# Patient Record
Sex: Female | Born: 1935 | Race: White | Hispanic: No | Marital: Married | Smoking: Former smoker
Health system: Southern US, Community
[De-identification: ages and names within clinical notes are randomized; demographics above are authoritative.]

## PROBLEM LIST (undated history)

## (undated) DIAGNOSIS — K219 Gastro-esophageal reflux disease without esophagitis: Secondary | ICD-10-CM

## (undated) DIAGNOSIS — E559 Vitamin D deficiency, unspecified: Secondary | ICD-10-CM

## (undated) DIAGNOSIS — M7989 Other specified soft tissue disorders: Secondary | ICD-10-CM

## (undated) DIAGNOSIS — I10 Essential (primary) hypertension: Secondary | ICD-10-CM

## (undated) DIAGNOSIS — N83209 Unspecified ovarian cyst, unspecified side: Secondary | ICD-10-CM

## (undated) DIAGNOSIS — I82409 Acute embolism and thrombosis of unspecified deep veins of unspecified lower extremity: Secondary | ICD-10-CM

## (undated) DIAGNOSIS — K859 Acute pancreatitis without necrosis or infection, unspecified: Secondary | ICD-10-CM

## (undated) DIAGNOSIS — R195 Other fecal abnormalities: Secondary | ICD-10-CM

## (undated) DIAGNOSIS — F419 Anxiety disorder, unspecified: Secondary | ICD-10-CM

## (undated) DIAGNOSIS — E785 Hyperlipidemia, unspecified: Secondary | ICD-10-CM

## (undated) DIAGNOSIS — E119 Type 2 diabetes mellitus without complications: Secondary | ICD-10-CM

## (undated) DIAGNOSIS — E039 Hypothyroidism, unspecified: Secondary | ICD-10-CM

## (undated) DIAGNOSIS — M810 Age-related osteoporosis without current pathological fracture: Secondary | ICD-10-CM

## (undated) DIAGNOSIS — C50919 Malignant neoplasm of unspecified site of unspecified female breast: Secondary | ICD-10-CM

## (undated) DIAGNOSIS — I2510 Atherosclerotic heart disease of native coronary artery without angina pectoris: Secondary | ICD-10-CM

## (undated) DIAGNOSIS — M1990 Unspecified osteoarthritis, unspecified site: Secondary | ICD-10-CM

## (undated) DIAGNOSIS — E7800 Pure hypercholesterolemia, unspecified: Secondary | ICD-10-CM

## (undated) DIAGNOSIS — I350 Nonrheumatic aortic (valve) stenosis: Secondary | ICD-10-CM

## (undated) DIAGNOSIS — Z951 Presence of aortocoronary bypass graft: Secondary | ICD-10-CM

## (undated) DIAGNOSIS — I672 Cerebral atherosclerosis: Secondary | ICD-10-CM

## (undated) HISTORY — DX: Gastro-esophageal reflux disease without esophagitis: K21.9

## (undated) HISTORY — DX: Nonrheumatic aortic (valve) stenosis: I35.0

## (undated) HISTORY — PX: AORTIC VALVE REPLACEMENT: SHX41

## (undated) HISTORY — DX: Other specified soft tissue disorders: M79.89

## (undated) HISTORY — DX: Cerebral atherosclerosis: I67.2

## (undated) HISTORY — DX: Age-related osteoporosis without current pathological fracture: M81.0

## (undated) HISTORY — DX: Other fecal abnormalities: R19.5

## (undated) HISTORY — DX: Unspecified ovarian cyst, unspecified side: N83.209

## (undated) HISTORY — DX: Unspecified osteoarthritis, unspecified site: M19.90

## (undated) HISTORY — DX: Anxiety disorder, unspecified: F41.9

## (undated) HISTORY — DX: Atherosclerotic heart disease of native coronary artery without angina pectoris: I25.10

## (undated) HISTORY — DX: Pure hypercholesterolemia, unspecified: E78.00

## (undated) HISTORY — DX: Vitamin D deficiency, unspecified: E55.9

## (undated) HISTORY — DX: Acute embolism and thrombosis of unspecified deep veins of unspecified lower extremity: I82.409

## (undated) HISTORY — PX: LAPAROTOMY: SHX154

## (undated) HISTORY — DX: Essential (primary) hypertension: I10

## (undated) HISTORY — DX: Malignant neoplasm of unspecified site of unspecified female breast: C50.919

## (undated) HISTORY — DX: Hyperlipidemia, unspecified: E78.5

## (undated) HISTORY — DX: Type 2 diabetes mellitus without complications: E11.9

## (undated) HISTORY — PX: HIP ARTHROPLASTY: SHX981

## (undated) HISTORY — DX: Acute pancreatitis without necrosis or infection, unspecified: K85.90

## (undated) HISTORY — DX: Presence of aortocoronary bypass graft: Z95.1

## (undated) HISTORY — DX: Hypothyroidism, unspecified: E03.9

---

## 1977-05-11 HISTORY — PX: OTHER SURGICAL HISTORY: SHX169

## 1995-10-25 DIAGNOSIS — Z951 Presence of aortocoronary bypass graft: Secondary | ICD-10-CM

## 1995-10-25 HISTORY — DX: Presence of aortocoronary bypass graft: Z95.1

## 1995-10-25 HISTORY — PX: CORONARY ARTERY BYPASS GRAFT: SHX141

## 1997-08-20 ENCOUNTER — Other Ambulatory Visit: Admission: RE | Admit: 1997-08-20 | Discharge: 1997-08-20 | Payer: Self-pay | Admitting: Obstetrics and Gynecology

## 1998-09-05 ENCOUNTER — Other Ambulatory Visit: Admission: RE | Admit: 1998-09-05 | Discharge: 1998-09-05 | Payer: Self-pay | Admitting: Obstetrics and Gynecology

## 2000-08-12 ENCOUNTER — Encounter: Admission: RE | Admit: 2000-08-12 | Discharge: 2000-11-10 | Payer: Self-pay | Admitting: *Deleted

## 2000-08-18 ENCOUNTER — Encounter: Admission: RE | Admit: 2000-08-18 | Discharge: 2000-11-16 | Payer: Self-pay | Admitting: *Deleted

## 2000-11-30 ENCOUNTER — Other Ambulatory Visit: Admission: RE | Admit: 2000-11-30 | Discharge: 2000-11-30 | Payer: Self-pay | Admitting: Obstetrics and Gynecology

## 2001-03-21 ENCOUNTER — Encounter: Payer: Self-pay | Admitting: Cardiology

## 2001-03-21 ENCOUNTER — Ambulatory Visit (HOSPITAL_COMMUNITY): Admission: RE | Admit: 2001-03-21 | Discharge: 2001-03-21 | Payer: Self-pay | Admitting: Cardiology

## 2002-12-18 ENCOUNTER — Other Ambulatory Visit: Admission: RE | Admit: 2002-12-18 | Discharge: 2002-12-18 | Payer: Self-pay | Admitting: Internal Medicine

## 2004-06-13 HISTORY — PX: OTHER SURGICAL HISTORY: SHX169

## 2006-08-11 ENCOUNTER — Encounter: Admission: RE | Admit: 2006-08-11 | Discharge: 2006-08-11 | Payer: Self-pay | Admitting: Orthopedic Surgery

## 2010-01-16 ENCOUNTER — Encounter: Admission: RE | Admit: 2010-01-16 | Discharge: 2010-01-16 | Payer: Self-pay | Admitting: Cardiology

## 2012-03-24 ENCOUNTER — Other Ambulatory Visit (HOSPITAL_COMMUNITY): Payer: Self-pay | Admitting: *Deleted

## 2012-03-24 DIAGNOSIS — I359 Nonrheumatic aortic valve disorder, unspecified: Secondary | ICD-10-CM

## 2012-04-21 ENCOUNTER — Ambulatory Visit (HOSPITAL_COMMUNITY): Payer: Self-pay

## 2012-04-22 ENCOUNTER — Ambulatory Visit (HOSPITAL_COMMUNITY)
Admission: RE | Admit: 2012-04-22 | Discharge: 2012-04-22 | Disposition: A | Discharge status: Home / Self Care | Payer: Medicare Other | Source: Ambulatory Visit | Attending: Cardiovascular Disease | Admitting: Cardiovascular Disease

## 2012-04-22 DIAGNOSIS — I059 Rheumatic mitral valve disease, unspecified: Secondary | ICD-10-CM | POA: Insufficient documentation

## 2012-04-22 DIAGNOSIS — I2510 Atherosclerotic heart disease of native coronary artery without angina pectoris: Secondary | ICD-10-CM | POA: Insufficient documentation

## 2012-04-22 DIAGNOSIS — E785 Hyperlipidemia, unspecified: Secondary | ICD-10-CM | POA: Insufficient documentation

## 2012-04-22 DIAGNOSIS — I872 Venous insufficiency (chronic) (peripheral): Secondary | ICD-10-CM | POA: Insufficient documentation

## 2012-04-22 DIAGNOSIS — I079 Rheumatic tricuspid valve disease, unspecified: Secondary | ICD-10-CM | POA: Insufficient documentation

## 2012-04-22 DIAGNOSIS — I359 Nonrheumatic aortic valve disorder, unspecified: Secondary | ICD-10-CM | POA: Insufficient documentation

## 2012-04-22 DIAGNOSIS — I517 Cardiomegaly: Secondary | ICD-10-CM | POA: Insufficient documentation

## 2012-04-22 NOTE — Progress Notes (Signed)
[facility]   2D echo completed 04/22/2012.   Cindy Nayab Aten, RDCS   

## 2012-05-16 ENCOUNTER — Encounter (HOSPITAL_COMMUNITY): Payer: Self-pay | Admitting: Pharmacy Technician

## 2012-05-23 ENCOUNTER — Encounter (HOSPITAL_COMMUNITY)
Admission: RE | Disposition: A | Discharge status: Home / Self Care | Payer: Self-pay | Source: Ambulatory Visit | Attending: Cardiovascular Disease

## 2012-05-23 ENCOUNTER — Ambulatory Visit (HOSPITAL_COMMUNITY)
Admission: RE | Admit: 2012-05-23 | Discharge: 2012-05-23 | Disposition: A | Discharge status: Home / Self Care | Payer: Medicare Other | Source: Ambulatory Visit | Attending: Cardiovascular Disease | Admitting: Cardiovascular Disease

## 2012-05-23 DIAGNOSIS — I2789 Other specified pulmonary heart diseases: Secondary | ICD-10-CM | POA: Insufficient documentation

## 2012-05-23 DIAGNOSIS — I2510 Atherosclerotic heart disease of native coronary artery without angina pectoris: Secondary | ICD-10-CM | POA: Insufficient documentation

## 2012-05-23 DIAGNOSIS — I359 Nonrheumatic aortic valve disorder, unspecified: Secondary | ICD-10-CM | POA: Insufficient documentation

## 2012-05-23 DIAGNOSIS — I25810 Atherosclerosis of coronary artery bypass graft(s) without angina pectoris: Secondary | ICD-10-CM | POA: Insufficient documentation

## 2012-05-23 HISTORY — PX: LEFT AND RIGHT HEART CATHETERIZATION WITH CORONARY/GRAFT ANGIOGRAM: SHX5448

## 2012-05-23 LAB — POCT I-STAT 3, ART BLOOD GAS (G3+)
Acid-Base Excess: 1 mmol/L (ref 0.0–2.0)
O2 Saturation: 90 %
pO2, Arterial: 58 mmHg — ABNORMAL LOW (ref 80.0–100.0)

## 2012-05-23 LAB — POCT I-STAT 3, VENOUS BLOOD GAS (G3P V)
Acid-Base Excess: 1 mmol/L (ref 0.0–2.0)
Acid-base deficit: 1 mmol/L (ref 0.0–2.0)
O2 Saturation: 62 %
O2 Saturation: 73 %
pO2, Ven: 39 mmHg (ref 30.0–45.0)

## 2012-05-23 LAB — GLUCOSE, CAPILLARY
Glucose-Capillary: 94 mg/dL (ref 70–99)
Glucose-Capillary: 82 mg/dL (ref 70–99)

## 2012-05-23 LAB — PROTIME-INR
INR: 0.98 (ref 0.00–1.49)
Prothrombin Time: 12.9 seconds (ref 11.6–15.2)

## 2012-05-23 SURGERY — LEFT AND RIGHT HEART CATHETERIZATION WITH CORONARY/GRAFT ANGIOGRAM
Anesthesia: LOCAL

## 2012-05-23 MED ORDER — FENTANYL CITRATE 0.05 MG/ML IJ SOLN
INTRAMUSCULAR | Status: AC
  Filled 2012-05-23: qty 2

## 2012-05-23 MED ORDER — DIAZEPAM 5 MG PO TABS
5.0000 mg | ORAL_TABLET | Freq: Once | ORAL | Status: AC
  Administered 2012-05-23: 5 mg via ORAL

## 2012-05-23 MED ORDER — SODIUM CHLORIDE 0.9 % IJ SOLN: 3.0000 mL | INTRAMUSCULAR | Status: DC | PRN

## 2012-05-23 MED ORDER — HEPARIN (PORCINE) IN NACL 2-0.9 UNIT/ML-% IJ SOLN
INTRAMUSCULAR | Status: AC
  Filled 2012-05-23: qty 1000

## 2012-05-23 MED ORDER — ASPIRIN 81 MG PO CHEW
324.0000 mg | CHEWABLE_TABLET | ORAL | Status: AC
  Administered 2012-05-23: 324 mg via ORAL
  Filled 2012-05-23: qty 4

## 2012-05-23 MED ORDER — NITROGLYCERIN 0.2 MG/ML ON CALL CATH LAB
INTRAVENOUS | Status: AC
  Filled 2012-05-23: qty 1

## 2012-05-23 MED ORDER — DIAZEPAM 5 MG PO TABS
ORAL_TABLET | ORAL | Status: AC
  Filled 2012-05-23: qty 1

## 2012-05-23 MED ORDER — LIDOCAINE HCL (PF) 1 % IJ SOLN
INTRAMUSCULAR | Status: AC
  Filled 2012-05-23: qty 30

## 2012-05-23 MED ORDER — MIDAZOLAM HCL 2 MG/2ML IJ SOLN
INTRAMUSCULAR | Status: AC
  Filled 2012-05-23: qty 2

## 2012-05-23 MED ORDER — SODIUM CHLORIDE 0.9 % IV SOLN
INTRAVENOUS | Status: DC
  Administered 2012-05-23: 75 mL/h via INTRAVENOUS

## 2012-05-23 NOTE — CV Procedure (Signed)
Becky Mcmahon, [place] Female, 79 y.o., 1931/05/02  Location: [facility]  Bed: NONE  MRN: [id number]  CSN: [id number]  Admit Dt: 05/23/12  CARDIAC CATHETERIZATION REPORT   Procedures performed:  1. Left heart catheterization  2. Selective coronary angiography  3. Left ventriculography   Reason for procedure:  Severe aortic stenosis Coronary artery disease status post previous coronary bypass surgery  Procedure performed by: Thurmon Fair, MD, [facility]  Complications: none   Estimated blood loss: less than 5 mL   History:  The patient is a 79 year old woman who is now roughly 16 years status post multivessel coronary bypass surgery and presents with new onset dyspnea on exertion. She is found to have severe aortic stenosis by echocardiography.  Consent: The risks, benefits, and details of the procedure were explained to the patient. Risks including death, MI, stroke, bleeding, limb ischemia, renal failure and allergy were described and accepted by the patient. Informed written consent was obtained prior to proceeding.  Technique: The patient was brought to the cardiac catheterization laboratory in the fasting state. She was prepped and draped in the usual sterile fashion. Local anesthesia with 1% lidocaine was administered to the right groin area. Using the modified Seldinger technique a 5 French right common femoral artery sheath was introduced without difficulty. In a similar fashion a 7 [place] right common femoral vein sheath was also introduced. A 7 [place] balloontipped Swan-Ganz catheter was advanced under fluoroscopic guidance to the pulmonary artery and used to record pressures in the wedge position, main pulmonary artery, right ventricle and right atrium, respectively as well as to obtain blood samples for oxygen saturation from the pulmonary artery and right atrium. A synchronous sample of blood for oxygen saturation was obtained from the right femoral sheath Under fluoroscopic  guidance, using 5 [place] JL4 and JR  selective cannulation of the left coronary artery, right coronary artery, saphenous vein graft bypass to the right coronary artery, saphenous vein graft bypass to the oblique marginal artery , left internal mammary artery bypass to the LAD artery were respectively performed. Several coronary angiograms in a variety of projections were recorded. The aortic valve was crossed with the JR catheter and a straight tipped wire and a hand injection of the left ventricle is performed. Left ventricular pressure and a pull back to the aorta were recorded. No immediate complications occurred. At the end of the procedure, all catheters were removed. After the procedure, hemostasis will be achieved with manual pressure.  Contrast used: 85 mL Omnipaque  Hemodynamic findings:  Aortic pressure 140/67 (mean 97) mm Hg Left ventricle 194/13 with end-diastolic pressure of 20 mm Hg Pulmonary artery wedge position A wave 18, V wave 19, mean 15 mm Hg Pulmonary artery 41/17 (mean 27) mm Hg Right ventricle 39/6 with an end-diastolic pressure of 11 mm Hg Right atrium A-wave 13 V-wave 13 mean 11 mm Hg  Cardiac output is 4.42 L per minute (cardiac index 2.44 L per minute per meter sq) by the Fick equation. Cardiac output is 5.36 L per minute (cardiac index 2.96 L per minute per meter sq) by thermodilution  Aortic saturation 90%, PA saturation 62%.  By the Gorlin equation , the calculated aortic valve area 0.86 cm by the Fick equation, 0.71 cm square using thermodilution. Index for body surface area distress lates into 8 aortic valve area of 0.39-0.47 cm square per meter square body surface area. The peak to peak transvalvular gradient is 51 mm Hg, the mean transvalvular gradient is 35 mm Hg but these  values are probably underestimated due to the pullback technique.  The pulmonary vascular resistance is 179 DSC (324 DSCI).  Angiographic Findings:  1. The left main coronary artery is  heavily calcified and moderately diffusely diseased and bifurcates in the usual fashion into the left anterior descending artery and left circumflex coronary artery.  2. The left anterior descending artery is totally occluded following a very small first septal artery. There is evidence of extensive luminal irregularities and heavy calcification. The distal LAD artery fills via a left internal mammary artery bypass to provide both antegrade and retrograde flow only one major diagonal artery is seen filling in a retrograde fashion from the proximal LAD. Although the LAD artery is diffusely diseased, no discrete hemodynamically significant stenoses are seen. 3. The left circumflex coronary artery is a large-size vessel non- dominant vessel that generates three major oblique marginal arteries. There is evidence of extensive luminal irregularities and heavy calcification. A lengthy 70% proximal stenosis is seen. The first oblique marginal arteries by far the largest and receives most of its flow via a saphenous vein graft bypass. There is a 50% stenosis in the first oblique marginal artery but there is excellent retrograde flow into the remainder of the left circumflex system during injection of the vein graft. There is an approximately 50% stenosis in the AV groove vessel between the first and second oblique marginal arteries 4. The right coronary artery is a large-size dominant vessel that generates that is severely diffusely diseased and is totally occluded in its midportion. There is evidence of extensive luminal irregularities and heavy calcification. The posterior descending artery and posterolateral ventricular artery branches of the distal RCA filled via the saphenous vein graft bypass. Although they exhibit diffuse atherosclerosis that widely patent and of excellent flow. There is also retrograde flow to the mid distal native right coronary artery which fills a large right ventricular marginal branch  5.  the saphenous vein graft bypass to the right coronary artery shows excellent flow. It has a proximal 70% irregular stenosis but the remainder of the graft shows only mild luminal irregularities. 6. the saphenous vein graft bypass to the oblique marginal artery is widely patent shows excellent flow and it has mild to moderate luminal irregularities but no significant stenoses. 7. the left internal mammary artery bypass to the LAD artery is a excellent healthy-appearing conduit with brisk flow. It is relatively tortuous and approaches the left border of the sternum in its top third but does not appear to cross behind the sternum. 8. The left ventricle is normal in size. The left ventricle systolic function is normal with an estimated ejection fraction of 65-70 %. Regional wall motion abnormalities are not seen. No left ventricular thrombus is seen. There is no mitral insufficiency seen during the hand injection left ventriculogram. The ascending aorta appears normal. There is severe aortic valve stenosis by pullback. The aortic and left ventricular waveform showed a delayed peak    IMPRESSIONS:  Severe aortic stenosis. Severe coronary disease of the native coronary arteries with total occlusion of the LAD and right coronary arteries and high-grade stenosis of the proximal left circumflex coronary artery. Severe stenosis of the proximal saphenous vein graft to the right coronary artery but no significant stenoses in the other 2 bypass grafts. Mild pulmonary arterial hypertension.  RECOMMENDATION:  Mrs. Becky Mcmahon has symptomatic severe aortic stenosis with preserved left extrasystolic function and should undergo replacement of her aortic valve with a biological prosthesis.  She seems to be an appropriate  candidate for either surgical aortic valve replacement or a transaortic valvular replacement.  The latter approach is appealing in view of her age and the fact that it would provide optimal preservation of  her bypasses. The stenosis in the proximal saphenous vein graft to right coronary artery could then be treated with percutaneous revascularization and stenting.  On the other hand her functional status is good and the LIMA bypass seems to be relatively far from the midsternal line so that she may also be considered for surgical treatment . We'll refer Ms. Minturn to the valve clinic to discuss these options with Dr. Calton Dach and Dr. Tressie Stalker.     Thurmon Fair, MD, [facility] [phone] office [phone] pager 05/23/2012 12:36 PM

## 2012-05-23 NOTE — H&P (Addendum)
Date of Initial H&P: 05/16/2012  History reviewed, patient examined, no change in status, stable for surgery. Severe aortic stenosis, here for right and left heart catheterization with coronary angio and bypass graft angiography. This procedure has been fully reviewed with the patient and written informed consent has been obtained. Thurmon Fair, MD, [facility] [phone] office [phone] pager 05/23/2012 10:56 AM

## 2012-05-24 ENCOUNTER — Institutional Professional Consult (permissible substitution) (INDEPENDENT_AMBULATORY_CARE_PROVIDER_SITE_OTHER): Payer: Medicare Other | Admitting: Cardiothoracic Surgery

## 2012-05-24 ENCOUNTER — Other Ambulatory Visit: Payer: Self-pay

## 2012-05-24 ENCOUNTER — Ambulatory Visit: Payer: Medicare Other

## 2012-05-24 ENCOUNTER — Other Ambulatory Visit: Payer: Self-pay | Admitting: *Deleted

## 2012-05-24 VITALS — BP 113/65 | HR 77 | Resp 16 | Ht 65.0 in | Wt 163.0 lb

## 2012-05-24 DIAGNOSIS — Z951 Presence of aortocoronary bypass graft: Secondary | ICD-10-CM

## 2012-05-24 DIAGNOSIS — I359 Nonrheumatic aortic valve disorder, unspecified: Secondary | ICD-10-CM

## 2012-05-24 DIAGNOSIS — I2510 Atherosclerotic heart disease of native coronary artery without angina pectoris: Secondary | ICD-10-CM

## 2012-05-24 DIAGNOSIS — I350 Nonrheumatic aortic (valve) stenosis: Secondary | ICD-10-CM | POA: Insufficient documentation

## 2012-05-24 NOTE — Progress Notes (Signed)
PCP is Hoyle Sauer, MD Referring Provider is Croitoru, Rachelle Hora, MD  Chief Complaint  Patient presents with  . Aortic Stenosis    Eval for AVR, Cardiac Cath 05/23/12, Echo 04/22/12   . Coronary Artery Disease    HPI: 79-1/77[age]year-old Caucasian female with class III symptoms of CHF-shortness of breath following a large meal and walking in the cold weather cause symptoms- and recent diagnosis of severe aortic stenosis with a peak gradient by echo of 80 mm mercury and a calculated valve area of 0.5. Normal LV systolic function and no significant mitral valve disease. Maintaining normal sinus rhythm. The patient had multivessel bypass grafting in 1997 and has no symptoms recurrent angina. Cardiac catheterization completed for evaluation of her aortic stenosis demonstrated patent mammary and vein grafts with a proximal 70% narrowing of the vein graft to the occluded right coronary artery.  The patient's cardiologist wished that the patient  be evaluated for aortic valve replacement either with a conventional open sternotomy versus transarterial valve replacement-T. a VR. The patient denies any resting symptoms orthopnea PND or edema.  Since the patient's CABG in 1997 she has had DVT in both lower extremities and is on permanent Coumadin without evidence of disease. There is been no history of pulmonary emboli. She's had a broken hip-left hip. She has had no thoracic trauma. She has no history of significant peripheral vascular disease. She has had no significant problems with her chronic Coumadin therapy Septra 1 significant episode of epistaxis which required an emergency room visit and an evaluation by ENT which was nonrevealing. The patient lives a fairly active lifestyle with her husband. She has a remote history of breast cancer status post right mastectomy. Spirometry performed an office today indicates FEV1 of 1.6 and FVC of 3.3 both 90 percent predicted.  The patient has full dental plates, she  is edentulous, she denies any oral symptoms.    Past Medical History  Diagnosis Date  . Severe aortic stenosis 05/24/2012  . CAD (coronary artery disease) 05/24/2012  . Hyperlipidemia   . Osteoporosis   . Vitamin D deficiency   . Hypothyroidism   . GERD (gastroesophageal reflux disease)   . Hypertension   . Anxiety   . DVT (deep venous thrombosis)   . Pancreatitis   . Ovarian cyst   . Breast cancer     Rt mastectomy  . Guaiac positive stools     Past Surgical History  Procedure Date  . Coronary artery bypass graft 10/25/1995    x5 , Dr Donata Clay  . Right mastectomy 1979  . Laparotomy     Family History  Problem Relation Age of Onset  . Pancreatitis Mother   . Heart disease      family HX    Social History History  Substance Use Topics  . Smoking status: Former Smoker -- 0.5 packs/day for 19 years    Quit date: 05/24/1969  . Smokeless tobacco: Never Used  . Alcohol Use: No    Current Outpatient Prescriptions  Medication Sig Dispense Refill  . alendronate (FOSAMAX) 70 MG tablet Take 70 mg by mouth every 7 (seven) days. On Mondays; Take with a full glass of water on an empty stomach.      . Ascorbic Acid (VITAMIN C) 1000 MG tablet Take 1,000 mg by mouth daily.      Marland Kitchen aspirin EC 81 MG tablet Take 81 mg by mouth daily.      Marland Kitchen atorvastatin (LIPITOR) 20 MG tablet Take 20 mg  by mouth at bedtime.      . Calcium Carbonate-Vitamin D (CALCIUM 600 + D PO) Take 1 tablet by mouth daily.      . Cholecalciferol (VITAMIN D3) 2000 UNITS TABS Take 1 tablet by mouth daily.      Marland Kitchen enoxaparin (LOVENOX) 80 MG/0.8ML injection       . esomeprazole (NEXIUM) 40 MG capsule Take 40 mg by mouth daily before breakfast.      . levothyroxine (SYNTHROID, LEVOTHROID) 50 MCG tablet Take 50 mcg by mouth daily.      Marland Kitchen lisinopril (PRINIVIL,ZESTRIL) 10 MG tablet Take 10 mg by mouth daily.      Marland Kitchen LORazepam (ATIVAN) 0.5 MG tablet Take 0.25-0.5 mg by mouth 2 (two) times daily. For anxiety      .  metroNIDAZOLE (METROCREAM) 0.75 % cream Apply 1 application topically daily.      . Multiple Vitamin (MULTIVITAMIN WITH MINERALS) TABS Take 1 tablet by mouth daily.      Marland Kitchen warfarin (COUMADIN) 5 MG tablet Take 5 mg by mouth daily. Or as directed        Allergies  Allergen Reactions  . Other     Band Aids    Review of Systems Constitutional stable weight no fever no recent respiratory infection HEENT edentulous, no swallowing difficulty Thoracic no history of pulmonary nodule or abnormal chest x-ray Cardiac loud cardiac murmur class III symptoms of aortic stenosis-of CHF GI no history of GI bleeding jaundice hepatitis Endocrine negative for diabetes positive for elevated triglycerides Vascular no history of claudication positive history for bilateral leg DVT Neurologic no history stroke, TIA, syncope  BP 113/65  Pulse 77  Resp 16  Ht 5\' 5"  (1.651 m)  Wt 163 lb (73.936 kg)  BMI 27.12 kg/m2  SpO2 97% Physical Exam Gen. elderly but fairly bright alert and engaged HEENT normocephalic pupils equal edentulous Neck 1-2+ carotid pulses no JVD no bruit no adenopathy Thorax right mastectomy scar otherwise no deformity well-healed sternotomy breath sounds clear Cardiac grade 4/6 systolic ejection murmur regular rhythm no diastolic murmur Abdomen soft obese nontender without pulsatile mass Extremities no clubbing cyanosis or edema Vascular superficial venous changes, multiple vein harvest scars both legs minimal pedal edema 1+ pedal 2+ radial pulses Neurologic stage gait no focal motor deficit  Diagnostic Tests: Cardiac catheterization reviewed Transthoracic 2-D echocardiogram reviewed  Patent vein grafts left IMA to LAD, sequential saphenous vein graft to ramus intermediate and obtuse marginal, sequential vein graft to distal RCA and posterior descending Heavily calcified restrictive aortic valve, no clear heavy calcification of the ascending aorta  Impression:Plan The patient would  benefit from aortic valve replacement with probable combined-relation of the vein graft to the RCA. Even though the patient's overall functional level appears to be quite good for her age, redo sternotomy with combined aVR CABG in an 79 year old is an extremely high-risk procedure and by STS data exceeds 20%. I recommended patient undergo evaluation for T. aVR and she is also very interested as is her family. She will be referred to the T. aVR clinic and return here as needed

## 2012-05-26 ENCOUNTER — Ambulatory Visit (HOSPITAL_COMMUNITY)
Admission: RE | Admit: 2012-05-26 | Discharge: 2012-05-26 | Disposition: A | Discharge status: Home / Self Care | Payer: Medicare Other | Source: Ambulatory Visit | Attending: Thoracic Surgery (Cardiothoracic Vascular Surgery) | Admitting: Thoracic Surgery (Cardiothoracic Vascular Surgery)

## 2012-05-26 ENCOUNTER — Inpatient Hospital Stay (HOSPITAL_COMMUNITY)
Admission: RE | Admit: 2012-05-26 | Discharge: 2012-05-26 | Disposition: A | Discharge status: Home / Self Care | Payer: Medicare Other | Source: Ambulatory Visit

## 2012-05-26 DIAGNOSIS — I359 Nonrheumatic aortic valve disorder, unspecified: Secondary | ICD-10-CM | POA: Insufficient documentation

## 2012-05-26 DIAGNOSIS — Z01811 Encounter for preprocedural respiratory examination: Secondary | ICD-10-CM | POA: Insufficient documentation

## 2012-05-26 DIAGNOSIS — Z01818 Encounter for other preprocedural examination: Secondary | ICD-10-CM | POA: Insufficient documentation

## 2012-05-26 LAB — PULMONARY FUNCTION TEST

## 2012-05-26 MED ORDER — ALBUTEROL SULFATE (5 MG/ML) 0.5% IN NEBU
2.5000 mg | INHALATION_SOLUTION | Freq: Once | RESPIRATORY_TRACT | Status: AC
  Administered 2012-05-26: 2.5 mg via RESPIRATORY_TRACT

## 2012-05-27 ENCOUNTER — Encounter: Payer: Self-pay | Admitting: Thoracic Surgery (Cardiothoracic Vascular Surgery)

## 2012-05-27 ENCOUNTER — Institutional Professional Consult (permissible substitution) (INDEPENDENT_AMBULATORY_CARE_PROVIDER_SITE_OTHER): Payer: Medicare Other | Admitting: Thoracic Surgery (Cardiothoracic Vascular Surgery)

## 2012-05-27 VITALS — BP 128/73 | HR 90 | Resp 18 | Ht 65.0 in | Wt 163.0 lb

## 2012-05-27 DIAGNOSIS — E785 Hyperlipidemia, unspecified: Secondary | ICD-10-CM | POA: Insufficient documentation

## 2012-05-27 DIAGNOSIS — M1990 Unspecified osteoarthritis, unspecified site: Secondary | ICD-10-CM | POA: Insufficient documentation

## 2012-05-27 DIAGNOSIS — Z951 Presence of aortocoronary bypass graft: Secondary | ICD-10-CM

## 2012-05-27 DIAGNOSIS — I350 Nonrheumatic aortic (valve) stenosis: Secondary | ICD-10-CM

## 2012-05-27 DIAGNOSIS — I82409 Acute embolism and thrombosis of unspecified deep veins of unspecified lower extremity: Secondary | ICD-10-CM | POA: Insufficient documentation

## 2012-05-27 DIAGNOSIS — I359 Nonrheumatic aortic valve disorder, unspecified: Secondary | ICD-10-CM | POA: Insufficient documentation

## 2012-05-27 DIAGNOSIS — E11622 Type 2 diabetes mellitus with other skin ulcer: Secondary | ICD-10-CM | POA: Insufficient documentation

## 2012-05-27 DIAGNOSIS — I2510 Atherosclerotic heart disease of native coronary artery without angina pectoris: Secondary | ICD-10-CM

## 2012-05-27 DIAGNOSIS — I10 Essential (primary) hypertension: Secondary | ICD-10-CM | POA: Insufficient documentation

## 2012-05-27 NOTE — Progress Notes (Signed)
[facility]  MULTIDISCIPLINARY HEART VALVE CLINIC    CARDIOTHORACIC SURGERY CONSULTATION REPORT  Referring Provider is Thurmon Fair, MD PCP is Hoyle Sauer, MD  Chief Complaint  Patient presents with  . Aortic Stenosis    EVAL FOR TAVR, SEVERE AS    HPI:  Patient is a 79 year old married white female from [place] with long-standing history of aortic stenosis, coronary artery disease, hypertension, diet controlled type 2 diabetes mellitus, and arthritis. She underwent coronary artery bypass grafting x5 by Dr. Donata Clay in 1997 for severe 3-vessel CAD.  Postoperatively she did well from a cardiovascular standpoint although she did develop lower extremity deep venous thrombosis and pancreatitis during the first several months following her surgery. She later developed a second deep venous thrombosis in 2001 following hip surgery, and since then she has been treated with Coumadin ever since. From a cardiovascular standpoint the patient has done well. She has developed aortic stenosis which has been followed carefully over several years with repeat echocardiograms. Over the past year the patient has developed progressive symptoms of exertional shortness of breath and fatigue without chest pain. Followup echocardiogram demonstrates severe calcific aortic stenosis with peak velocity across the aortic valve measured close to 5 m/sec and peak and mean transvalvular gradients estimated 98 and 68 mmHg respectively. Left ventricular function is fairly well preserved with ejection fraction estimated 60 to 65% by echocardiogram.  Cardiac catheterization was performed 05/23/2012 and notable for the presence of severe three-vessel coronary artery disease but all previous bypass grafts remained patent. There is 70% proximal stenosis of the vein graft to right coronary system, but all other grafts are widely patent and free of significant disease.  Peak to peak and mean pullback gradients  across the aortic valve were measured 51 mmHg and 35 mmHg, respectively.  Pulmonary artery pressures measured 41/17 and calculated aortic valve area was estimated between 0.71 and 0.86 cm.  The patient was referred to consider treatment options and has been seen in consultation earlier this week by Dr. Donata Clay who feels that she would be a relatively poor candidate for conventional aortic valve replacement with or without redo coronary artery bypass grafting. The patient has been referred to consider transcatheter aortic valve replacement as an alternative.  The patient describes chronic symptoms of exertional shortness of breath that has gradually progressed over the past couple of years. She states that she now get short of breath with moderate physical activity, probably functional class II to class III.  The patient denies resting shortness of breath, PND, orthopnea, or lower extremity edema. She has never had any chest pain or chest tightness. She recently has had a few dizzy spells without syncope.  She also suffers from chronic arthritis, but she states that her physical activity is primarily limited by shortness of breath.  Past Medical History  Diagnosis Date  . Severe aortic stenosis   . CAD (coronary artery disease)   . Hyperlipidemia   . Osteoporosis   . Vitamin D deficiency   . Hypothyroidism   . GERD (gastroesophageal reflux disease)   . Hypertension   . Anxiety   . Pancreatitis   . Ovarian cyst   . Breast cancer     Rt mastectomy  . Guaiac positive stools   . S/P CABG x 5 10/25/1995    LIMA to LAD, sequential SVG to RI and OM, sequential SVG to RCA and PDA, open vein harvest from both thighs and legs - Dr Donata Clay  .  Type II diabetes mellitus     Diet controlled  . Arthritis   . Hypercholesteremia   . DVT (deep venous thrombosis)     Recurrent, 1st episode after CABG 1997, 2nd episode after hip surgery 2001, placed on life-long coumadin ever since    Past Surgical  History  Procedure Date  . Coronary artery bypass graft 10/25/1995    x5 , Dr Donata Clay  . Right mastectomy 1979  . Laparotomy   . Hip arthroplasty     right    Family History  Problem Relation Age of Onset  . Pancreatitis Mother   . Heart disease Father     family HX    History   Social History  . Marital Status: Married    Spouse Name: N/A    Number of Children: N/A  . Years of Education: N/A   Occupational History  . retired grade school teacher [employer]   Social History Main Topics  . Smoking status: Former Smoker -- 0.5 packs/day for 19 years    Quit date: 05/24/1969  . Smokeless tobacco: Never Used  . Alcohol Use: No  . Drug Use: Not on file  . Sexually Active: Not on file   Other Topics Concern  . Not on file   Social History Narrative   Lives with husband and 1 daughter, 2nd daughter [place] in [place] physically active and functionally independentActive in church    Current Outpatient Prescriptions  Medication Sig Dispense Refill  . alendronate (FOSAMAX) 70 MG tablet Take 70 mg by mouth every 7 (seven) days. On Mondays; Take with a full glass of water on an empty stomach.      . Ascorbic Acid (VITAMIN C) 1000 MG tablet Take 1,000 mg by mouth daily.      Marland Kitchen aspirin EC 81 MG tablet Take 81 mg by mouth daily.      Marland Kitchen atorvastatin (LIPITOR) 20 MG tablet Take 20 mg by mouth at bedtime.      . Calcium Carbonate-Vitamin D (CALCIUM 600 + D PO) Take 1 tablet by mouth daily.      . Cholecalciferol (VITAMIN D3) 2000 UNITS TABS Take 1 tablet by mouth daily.      Marland Kitchen enoxaparin (LOVENOX) 80 MG/0.8ML injection       . esomeprazole (NEXIUM) 40 MG capsule Take 40 mg by mouth daily before breakfast.      . levothyroxine (SYNTHROID, LEVOTHROID) 50 MCG tablet Take 50 mcg by mouth daily.      Marland Kitchen lisinopril (PRINIVIL,ZESTRIL) 10 MG tablet Take 10 mg by mouth daily.      Marland Kitchen LORazepam (ATIVAN) 0.5 MG tablet Take 0.25-0.5 mg by mouth 2 (two) times daily. For  anxiety      . metroNIDAZOLE (METROCREAM) 0.75 % cream Apply 1 application topically daily.      . Multiple Vitamin (MULTIVITAMIN WITH MINERALS) TABS Take 1 tablet by mouth daily.      Marland Kitchen warfarin (COUMADIN) 5 MG tablet Take 5 mg by mouth daily. Or as directed        Allergies  Allergen Reactions  . Other     Band Aids    Review of Systems:   General:  normal appetite, decreased energy, no weight gain/loss  HEENT:  no blurry vision, no recent vision changes, no epistaxis, no hearing loss, no loose teeth or painful teeth, full set dentures  Respiratory:  + exertional shortness of breath, no cough, no wheezing, no hemoptysis, no pain with inspiration or  cough  Cardiac:  no chest pain with/without exertion, SOB with moderate exertion, no resting SOB, no PND, no orthopnea, occasional LE edema, no palpitations, no arrhythmia, + dizzy spells, no syncope  Vascular:  no pain suggestive of claudication  GI:   no difficulty swallowing, + heartburn/reflux, no hematochezia, no hematemesis, no melena, + constipation, no diarrhea   GU:   no dysuria, no urgency, no frequency  Musculoskeletal: + arthritis, no myalgias, mobility mildly limited, minimal difficulty walking  Neuro:   no symptoms suggestive of TIA's, no seizures, no headaches, no peripheral neuropathy, no instability of gait, no memory/cognitive dysfunction  Endocrine:  + diet-controlled diabetes, + thyroid dysfunction  Hematologic:  + easy bruising, no anemia, no abnormal bleeding  Psych:   + anxiety, no depression     Physical Exam:   BP 128/73  Pulse 90  Resp 18  Ht 5\' 5"  (1.651 m)  Wt 163 lb (73.936 kg)  BMI 27.12 kg/m2  SpO2 97%  General:  Elderly but o/w well-appearing  HEENT:  Unremarkable   Neck:   no JVD, no bruits, no adenopathy   Chest:   clear to auscultation, symmetrical breath sounds, no wheezes, no rhonchi   CV:   RRR, grade III/VI crescendo/decrescendo murmur heard best at RUSB,  no diastolic  murmur  Abdomen:  soft, non-tender, no masses   Extremities:  warm, well-perfused, pulses palpable in groin but not at ankle, scars both thighs and lower legs from SVG harvest, + varicose veins, no LE edema  Rectal/GU  Deferred  Neuro:   Grossly non-focal and symmetrical throughout  Skin:   Clean and dry, no rashes, no breakdown   Diagnostic Tests:  Transthoracic Echocardiography  Patient:    Zahrah, Sutherlin MR #:       [id number] Study Date: 04/22/2012 Gender:     F Age:        82 Height:     165.1cm Weight:     75.8kg BSA:        1.3m^2 Pt. Status: Room:    SONOGRAPHER  Rigg, Aram Beecham  ATTENDING    Croitoru, Mihai  ORDERING     Croitoru, Mihai  REFERRING    Croitoru, Mihai  PERFORMING   [facility] cc:  ------------------------------------------------------------ LV EF: 60% -   65%  ------------------------------------------------------------ Indications:      Aortic stenosis 424.1.  ------------------------------------------------------------ History:   PMH:   Coronary artery disease.  Aortic valve disease.  Risk factors:  Peripheral venous insufficiency. Dyslipidemia.  ------------------------------------------------------------ Study Conclusions  - Left ventricle: The cavity size was normal. There was   moderate concentric hypertrophy. Systolic function was   normal. The estimated ejection fraction was in the range   of 60% to 65%. Wall motion was normal; there were no   regional wall motion abnormalities. Doppler parameters are   consistent with abnormal left ventricular relaxation   (grade 1 diastolic dysfunction). The E/e'' ratio is >10,   suggesting elevated LV filling pressure. - Aortic valve: Heavily calcified with restricted leaflet   motion. Severe aortic stenosis. Peak and mean gradients of   98 mmHg and 68 mmHG, respectively. Based on an LVOT   diameter of 2.0 cm, the calculated AVA is ~0.5 cm2. There   is no significant AI. Valve area:  0.58cm^2(VTI). Valve   area: 0.54cm^2 (Vmax). - Mitral valve: Calcified annulus. Mild regurgitation. Valve   area by pressure half-time: 2.37cm^2. Valve area by   continuity equation (using LVOT flow): 1.94cm^2. - Left atrium: Severely  dilated. LA Volume/BSA 40.4 ml/m2. - Tricuspid valve: Mild regurgitation. - Pulmonary arteries: PA peak pressure: 29mm Hg (S). - Inferior vena cava: The vessel was normal in size; the   respirophasic diameter changes were in the normal range (=   50%); findings are consistent with normal central venous   pressure.  ------------------------------------------------------------ Labs, prior tests, procedures, and surgery: Coronary artery bypass grafting.  Transthoracic echocardiography.  M-mode, complete 2D, spectral Doppler, and color Doppler.  Height:  Height: 165.1cm. Height: 65in.  Weight:  Weight: 75.8kg. Weight: 166.7lb.  Body mass index:  BMI: 27.8kg/m^2.  Body surface area:    BSA: 1.72m^2.  Blood pressure:     136/78.  Patient status:  Outpatient.  Location:  Echo laboratory.  ------------------------------------------------------------  ------------------------------------------------------------ Left ventricle:  The cavity size was normal. There was moderate concentric hypertrophy. Systolic function was normal. The estimated ejection fraction was in the range of 60% to 65%. Wall motion was normal; there were no regional wall motion abnormalities. Doppler parameters are consistent with abnormal left ventricular relaxation (grade 1 diastolic dysfunction). The E/e'' ratio is >10, suggesting elevated LV filling pressure.  ------------------------------------------------------------ Aortic valve:  Heavily calcified with restricted leaflet motion. Severe aortic stenosis. Peak and mean gradients of 98 mmHg and 68 mmHG, respectively. Based on an LVOT diameter of 2.0 cm, the calculated AVA is ~0.5 cm2. There is no significant AI.  Doppler:      Valve area: 0.58cm^2(VTI). Indexed valve area: 0.32cm^2/m^2 (VTI). Valve area: 0.54cm^2 (Vmax). Indexed valve area: 0.3cm^2/m^2 (Vmax).    Mean gradient: 68mm Hg (S). Peak gradient: 98mm Hg (S).  ------------------------------------------------------------ Aorta:  Aortic root: The aortic root was normal in size. Ascending aorta: The ascending aorta was normal in size.  ------------------------------------------------------------ Mitral valve:   Calcified annulus.  Doppler:   Mild regurgitation.    Valve area by pressure half-time: 2.37cm^2. Indexed valve area by pressure half-time: 1.3cm^2/m^2. Valve area by continuity equation (using LVOT flow): 1.94cm^2. Indexed valve area by continuity equation (using LVOT flow): 1.06cm^2/m^2.    Mean gradient: 3mm Hg (D). Peak gradient: 6mm Hg (D).  ------------------------------------------------------------ Left atrium:  Severely dilated. LA Volume/BSA 40.4 ml/m2.   ------------------------------------------------------------ Atrial septum:  No defect or patent foramen ovale was identified.  ------------------------------------------------------------ Right ventricle:  The cavity size was normal. Wall thickness was normal. The moderator band was prominent. Systolic function was normal.  ------------------------------------------------------------ Pulmonic valve:    The valve appears to be grossly normal.  Doppler:   No significant regurgitation.  ------------------------------------------------------------ Tricuspid valve:   Doppler:   Mild regurgitation.  ------------------------------------------------------------ Pulmonary artery:   Poorly visualized.  ------------------------------------------------------------ Right atrium:  The atrium was normal in size.  ------------------------------------------------------------ Pericardium:  There was no pericardial  effusion.  ------------------------------------------------------------ Systemic veins: Inferior vena cava: The vessel was normal in size; the respirophasic diameter changes were in the normal range (= 50%); findings are consistent with normal central venous pressure.  ------------------------------------------------------------  2D measurements        Normal  Doppler measurements   Normal Left ventricle                 Main pulmonary LVID ED,   43.3 mm     43-52   artery chord,                         Pressure,    29 mm Hg  =30 PLAX  S LVID ES,   28.9 mm     23-38   Left ventricle chord,                         Ea, lat     8.3 cm/s   ------ PLAX                           ann, tiss FS, chord,   33 %      >29     DP PLAX                           E/Ea, lat  11.3        ------ LVPW, ED   13.3 mm     ------  ann, tiss IVS/LVPW   0.92        <1.3    DP ratio, ED                      Ea, med       8 cm/s   ------ Ventricular septum             ann, tiss IVS, ED    12.2 mm     ------  DP LVOT                           E/Ea, med  11.7        ------ Diam, S      20 mm     ------  ann, tiss     3 Area       3.14 cm^2   ------  DP Aorta                          Aortic valve Root diam,   32 mm     ------  Peak vel,   496 cm/s   ------ ED                             S Left atrium                    Mean vel,   397 cm/s   ------ AP dim       38 mm     ------  S AP dim     2.08 cm/m^2 <2.2    VTI, S      137 cm     ------ index                          Mean         68 mm Hg  ------                                gradient,                                S                                Peak  98 mm Hg  ------                                gradient,                                S                                Area, VTI  0.58 cm^2   ------                                Area index 0.32 cm^2/m ------                                (VTI)           ^2                                 Area, Vmax 0.54 cm^2   ------                                Area index  0.3 cm^2/m ------                                (Vmax)          ^2                                Mitral valve                                Peak E vel 93.8 cm/s   ------                                Peak A vel  120 cm/s   ------                                Mean vel,  75.7 cm/s   ------                                D                                Decelerati  271 ms     150-23                                on time                0  Pressure     93 ms     ------                                half-time                                Mean          3 mm Hg  ------                                gradient,                                D                                Peak          6 mm Hg  ------                                gradient,                                D                                Peak E/A    0.8        ------                                ratio                                Area (PHT) 2.37 cm^2   ------                                Area index  1.3 cm^2/m ------                                (PHT)           ^2                                Area       1.94 cm^2   ------                                (LVOT)                                continuity                                Area index 1.06  cm^2/m ------                                (LVOT           ^2                                cont)                                Annulus    41.2 cm     ------                                VTI                                Tricuspid valve                                Regurg      246 cm/s   ------                                peak vel                                Peak RV-RA   24 mm Hg  ------                                gradient,                                S                                Max regurg  246 cm/s    ------                                vel                                Systemic veins                                Estimated     5 mm Hg  ------                                CVP                                Right ventricle  Pressure,    29 mm Hg  <30                                S                                Sa vel,    10.8 cm/s   ------                                lat ann,                                tiss DP   ------------------------------------------------------------ Prepared and Electronically Authenticated by  Rennis Golden, [place] 2013-12-15T10:18:28.197  CARDIAC CATHETERIZATION REPORT    Procedures performed:   1. Left heart catheterization   2. Selective coronary angiography   3. Left ventriculography   Reason for procedure:   Severe aortic stenosis Coronary artery disease status post previous coronary bypass surgery  Procedure performed by: Thurmon Fair, MD, [facility]  Complications: none   Estimated blood loss: less than 5 mL   History:  The patient is a 79 year old woman who is now roughly 16 years status post multivessel coronary bypass surgery and presents with new onset dyspnea on exertion. She is found to have severe aortic stenosis by echocardiography.  Consent: The risks, benefits, and details of the procedure were explained to the patient. Risks including death, MI, stroke, bleeding, limb ischemia, renal failure and allergy were described and accepted by the patient. Informed written consent was obtained prior to proceeding.  Technique: The patient was brought to the cardiac catheterization laboratory in the fasting state. She was prepped and draped in the usual sterile fashion. Local anesthesia with 1% lidocaine was administered to the right groin area. Using the modified Seldinger technique a 5 French right common femoral artery sheath was introduced without difficulty. In a similar fashion a 7 [place] right common femoral  vein sheath was also introduced. A 7 [place] balloontipped Swan-Ganz catheter was advanced under fluoroscopic guidance to the pulmonary artery and used to record pressures in the wedge position, main pulmonary artery, right ventricle and right atrium, respectively as well as to obtain blood samples for oxygen saturation from the pulmonary artery and right atrium. A synchronous sample of blood for oxygen saturation was obtained from the right femoral sheath Under fluoroscopic guidance, using 5 [place] JL4 and JR selective cannulation of the left coronary artery, right coronary artery, saphenous vein graft bypass to the right coronary artery, saphenous vein graft bypass to the oblique marginal artery , left internal mammary artery bypass to the LAD artery were respectively performed. Several coronary angiograms in a variety of projections were recorded. The aortic valve was crossed with the JR catheter and a straight tipped wire and a hand injection of the left ventricle is performed. Left ventricular pressure and a pull back to the aorta were recorded. No immediate complications occurred. At the end of the procedure, all catheters were removed. After the procedure, hemostasis will be achieved with manual pressure.  Contrast used: 85 mL Omnipaque  Hemodynamic findings:  Aortic pressure 140/67 (mean 97) mm Hg Left ventricle 194/13 with end-diastolic pressure of 20 mm Hg Pulmonary artery wedge position A wave 18, V wave 19, mean  15 mm Hg Pulmonary artery 41/17 (mean 27) mm Hg Right ventricle 39/6 with an end-diastolic pressure of 11 mm Hg Right atrium A-wave 13 V-wave 13 mean 11 mm Hg  Cardiac output is 4.42 L per minute (cardiac index 2.44 L per minute per meter sq) by the Fick equation. Cardiac output is 5.36 L per minute (cardiac index 2.96 L per minute per meter sq) by thermodilution  Aortic saturation 90%, PA saturation 62%.  By the Gorlin equation , the calculated aortic valve area 0.86 cm by the  Fick equation, 0.71 cm square using thermodilution. Index for body surface area distress lates into 8 aortic valve area of 0.39-0.47 cm square per meter square body surface area. The peak to peak transvalvular gradient is 51 mm Hg, the mean transvalvular gradient is 35 mm Hg but these values are probably underestimated due to the pullback technique.  The pulmonary vascular resistance is 179 DSC (324 DSCI).  Angiographic Findings:   1. The left main coronary artery is heavily calcified and moderately diffusely diseased and bifurcates in the usual fashion into the left anterior descending artery and left circumflex coronary artery.   2. The left anterior descending artery is totally occluded following a very small first septal artery. There is evidence of extensive luminal irregularities and heavy calcification. The distal LAD artery fills via a left internal mammary artery bypass to provide both antegrade and retrograde flow only one major diagonal artery is seen filling in a retrograde fashion from the proximal LAD. Although the LAD artery is diffusely diseased, no discrete hemodynamically significant stenoses are seen. 3. The left circumflex coronary artery is a large-size vessel non- dominant vessel that generates three major oblique marginal arteries. There is evidence of extensive luminal irregularities and heavy calcification. A lengthy 70% proximal stenosis is seen. The first oblique marginal arteries by far the largest and receives most of its flow via a saphenous vein graft bypass. There is a 50% stenosis in the first oblique marginal artery but there is excellent retrograde flow into the remainder of the left circumflex system during injection of the vein graft. There is an approximately 50% stenosis in the AV groove vessel between the first and second oblique marginal arteries 4. The right coronary artery is a large-size dominant vessel that generates that is severely diffusely diseased and is  totally occluded in its midportion. There is evidence of extensive luminal irregularities and heavy calcification. The posterior descending artery and posterolateral ventricular artery branches of the distal RCA filled via the saphenous vein graft bypass. Although they exhibit diffuse atherosclerosis that widely patent and of excellent flow. There is also retrograde flow to the mid distal native right coronary artery which fills a large right ventricular marginal branch   5. the saphenous vein graft bypass to the right coronary artery shows excellent flow. It has a proximal 70% irregular stenosis but the remainder of the graft shows only mild luminal irregularities. 6. the saphenous vein graft bypass to the oblique marginal artery is widely patent shows excellent flow and it has mild to moderate luminal irregularities but no significant stenoses. 7. the left internal mammary artery bypass to the LAD artery is a excellent healthy-appearing conduit with brisk flow. It is relatively tortuous and approaches the left border of the sternum in its top third but does not appear to cross behind the sternum. 8. The left ventricle is normal in size. The left ventricle systolic function is normal with an estimated ejection fraction of 65-70 %. Regional  wall motion abnormalities are not seen. No left ventricular thrombus is seen. There is no mitral insufficiency seen during the hand injection left ventriculogram. The ascending aorta appears normal. There is severe aortic valve stenosis by pullback. The aortic and left ventricular waveform showed a delayed peak     IMPRESSIONS:   Severe aortic stenosis. Severe coronary disease of the native coronary arteries with total occlusion of the LAD and right coronary arteries and high-grade stenosis of the proximal left circumflex coronary artery. Severe stenosis of the proximal saphenous vein graft to the right coronary artery but no significant stenoses in the other 2 bypass  grafts. Mild pulmonary arterial hypertension.   RECOMMENDATION:   Mrs. Becky Mcmahon has symptomatic severe aortic stenosis with preserved left extrasystolic function and should undergo replacement of her aortic valve with a biological prosthesis.   She seems to be an appropriate candidate for either surgical aortic valve replacement or a transaortic valvular replacement.   The latter approach is appealing in view of her age and the fact that it would provide optimal preservation of her bypasses. The stenosis in the proximal saphenous vein graft to right coronary artery could then be treated with percutaneous revascularization and stenting.   On the other hand her functional status is good and the LIMA bypass seems to be relatively far from the midsternal line so that she may also be considered for surgical treatment . We'll refer Ms. Ring to the valve clinic to discuss these options with Dr. Calton Dach and Dr. Tressie Stalker.         Pulmonary Function Tests  Baseline                                                                      Post-bronchodilator  FVC                 2.24 L  (89% predicted)          FVC                 2.26 L  (90% predicted) FEV1               1.57 L  (84% predicted)          FEV1               1.64 L  (88% predicted) FEF25-75        0.92 L  (67% predicted)          FEF25-75        1.13 L  (82% predicted)  RV                   2.01 L  (86% predicted)  6 Minute Walk Test Results  Patient:            ANNAROSE OUELLET Date:               05/27/2012   Supplemental O2 during test?            NO  Baseline                                 End  Time                            1330                                        1336 Heartrate                     72                                            100 Dyspnea                      MILD                                        MODERATE Fatigue                        MILD                                         MODERATE O2 sat                         99%                                         98% Blood pressure           113/72                                     122/64   Patient ambulated at a SLOW pace for a total distance of 400 feet with 1 stop.  Ambulation was limited primarily due to shortness of breath and decreased saturations.  Overall the test was tolerated fairly.   STS Risk Calculator  Procedure                                         AVREPL+REDOCABG  Risk of Mortality                                8.348% Morbidity or Mortality                       31.983% Prolonged LOS  18.559% Short LOS                                           13.441% Permanent Stroke                            3.382% Prolonged Vent Support                      28.925% DSW Infection                                     0.782% Renal Failure                                       8.834% Reoperation                                        10.534%   Impression:  The patient has severe symptomatic aortic stenosis with underlying history of coronary artery disease, status post coronary artery bypass grafting in 1997. All previous bypass grafts are patent and functional with only moderate stenosis in the proximal segment of one vein graft which does not seem to be symptomatic at this time. Left ventricular function remains fairly well preserved. The patient does suffer from arthritis but overall remains fairly active physically with her primary limitation at this time remaining exertional shortness of breath. Risks associated with conventional aortic valve replacement with or without redo coronary artery bypass grafting would be somewhat elevated because of the patient's advanced age and mild frailty. I agree that transcatheter aortic valve replacement might be an reasonable alternative to conventional surgery under the circumstances.   Plan:  The  patient was counseled at length regarding treatment alternatives for management of severe symptomatic aortic stenosis. The high-risk nature of surgical intervention has been discussed in detail. Long-term prognosis with medical therapy was discussed. Alternative approaches such as conventional aortic valve replacement, transcatheter aortic valve replacement, and palliative medical therapy were compared and contrasted at length. This discussion was placed in the context of the patient's own specific clinical presentation and past medical history.  The patient would need cardiac gated CT angiogram of the heart as well as CT angiogram of the abdomen and pelvis to further assess the feasibility of transcatheter aortic valve replacement and examine access options. The patient and her family want to discuss matters further before proceeding any further.  We will plan to see her back in the multidisciplinary valve clinic in 2 weeks if desired.     Salvatore Decent. Cornelius Moras, MD 05/27/2012 12:45 PM

## 2012-05-27 NOTE — Progress Notes (Deleted)
Pulmonary Function Tests  Baseline      Post-bronchodilator  FVC  2.24 L  (89% predicted) FVC  2.26 L  (90% predicted) FEV1  1.57 L  (84% predicted) FEV1  1.64 L  (88% predicted) FEF25-75 0.92 L  (67% predicted) FEF25-75 1.13 L  (82% predicted)  RV  2.01 L  (86% predicted)  6 Minute Walk Test Results  Patient: Becky Mcmahon Date:  05/27/2012   Supplemental O2 during test? NO      Baseline   End  Time   1330    1336 Heartrate  72    100 Dyspnea  MILD    MODERATE Fatigue  MILD    MODERATE O2 sat   99%    98% Blood pressure 113/72    122/64   Patient ambulated at a SLOW pace for a total distance of 400 feet with 1 stop.  Ambulation was limited primarily due to shortness of breath and decreased saturations.  Overall the test was tolerated fairly.  STS Risk Calculator  Procedure    AVREPL+REDOCABG  Risk of Mortality   8.348% Morbidity or Mortality  31.983% Prolonged LOS   18.559% Short LOS    13.441% Permanent Stroke   3.382% Prolonged Vent Support  28.925% DSW Infection    0.782% Renal Failure    8.834% Reoperation    10.534%    Review of Systems:   General:  NO loss of appetite, YES decreased energy, NO weight gain, NO weight loss, NO fever  Cardiac:  NO chest pain with exertion, NO chest pain at rest, YES SOB with  exertion, NO resting SOB, NO PND, NO orthopnea, NO palpitations, NO arrhythmia, NO atrial fibrillation, YES LE edema, NO dizzy spells, NO syncope  Respiratory:  YES shortness of breath, NO home oxygen, NO productive cough, NO dry cough, NO bronchitis, NO wheezing, NO hemoptysis, NO asthma, NO pain with inspiration or cough, NO sleep apnea, NO CPAP at night  GI:   NO difficulty swallowing, YES reflux, YES frequent heartburn, YES hiatal hernia, NO abdominal pain, NO constipation, NO diarrhea, NO hematochezia, NO hematemesis, NO melena  GU:   NO dysuria,  NO frequency, NO urinary tract infection, YES hematuria, NO enlarged prostate, NO kidney  stones, NO kidney disease  Vascular:  NO pain suggestive of claudication, NO pain in feet, YES leg cramps, YES varicose veins, NO DVT, NO non-healing foot ulcer  Neuro:   NO stroke, NO TIA's, NO seizures, NO headaches, NOtemporary blindness one eye,  NO slurred speech, NO peripheral neuropathy, NO chronic pain, NO instability of gait, NO memory/cognitive dysfunction  Musculoskeletal: YES arthritis, NO joint swelling, YES myalgias, YES difficulty walking   Skin:   YES rash, YES itching, NO skin infections, NO pressure sores or ulcerations  Psych:   YES anxiety, NO depression, NO nervousness, NO unusual recent stress  Eyes:   NO blurry vision, NO floaters, NO recent vision changes, NO wears glasses or contacts  ENT:   YES hearing loss, NO loose or painful teeth, YES dentures, last saw dentist 2013  Hematologic:  YES easy bruising, NO abnormal bleeding, NOclotting disorder, NO frequent epistaxis  Endocrine:  YES diabetes, does check CBG's at home YES

## 2012-06-25 ENCOUNTER — Other Ambulatory Visit: Payer: Self-pay

## 2012-08-08 DIAGNOSIS — K219 Gastro-esophageal reflux disease without esophagitis: Secondary | ICD-10-CM | POA: Insufficient documentation

## 2012-08-08 DIAGNOSIS — C50919 Malignant neoplasm of unspecified site of unspecified female breast: Secondary | ICD-10-CM | POA: Insufficient documentation

## 2012-08-08 DIAGNOSIS — E039 Hypothyroidism, unspecified: Secondary | ICD-10-CM | POA: Insufficient documentation

## 2012-08-08 DIAGNOSIS — M810 Age-related osteoporosis without current pathological fracture: Secondary | ICD-10-CM | POA: Insufficient documentation

## 2012-10-13 ENCOUNTER — Ambulatory Visit (INDEPENDENT_AMBULATORY_CARE_PROVIDER_SITE_OTHER): Payer: Medicare Other | Admitting: Cardiovascular Disease

## 2012-10-13 ENCOUNTER — Encounter: Payer: Self-pay | Admitting: Cardiovascular Disease

## 2012-10-13 VITALS — BP 140/70 | HR 57 | Resp 16 | Ht 66.0 in | Wt 169.1 lb

## 2012-10-13 DIAGNOSIS — I359 Nonrheumatic aortic valve disorder, unspecified: Secondary | ICD-10-CM

## 2012-10-13 DIAGNOSIS — I10 Essential (primary) hypertension: Secondary | ICD-10-CM

## 2012-10-13 DIAGNOSIS — Z952 Presence of prosthetic heart valve: Secondary | ICD-10-CM

## 2012-10-13 DIAGNOSIS — Z954 Presence of other heart-valve replacement: Secondary | ICD-10-CM

## 2012-10-13 DIAGNOSIS — I2510 Atherosclerotic heart disease of native coronary artery without angina pectoris: Secondary | ICD-10-CM

## 2012-10-13 DIAGNOSIS — I82409 Acute embolism and thrombosis of unspecified deep veins of unspecified lower extremity: Secondary | ICD-10-CM

## 2012-10-13 DIAGNOSIS — Z951 Presence of aortocoronary bypass graft: Secondary | ICD-10-CM

## 2012-10-13 MED ORDER — METOPROLOL TARTRATE 25 MG PO TABS: 12.5000 mg | ORAL_TABLET | Freq: Two times a day (BID) | ORAL | Status: DC

## 2012-10-13 NOTE — Assessment & Plan Note (Addendum)
Recurrent, 1st episode after CABG 1997, 2nd episode after hip surgery 2001, placed on life-long coumadin ever since. Her pubic INR today. Followed in our Coumadin clinic.

## 2012-10-13 NOTE — Assessment & Plan Note (Addendum)
She has had remarkable symptomatic improvement following implantation of the prostatic valve. There have been no complications. Suspect that her symptoms are related to beta blocker side effect rather than any type of valve malfunction.

## 2012-10-13 NOTE — Assessment & Plan Note (Addendum)
S/P CABG: LIMA to LAD, sequential SVG to RI and OM, sequential SVG to RCA and PDA, open vein harvest from both thighs and legs - Dr Donata Clay 1997 Cardiac catheterization generates 2014 : Severe coronary disease of the native coronary arteries with total occlusion of the LAD and right coronary arteries and high-grade stenosis of the proximal left circumflex coronary artery.  Severe stenosis of the proximal saphenous vein graft to the right coronary artery but no significant stenoses in the other 2 bypass grafts.

## 2012-10-13 NOTE — Assessment & Plan Note (Addendum)
Although beta blocker therapy would be desirable secondary to her multiple cardiac problems she appears to be poorly tolerant of this. I've asked her to decrease the metoprolol tartrate to 12.5 mg twice daily. If she is still symptomatic we might consider switching to metoprolol succinate 12.5 mg once daily, but she may not be able to tolerate beta blockers at all.

## 2012-10-13 NOTE — Progress Notes (Signed)
Patient ID: Becky Mcmahon, female   DOB: 08-Apr-1931, 79 y.o.   MRN: [id number]      Reason for office visit Followup for aortic stenosis status post TAVR  Becky Mcmahon is an 79 year old woman returning in followup roughly 2 months status post transarterial aortic valve replacement performed at [facility] in April 8. She has a rhythm back to [facility] for one-month followup on May 7 and has had an echocardiogram performed which shows excellent valve function . "(Aortic: TRIVIAL AR OTHER PROSTHETIC AoV 1.7 m/s peak vel 12 mmHg peak grad 6 mmHg mean grad 1.7 cm2 by DOPPLER, Resting LVOT Vel: 1.0 m/s, Dimensionless Index: 0.59)" by echo.  She felt immediately better after the procedure and had an uncomplicated course. The very next day she was walking around and had much more energy and less dizziness and shortness of breath.  However she was started on beta blocker therapy and has now developed recurrent episodes of extreme fatigue unusual sensation in her head and dizziness. She has checked her blood pressure during one of these events and her home monitor shows a blood pressure in the 105/50 range and a heart rate in the 50s. She has not had full-blown syncope and denies any focal neurological deficits. She has not had any bleeding problems. She is now taking warfarin again and her INR today was 2.5. She is also on clopidogrel.  Allergies  Allergen Reactions  . Contrast Media  (Iodinated Diagnostic Agents) Itching  . Other     Band Aids  . Latex Rash    Current Outpatient Prescriptions  Medication Sig Dispense Refill  . alendronate (FOSAMAX) 70 MG tablet Take 70 mg by mouth every 7 (seven) days. On Mondays; Take with a full glass of water on an empty stomach.      . Ascorbic Acid (VITAMIN C) 1000 MG tablet Take 1,000 mg by mouth daily.      Marland Kitchen aspirin EC 81 MG tablet Take 81 mg by mouth daily.      Marland Kitchen atorvastatin (LIPITOR) 20 MG tablet Take 20 mg by mouth at  bedtime.      . Calcium Carbonate-Vitamin D (CALCIUM 600 + D PO) Take 1 tablet by mouth daily.      . cetirizine (ZYRTEC) 10 MG tablet Take 10 mg by mouth daily.      . Cholecalciferol (VITAMIN D3) 2000 UNITS TABS Take 1 tablet by mouth daily.      Marland Kitchen enoxaparin (LOVENOX) 80 MG/0.8ML injection       . esomeprazole (NEXIUM) 40 MG capsule Take 40 mg by mouth daily before breakfast.      . levothyroxine (SYNTHROID, LEVOTHROID) 50 MCG tablet Take 50 mcg by mouth daily.      Marland Kitchen lisinopril (PRINIVIL,ZESTRIL) 10 MG tablet Take 10 mg by mouth daily.      Marland Kitchen LORazepam (ATIVAN) 0.5 MG tablet Take 0.25-0.5 mg by mouth 2 (two) times daily. For anxiety      . metoprolol tartrate (LOPRESSOR) 25 MG tablet Take 0.5 tablets (12.5 mg total) by mouth 2 (two) times daily.      . Multiple Vitamin (MULTIVITAMIN WITH MINERALS) TABS Take 1 tablet by mouth daily.      Marland Kitchen warfarin (COUMADIN) 5 MG tablet Take 5 mg by mouth daily. Or as directed      . clopidogrel (PLAVIX) 75 MG tablet       . metroNIDAZOLE (METROCREAM) 0.75 % cream Apply 1 application topically daily.      Marland Kitchen  Nitrofurantoin Monohyd Macro (MACROBID PO) Take by mouth 2 (two) times daily.       No current facility-administered medications for this visit.    Past Medical History  Diagnosis Date  . Severe aortic stenosis     2D ECHO, 04/22/2012 - EF 60-65%, aortic valve-heavily calcified with restricted leaflet motion, left atrium severly dilated  . CAD (coronary artery disease)   . Hyperlipidemia   . Osteoporosis   . Vitamin D deficiency   . Hypothyroidism   . GERD (gastroesophageal reflux disease)   . Hypertension   . Anxiety   . Pancreatitis   . Ovarian cyst   . Breast cancer     Rt mastectomy  . Guaiac positive stools   . S/P CABG x 5 10/25/1995    LIMA to LAD, sequential SVG to RI and OM, sequential SVG to RCA and PDA, open vein harvest from both thighs and legs - Dr Donata Clay  . Type II diabetes mellitus     Diet controlled  . Arthritis   .  Hypercholesteremia   . DVT (deep venous thrombosis)     Recurrent, 1st episode after CABG 1997, 2nd episode after hip surgery 2001, placed on life-long coumadin ever since; LEXISCAN, 03/13/2008 - normal, no ECG changes, EKG negative for ischemia  . Swelling of right lower extremity     LEA VENOUS DUPLEX SCAN, 08/18/2011 - no evidence of acute thrombus or thrombophlebitis  . Cerebral atherosclerosis     CAROTID DOPPLER, 06/18/2009 - RIGHT/LEFT BULB AND PROXIMAL ICAs-0-49% diameter reduction    Past Surgical History  Procedure Laterality Date  . Coronary artery bypass graft  10/25/1995    x5 , Dr Donata Clay  . Right mastectomy  1979  . Laparotomy    . Hip arthroplasty      right  . Cardiolite myocardial perfusion study  06/13/2004    Normal static and dynamic myocardial perfusion images with EF of 73%    Family History  Problem Relation Age of Onset  . Pancreatitis Mother   . Heart disease Father     family HX  . Heart disease Sister   . Heart disease Brother   . Heart disease Brother 65  . Heart disease Sister 60    History   Social History  . Marital Status: Married    Spouse Name: N/A    Number of Children: N/A  . Years of Education: N/A   Occupational History  . retired grade school teacher [employer]   Social History Main Topics  . Smoking status: Former Smoker -- 0.50 packs/day for 19 years    Quit date: 05/24/1969  . Smokeless tobacco: Never Used  . Alcohol Use: No  . Drug Use: Not on file  . Sexually Active: Not on file   Other Topics Concern  . Not on file   Social History Narrative   Lives with husband and 1 daughter, 2nd daughter lives in [place]   Remains physically active and functionally independent   Active in church    Review of systems: The patient specifically denies any chest pain at rest or with exertion, dyspnea at rest or with exertion, orthopnea, paroxysmal nocturnal dyspnea, syncope, palpitations, focal neurological deficits,  intermittent claudication, lower extremity edema, unexplained weight gain, cough, hemoptysis or wheezing.  The patient also denies abdominal pain, nausea, vomiting, dysphagia, diarrhea, constipation, polyuria, polydipsia, dysuria, hematuria, frequency, urgency, abnormal bleeding or bruising, fever, chills, unexpected weight changes, mood swings, change in skin or hair  texture, change in voice quality, auditory or visual problems, allergic reactions or rashes, new musculoskeletal complaints other than usual "aches and pains".   PHYSICAL EXAM BP 140/70  Pulse 57  Resp 16  Ht 5\' 6"  (1.676 m)  Wt 169 lb 1.6 oz (76.703 kg)  BMI 27.31 kg/m2  General: Alert, oriented x3, no distress Head: no evidence of trauma, PERRL, EOMI, no exophtalmos or lid lag, no myxedema, no xanthelasma; normal ears, nose and oropharynx Neck: normal jugular venous pulsations and no hepatojugular reflux; brisk carotid pulses without delay and soft carotid bruits left more than right Chest: clear to auscultation, no signs of consolidation by percussion or palpation, normal fremitus, symmetrical and full respiratory excursions; no healed sternotomy scar Cardiovascular: normal position and quality of the apical impulse, regular rhythm, normal first and second heart sounds, unusual 1-2/6 "fluttery" aortic ejection murmur that is early peaking, no diastolic murmurs, rubs or gallops Abdomen: no tenderness or distention, no masses by palpation, no abnormal pulsatility or arterial bruits, normal bowel sounds, no hepatosplenomegaly Extremities: no clubbing, cyanosis or edema; 2+ radial, ulnar and brachial pulses bilaterally; 2+ right femoral, posterior tibial and dorsalis pedis pulses; 2+ left femoral, posterior tibial and dorsalis pedis pulses; no subclavian or femoral bruits; -appearing femoral access site Neurological: grossly nonfocal   EKG: Sinus bradycardia,   Tall R waves in leads V1 and V2 suggests old posterior infarction;   T-wave flattening in leads V3 V4 aVF and III  ASSESSMENT AND PLAN Aortic valve disorders She has had remarkable symptomatic improvement following implantation of the prostatic valve. There have been no complications. Suspect that her symptoms are related to beta blocker side effect rather than any type of valve malfunction.   CAD (coronary artery disease) S/P CABG: LIMA to LAD, sequential SVG to RI and OM, sequential SVG to RCA and PDA, open vein harvest from both thighs and legs - Dr Donata Clay 1997 Cardiac catheterization generates 2014 : Severe coronary disease of the native coronary arteries with total occlusion of the LAD and right coronary arteries and high-grade stenosis of the proximal left circumflex coronary artery.  Severe stenosis of the proximal saphenous vein graft to the right coronary artery but no significant stenoses in the other 2 bypass grafts.      DVT (deep venous thrombosis) Recurrent, 1st episode after CABG 1997, 2nd episode after hip surgery 2001, placed on life-long coumadin ever since.   Hypertension Although beta blocker therapy would be desirable secondary to her multiple cardiac problems she appears to be poorly tolerant of this. I've asked her to decrease the metoprolol tartrate to 12.5 mg twice daily. If she is still symptomatic we might consider switching to metoprolol succinate 12.5 mg once daily, but she may not be able to tolerate beta blockers at all.   Orders Placed This Encounter  Procedures  . EKG 12-Lead   Meds ordered this encounter  Medications  . DISCONTD: metoprolol tartrate (LOPRESSOR) 25 MG tablet    Sig: Take 25 mg by mouth 2 (two) times daily.  . metoprolol tartrate (LOPRESSOR) 25 MG tablet    Sig: Take 0.5 tablets (12.5 mg total) by mouth 2 (two) times daily.  Junious Silk, MD, [facility] [phone] office [phone] pager      [facility]  [place], [place] [id number] CARDIAC DIAGNOSTIC UNIT Date: 08/19/2012 10:30:00 Adult Female Age: 60 ECHO-DOPPLER REPORT Inpatient 3300 ---------------------------------------------------- MD1: Romeo Apple,  Nona Dell STUDY: Chest Wall TAPE: 0000:00:0:00:00 BP: 111/58 ECHO: Yes DOPPLER: Yes FILE: [id number]: HR: 75 COLOR: Yes CONTRAST: No MACHINE: [id number] #[id number] Height: 64 in RV BIOPSY: No 3D: No SOUND QLTY: Moderate Weight: 173 lbs MEDIUM: None BSA: 1.88, BMI: 29.70 ------------------------------------------------------------------------------ HISTORY: Special protocol and AVR REASON: Assess LV function Assess Aortic Regurgitation INDICATION: V70.7 - Examination of participant in clinical trial.  ECHOCARDIOGRAPHIC MEASUREMENTS ----------------------------------------------- 2D DIMENSIONS AORTA Values Normal Range MAIN PA Values Normal Range Annulus: nm* cm [2.1 - 2.5] PA Main: nm* cm [1.5 - 2.1] Aorta Sin: nm* cm [2.7 - 3.3] RIGHT VENTRICLE ST Junction: nm* cm [2.3 - 2.9] RV Base: nm* cm [<= 4.2] Asc.Aorta: nm* cm [2.3 - 3.1] RV Mid: nm* cm [<= 3.5] LEFT VENTRICLE RV Length: nm* cm [<= 8.6] LVIDd: 3.8 cm [3.9 - 5.3] INFERIOR VENA CAVA LVIDs: 2.7 cm Max.IVC: nm* cm [<= 2.5] FS: 29% [> 25%] Min.IVC: nm* cm [<= 1.7] SWT: 1.3 cm [0.6 - 1.0] __________________ PWT: 1.2 cm [0.6 - 1.0] nm* - not measured LEFT ATRIUM LA Diam: 2.6 cm [2.7 - 3.8] LA Area: 21 cm2 [< 20] LA Volume: nm* mL [22 - 52]  ECHOCARDIOGRAPHIC DESCRIPTIONS ----------------------------------------------- AORTIC ROOT Size: Normal Dissection: INDETERM FOR DISSECTION  AORTIC VALVE Leaflets: OTHER PROSTHETIC Morphology: Normal Mobility: Fully Mobile AOV Note: CORE VALVE  LEFT VENTRICLE Anterior: Normal Size: SMALL Lateral: Normal Contraction: Normal Septal: Normal Closest EF: >55% (Estimated) Apical: Normal LV masses: No Masses Inferior: Normal LVH: MILD LVH CONCENTRIC Posterior: Normal Dias.FxClass: RELAXATION  ABNORMALITY (GRADE 1) CORRESPONDS TO E/A REVERSAL  MITRAL VALVE Leaflets: ABNORMAL Mobility: Fully mobile Morphology: ANNULAR CALC  LEFT ATRIUM Size: MILDLY ENLARGED LA masses: No masses Normal IAS  MAIN PA Size: Normal  PULMONIC VALVE Morphology: Normal Mobility: Fully Mobile  RIGHT VENTRICLE Size: Normal Free wall: Normal Contraction: Normal RV masses: No Masses TAPSE: 1.3 cm, Normal Range [>= 1.6 cm] RV Note: RV SYSTOLIC ANNULAR VELOCITY = 10cm/sec  TRICUSPID VALVE Leaflets: Normal Mobility: Fully mobile Morphology: Normal  RIGHT ATRIUM Size: Normal RA Other: None RA masses: No masses  PERICARDIUM Fluid: No effusion  INFERIOR VENACAVA Size: Normal Normal respiratory collapse  DOPPLER ECHO and OTHER SPECIAL PROCEDURES ------------------------------------ Aortic: TRIVIAL AR OTHER PROSTHETIC AoV 1.7 m/s peak vel 12 mmHg peak grad 6 mmHg mean grad 1.7 cm2 by DOPPLER Resting LVOT Vel: 1.0 m/s Dimensionless Index: 0.59  Mitral: TRIVIAL MR No MS MV Inflow E Vel.= nm* cm/s MV Annulus E'Vel.= nm* cm/s E/E'Ratio= nm*  Tricuspid: TRIVIAL TR No TS 1.9 m/s peak TR vel 18 mmHg peak RV pressure  Pulmonary: TRIVIAL PR No PS  Other:  INTERPRETATION --------------------------------------------------------------- NORMAL LEFT VENTRICULAR SYSTOLIC FUNCTION WITH MILD LVH NORMAL RIGHT VENTRICULAR SYSTOLIC FUNCTION VALVULAR REGURGITATION: TRIVIAL AR, TRIVIAL MR, TRIVIAL PR, TRIVIAL TR Compared with prior Echo study on 08/17/2012: NO CHANGES   (Report version 3.0) Interpreted and Electronically signed Perform. by: Serita Grammes, RCS by: Pandora Leiter, M Resp.Person: Serita Grammes, RCS On: 08/19/2012 15:03:03

## 2012-10-13 NOTE — Patient Instructions (Signed)
Your physician has recommended you make the following change in your medication: reduce metoprolol to 12.5 mg twice a day (1/2 tablet twice a day). Your physician recommends that you schedule a follow-up appointment in: 2 months

## 2012-10-18 ENCOUNTER — Telehealth: Payer: Self-pay | Admitting: Cardiovascular Disease

## 2012-10-18 NOTE — Telephone Encounter (Signed)
Returned call.  Pt stated she is on lopressor and saw Dr. Salena Saner last week.  Stated he reduced it to a 1/2 pill in AM and 1/2 pill in PM b/c she was having dizziness.  Stated she has been doing that and she still has it, but not as bad.  Stated she thinks he says he would reduce it more.  Pt stated she called to tell him it's better, but she is still having those spells.  Pt advised to continue current dose and informed Dr. Salena Saner will be notified for further instructions.  Pt verbalized understanding and agreed w/ plan.

## 2012-10-18 NOTE — Telephone Encounter (Signed)
Pt states that she had had heart surgery and was placed on Lopressor.  She always has a lot of side effects from this medication.   She saw Dr. Salena Saner last week and he cut her dosage in half and told her to call this week with an update.  Patient states that she is still having side effects.

## 2012-10-19 MED ORDER — METOPROLOL SUCCINATE ER 25 MG PO TB24: 12.5000 mg | ORAL_TABLET | Freq: Every day | ORAL | Status: DC

## 2012-10-19 NOTE — Telephone Encounter (Signed)
Returned call and informed pt per instructions by MD/PA.  Also informed Rx already sent to pharmacy.  Pt verbalized understanding and agreed w/ plan.  Pt repeated instructions.

## 2012-10-19 NOTE — Telephone Encounter (Signed)
Stop metoprolol tartrate. Start metoprolol succinate 12.5 mg daily at bedtime. We will try this for a month. If she can't tolerate it, will stop after a month

## 2012-11-07 ENCOUNTER — Telehealth: Payer: Self-pay | Admitting: Cardiovascular Disease

## 2012-11-07 NOTE — Telephone Encounter (Signed)
Returned call.  Pt stated she has been taking the metoprolol succ 1/2 tab daily for 20 days and is still having some dizziness.  Pt stated it just doesn't agree with her. Pt informed per last telephone note that Dr. Royann Shivers said he would stop metoprolol after a month if she still couldn't tolerate it.  Pt stated she read that you can't stop it abruptly and she didn't want to.  Pt informed Dr. Royann Shivers is out of the office x 2 weeks and another provider will be notified to review and advise.  Pt verbalized understanding and agreed w/ plan.  Message forwarded to Dr. Rennis Golden to review in Dr. Erin Hearing absence.

## 2012-11-07 NOTE — Telephone Encounter (Signed)
Please call-her Metoprolol is not agreeing with her! Makes her dizzy!

## 2012-11-09 NOTE — Telephone Encounter (Signed)
Ok to stop metoprolol if it bothers her. It is a low dose and withdrawal is unlikely.  Dr. Rennis Golden

## 2012-11-09 NOTE — Telephone Encounter (Signed)
Returned call and informed pt per instructions by MD/PA.  Pt verbalized understanding and agreed w/ plan.  

## 2012-12-14 ENCOUNTER — Other Ambulatory Visit: Payer: Self-pay

## 2012-12-15 ENCOUNTER — Encounter (HOSPITAL_COMMUNITY)
Admission: RE | Admit: 2012-12-15 | Discharge: 2012-12-15 | Disposition: A | Discharge status: Home / Self Care | Payer: Medicare Other | Source: Ambulatory Visit | Attending: Cardiovascular Disease | Admitting: Cardiovascular Disease

## 2012-12-15 DIAGNOSIS — Z5189 Encounter for other specified aftercare: Secondary | ICD-10-CM | POA: Insufficient documentation

## 2012-12-15 DIAGNOSIS — I25810 Atherosclerosis of coronary artery bypass graft(s) without angina pectoris: Secondary | ICD-10-CM | POA: Insufficient documentation

## 2012-12-15 DIAGNOSIS — I2510 Atherosclerotic heart disease of native coronary artery without angina pectoris: Secondary | ICD-10-CM | POA: Insufficient documentation

## 2012-12-15 DIAGNOSIS — I059 Rheumatic mitral valve disease, unspecified: Secondary | ICD-10-CM | POA: Insufficient documentation

## 2012-12-15 DIAGNOSIS — I2789 Other specified pulmonary heart diseases: Secondary | ICD-10-CM | POA: Insufficient documentation

## 2012-12-15 DIAGNOSIS — I079 Rheumatic tricuspid valve disease, unspecified: Secondary | ICD-10-CM | POA: Insufficient documentation

## 2012-12-15 DIAGNOSIS — I517 Cardiomegaly: Secondary | ICD-10-CM | POA: Insufficient documentation

## 2012-12-15 NOTE — Progress Notes (Signed)
Cardiac Rehab Medication Review by a Pharmacist  Does the patient  feel that his/her medications are working for him/her?  yes  Has the patient been experiencing any side effects to the medications prescribed?  yes  Does the patient measure his/her own blood pressure or blood glucose at home?  yes   Does the patient have any problems obtaining medications due to transportation or finances?   no  Understanding of regimen: fair Understanding of indications: good Potential of compliance: good    Pharmacist comments: Becky Mcmahon is a pleasant 79 yo female in for cardiac rehab today.  About a month ago, she discontinued her metoprolol (consulting with her cardiologist) due to feelings of gettting faint and blurred vision. She has an appointment with that office next week.  She asked why she was on it, and I explained how it worked.  She currently takes warfarin at 5mg  daily and is going in for an INR check later this week.   Shelba Flake Achilles Dunk, PharmD Clinical Pharmacist - Resident Pager: [phone] Pharmacy: [phone] 12/15/2012 8:29 AM

## 2012-12-19 ENCOUNTER — Encounter (HOSPITAL_COMMUNITY)
Admission: RE | Admit: 2012-12-19 | Discharge: 2012-12-19 | Disposition: A | Discharge status: Home / Self Care | Payer: Medicare Other | Source: Ambulatory Visit | Attending: Cardiovascular Disease | Admitting: Cardiovascular Disease

## 2012-12-19 LAB — GLUCOSE, CAPILLARY: Glucose-Capillary: 78 mg/dL (ref 70–99)

## 2012-12-19 NOTE — Progress Notes (Addendum)
Pt started cardiac rehab today.  Pt tolerated light exercise without difficulty. Telemetry rhythm Sinus. Vital signs stable. Will continue to monitor the patient throughout  the program. Becky Mcmahon is diabetic but does not take any medication for diabetes at this time.  Post exercise CBG 78. Pre exercise CBG 122.  Becky Mcmahon said she ate breakfast at 0730.  Patient given lemonade and graham crackers.

## 2012-12-21 ENCOUNTER — Encounter: Payer: Self-pay | Admitting: Cardiovascular Disease

## 2012-12-21 ENCOUNTER — Encounter (HOSPITAL_COMMUNITY): Payer: Medicare Other

## 2012-12-21 ENCOUNTER — Ambulatory Visit (INDEPENDENT_AMBULATORY_CARE_PROVIDER_SITE_OTHER): Payer: Medicare Other | Admitting: Cardiovascular Disease

## 2012-12-21 VITALS — BP 148/62 | HR 80 | Ht 64.0 in | Wt 168.8 lb

## 2012-12-21 DIAGNOSIS — I2510 Atherosclerotic heart disease of native coronary artery without angina pectoris: Secondary | ICD-10-CM

## 2012-12-21 DIAGNOSIS — I359 Nonrheumatic aortic valve disorder, unspecified: Secondary | ICD-10-CM

## 2012-12-21 DIAGNOSIS — E7800 Pure hypercholesterolemia, unspecified: Secondary | ICD-10-CM

## 2012-12-21 DIAGNOSIS — I10 Essential (primary) hypertension: Secondary | ICD-10-CM

## 2012-12-21 DIAGNOSIS — I82409 Acute embolism and thrombosis of unspecified deep veins of unspecified lower extremity: Secondary | ICD-10-CM

## 2012-12-21 NOTE — Patient Instructions (Addendum)
Your physician recommends that you schedule a follow-up appointment in: 6 MONTHS 

## 2012-12-23 ENCOUNTER — Encounter (HOSPITAL_COMMUNITY)
Admission: RE | Admit: 2012-12-23 | Discharge: 2012-12-23 | Disposition: A | Discharge status: Home / Self Care | Payer: Medicare Other | Source: Ambulatory Visit | Attending: Cardiovascular Disease | Admitting: Cardiovascular Disease

## 2012-12-23 LAB — GLUCOSE, CAPILLARY: Glucose-Capillary: 171 mg/dL — ABNORMAL HIGH (ref 70–99)

## 2012-12-26 ENCOUNTER — Encounter (HOSPITAL_COMMUNITY): Payer: Medicare Other

## 2012-12-28 ENCOUNTER — Encounter (HOSPITAL_COMMUNITY): Payer: Medicare Other

## 2012-12-30 ENCOUNTER — Encounter (HOSPITAL_COMMUNITY)
Admission: RE | Admit: 2012-12-30 | Discharge: 2012-12-30 | Disposition: A | Discharge status: Home / Self Care | Payer: Medicare Other | Source: Ambulatory Visit | Attending: Cardiovascular Disease | Admitting: Cardiovascular Disease

## 2013-01-01 NOTE — Assessment & Plan Note (Signed)
She has done remarkably well following her percutaneous aortic valve replacement (29 mm core valve) performed in April. She is completely asymptomatic at this time. She has a followup echo scheduled at [facility] on a regular basis.

## 2013-01-01 NOTE — Assessment & Plan Note (Signed)
Severe coronary disease of the native coronary arteries with total occlusion of the LAD and right coronary arteries and high-grade stenosis of the proximal left circumflex coronary artery. Severe stenosis of the proximal saphenous vein graft to the right coronary artery but no significant stenoses in the other 2 bypass grafts (LIMA to LAD and SVG to OM). She is asymptomatic further revascularization procedures did not appear to be necessary at this time.

## 2013-01-01 NOTE — Assessment & Plan Note (Signed)
Physical exam and lipid levels to be checked by Dr. Felipa Eth next month.

## 2013-01-01 NOTE — Assessment & Plan Note (Signed)
Blood pressure is high today but she has had numerous recent blood pressure checks that have shown systolic blood pressure 130 beats per minute. No changes were made to her medications today

## 2013-01-01 NOTE — Progress Notes (Signed)
Patient ID: Becky Mcmahon, female   DOB: 12/21/1930, 79 y.o.   MRN: [id number]     Reason for office visit) Followup aortic valve disease, coronary artery disease  (status post CABG 1997, status post TAVR April 2014  [place] is doing remarkably well. She has had increase in energy and greatly improved functional status after her TAVR. Procedure, now over 4 months ago. She is due to have a physical exam and lab tests with dr. Felipa Eth in a couple of weeks.  Had developed headaches but these resolved when she discontinued Lopressor. She has no complaints.    Allergies  Allergen Reactions  . Contrast Media  [Iodinated Diagnostic Agents] Itching  . Other     Band Aids  . Latex Rash    Current Outpatient Prescriptions  Medication Sig Dispense Refill  . alendronate (FOSAMAX) 70 MG tablet Take 70 mg by mouth every 7 (seven) days. Take with a full glass of water on an empty stomach.      Marland Kitchen ascorbic acid (VITAMIN C) 1000 MG tablet Take 1,000 mg by mouth every morning.      Marland Kitchen aspirin EC 81 MG tablet Take 81 mg by mouth every morning.      Marland Kitchen atorvastatin (LIPITOR) 20 MG tablet Take 20 mg by mouth at bedtime.      . cholecalciferol (VITAMIN D) 1000 UNITS tablet Take 2,000 Units by mouth every morning.      Marland Kitchen esomeprazole (NEXIUM) 40 MG capsule Take 40 mg by mouth daily before breakfast.      . levothyroxine (SYNTHROID, LEVOTHROID) 50 MCG tablet Take 50 mcg by mouth daily before breakfast.      . lisinopril (PRINIVIL,ZESTRIL) 10 MG tablet Take 10 mg by mouth every morning.      . loratadine (CLARITIN) 10 MG tablet Take 10 mg by mouth every morning.      Marland Kitchen LORazepam (ATIVAN) 0.5 MG tablet Take 0.25-0.5 mg by mouth 2 (two) times daily. For anxiety      . Multiple Vitamin (MULTIVITAMIN WITH MINERALS) TABS tablet Take 1 tablet by mouth every morning.      . warfarin (COUMADIN) 5 MG tablet Take 5 mg by mouth daily. Or as directed       No current facility-administered medications for this visit.      Past Medical History  Diagnosis Date  . Severe aortic stenosis     2D ECHO, 04/22/2012 - EF 60-65%, aortic valve-heavily calcified with restricted leaflet motion, left atrium severly dilated  . CAD (coronary artery disease)   . Hyperlipidemia   . Osteoporosis   . Vitamin D deficiency   . Hypothyroidism   . GERD (gastroesophageal reflux disease)   . Hypertension   . Anxiety   . Pancreatitis   . Ovarian cyst   . Breast cancer     Rt mastectomy  . Guaiac positive stools   . S/P CABG x 5 10/25/1995    LIMA to LAD, sequential SVG to RI and OM, sequential SVG to RCA and PDA, open vein harvest from both thighs and legs - Dr Donata Clay  . Type II diabetes mellitus     Diet controlled  . Arthritis   . Hypercholesteremia   . DVT (deep venous thrombosis)     Recurrent, 1st episode after CABG 1997, 2nd episode after hip surgery 2001, placed on life-long coumadin ever since; LEXISCAN, 03/13/2008 - normal, no ECG changes, EKG negative for ischemia  . Swelling of right lower extremity  LEA VENOUS DUPLEX SCAN, 08/18/2011 - no evidence of acute thrombus or thrombophlebitis  . Cerebral atherosclerosis     CAROTID DOPPLER, 06/18/2009 - RIGHT/LEFT BULB AND PROXIMAL ICAs-0-49% diameter reduction    Past Surgical History  Procedure Laterality Date  . Coronary artery bypass graft  10/25/1995    x5 , Dr Donata Clay  . Right mastectomy  1979  . Laparotomy    . Hip arthroplasty      right  . Cardiolite myocardial perfusion study  06/13/2004    Normal static and dynamic myocardial perfusion images with EF of 73%    Family History  Problem Relation Age of Onset  . Pancreatitis Mother   . Heart disease Father     family HX  . Heart disease Sister   . Heart disease Brother   . Heart disease Brother 76  . Heart disease Sister 80    History   Social History  . Marital Status: Married    Spouse Name: N/A    Number of Children: N/A  . Years of Education: N/A   Occupational History  .  retired grade school teacher [employer]   Social History Main Topics  . Smoking status: Former Smoker -- 0.50 packs/day for 19 years    Quit date: 05/24/1969  . Smokeless tobacco: Never Used  . Alcohol Use: No  . Drug Use: Not on file  . Sexual Activity: Not on file   Other Topics Concern  . Not on file   Social History Narrative   Lives with husband and 1 daughter, 2nd daughter lives in [place]   Remains physically active and functionally independent   Active in church    Review of systems: The patient specifically denies any chest pain at rest or with exertion, dyspnea at rest or with exertion, orthopnea, paroxysmal nocturnal dyspnea, syncope, palpitations, focal neurological deficits, intermittent claudication, lower extremity edema, unexplained weight gain, cough, hemoptysis or wheezing.  The patient also denies abdominal pain, nausea, vomiting, dysphagia, diarrhea, constipation, polyuria, polydipsia, dysuria, hematuria, frequency, urgency, abnormal bleeding or bruising, fever, chills, unexpected weight changes, mood swings, change in skin or hair texture, change in voice quality, auditory or visual problems, allergic reactions or rashes, new musculoskeletal complaints other than usual "aches and pains".   PHYSICAL EXAM BP 148/62  Pulse 80  Ht 5\' 4"  (1.626 m)  Wt 168 lb 12.8 oz (76.567 kg)  BMI 28.96 kg/m2  General: Alert, oriented x3, no distress Head: no evidence of trauma, PERRL, EOMI, no exophtalmos or lid lag, no myxedema, no xanthelasma; normal ears, nose and oropharynx Neck: normal jugular venous pulsations and no hepatojugular reflux; brisk carotid pulses without delay and no carotid bruits Chest: clear to auscultation, no signs of consolidation by percussion or palpation, normal fremitus, symmetrical and full respiratory excursions Cardiovascular: normal position and quality of the apical impulse, regular rhythm, normal first and second heart sounds, no  murmurs, rubs or gallops.  Abdomen: no tenderness or distention, no masses by palpation, no abnormal pulsatility or arterial bruits, normal bowel sounds, no hepatosplenomegaly Extremities: no clubbing, cyanosis or edema; 2+ radial, ulnar and brachial pulses bilaterally; 2+ right femoral, posterior tibial and dorsalis pedis pulses; 2+ left femoral, posterior tibial and dorsalis pedis pulses; no subclavian or femoral bruits Neurological: grossly nonfocal   EKG: Sinus rhythm, incomplete right bundle branch block  Lipid Panel  September 2013 total cholesterol 177, Triglycerides 370, HDL 43, LDL 60, hemoglobin A1c 6.1%  BMET January 2014 creatinine 0.98, normal electrolytes,  hemoglobin 11.8   ASSESSMENT AND PLAN Aortic valve disorders She has done remarkably well following her percutaneous aortic valve replacement (29 mm core valve) performed in April. She is completely asymptomatic at this time. She has a followup echo scheduled at [facility] on a regular basis.  CAD (coronary artery disease) Severe coronary disease of the native coronary arteries with total occlusion of the LAD and right coronary arteries and high-grade stenosis of the proximal left circumflex coronary artery. Severe stenosis of the proximal saphenous vein graft to the right coronary artery but no significant stenoses in the other 2 bypass grafts (LIMA to LAD and SVG to OM). She is asymptomatic further revascularization procedures did not appear to be necessary at this time.    Hypertension Blood pressure is high today but she has had numerous recent blood pressure checks that have shown systolic blood pressure 130 beats per minute. No changes were made to her medications today  Hypercholesteremia Physical exam and lipid levels to be checked by Dr. Felipa Eth next month.  DVT (deep venous thrombosis) She has had 2 episodes of DVT both of them occurring after surgical procedures (bypass 1997 and hip surgery 2001) she is on warfarin  with appropriate anticoagulation levels.   No orders of the defined types were placed in this encounter.   No orders of the defined types were placed in this encounter.    Junious Silk, MD, [facility] [phone] office [phone] pager

## 2013-01-01 NOTE — Assessment & Plan Note (Signed)
She has had 2 episodes of DVT both of them occurring after surgical procedures (bypass 1997 and hip surgery 2001) she is on warfarin with appropriate anticoagulation levels.

## 2013-01-02 ENCOUNTER — Encounter (HOSPITAL_COMMUNITY): Admission: RE | Admit: 2013-01-02 | Payer: Medicare Other | Source: Ambulatory Visit

## 2013-01-02 ENCOUNTER — Telehealth (HOSPITAL_COMMUNITY): Payer: Self-pay | Admitting: *Deleted

## 2013-01-04 ENCOUNTER — Encounter (HOSPITAL_COMMUNITY): Payer: Medicare Other

## 2013-01-06 ENCOUNTER — Encounter (HOSPITAL_COMMUNITY): Payer: Medicare Other

## 2013-01-09 ENCOUNTER — Encounter (HOSPITAL_COMMUNITY): Payer: Medicare Other

## 2013-01-11 ENCOUNTER — Encounter (HOSPITAL_COMMUNITY): Payer: Medicare Other

## 2013-01-13 ENCOUNTER — Encounter (HOSPITAL_COMMUNITY): Payer: Medicare Other

## 2013-01-16 ENCOUNTER — Encounter (HOSPITAL_COMMUNITY): Payer: Medicare Other

## 2013-01-18 ENCOUNTER — Encounter (HOSPITAL_COMMUNITY): Payer: Medicare Other

## 2013-01-20 ENCOUNTER — Encounter (HOSPITAL_COMMUNITY): Payer: Medicare Other

## 2013-01-23 ENCOUNTER — Encounter (HOSPITAL_COMMUNITY): Payer: Medicare Other

## 2013-01-25 ENCOUNTER — Encounter (HOSPITAL_COMMUNITY): Payer: Medicare Other

## 2013-01-27 ENCOUNTER — Encounter (HOSPITAL_COMMUNITY): Payer: Medicare Other

## 2013-01-30 ENCOUNTER — Encounter (HOSPITAL_COMMUNITY): Payer: Medicare Other

## 2013-02-01 ENCOUNTER — Encounter (HOSPITAL_COMMUNITY): Payer: Medicare Other

## 2013-02-03 ENCOUNTER — Encounter (HOSPITAL_COMMUNITY): Payer: Medicare Other

## 2013-02-06 ENCOUNTER — Encounter (HOSPITAL_COMMUNITY): Payer: Medicare Other

## 2013-02-08 ENCOUNTER — Encounter (HOSPITAL_COMMUNITY): Payer: Medicare Other

## 2013-02-10 ENCOUNTER — Encounter (HOSPITAL_COMMUNITY): Payer: Medicare Other

## 2013-02-13 ENCOUNTER — Encounter (HOSPITAL_COMMUNITY): Payer: Medicare Other

## 2013-02-15 ENCOUNTER — Encounter (HOSPITAL_COMMUNITY): Payer: Medicare Other

## 2013-02-17 ENCOUNTER — Encounter (HOSPITAL_COMMUNITY): Payer: Medicare Other

## 2013-02-17 DIAGNOSIS — Z7189 Other specified counseling: Secondary | ICD-10-CM | POA: Insufficient documentation

## 2013-02-20 ENCOUNTER — Encounter (HOSPITAL_COMMUNITY): Payer: Medicare Other

## 2013-02-22 ENCOUNTER — Encounter (HOSPITAL_COMMUNITY): Payer: Medicare Other

## 2013-02-24 ENCOUNTER — Encounter (HOSPITAL_COMMUNITY): Payer: Medicare Other

## 2013-02-27 ENCOUNTER — Encounter (HOSPITAL_COMMUNITY): Payer: Medicare Other

## 2013-03-01 ENCOUNTER — Encounter (HOSPITAL_COMMUNITY): Payer: Medicare Other

## 2013-03-03 ENCOUNTER — Encounter (HOSPITAL_COMMUNITY): Payer: Medicare Other

## 2013-03-06 ENCOUNTER — Encounter (HOSPITAL_COMMUNITY): Payer: Medicare Other

## 2013-03-08 ENCOUNTER — Encounter (HOSPITAL_COMMUNITY): Payer: Medicare Other

## 2013-03-10 ENCOUNTER — Encounter (HOSPITAL_COMMUNITY): Payer: Medicare Other

## 2013-03-13 ENCOUNTER — Encounter (HOSPITAL_COMMUNITY): Payer: Medicare Other

## 2013-03-15 ENCOUNTER — Encounter (HOSPITAL_COMMUNITY): Payer: Medicare Other

## 2013-03-16 ENCOUNTER — Other Ambulatory Visit: Payer: Self-pay

## 2013-03-17 ENCOUNTER — Encounter (HOSPITAL_COMMUNITY): Payer: Medicare Other

## 2013-03-20 ENCOUNTER — Encounter (HOSPITAL_COMMUNITY): Payer: Medicare Other

## 2013-03-22 ENCOUNTER — Encounter (HOSPITAL_COMMUNITY): Payer: Medicare Other

## 2013-03-24 ENCOUNTER — Encounter (HOSPITAL_COMMUNITY): Payer: Medicare Other

## 2013-04-10 ENCOUNTER — Other Ambulatory Visit: Payer: Self-pay | Admitting: Cardiovascular Disease

## 2013-04-10 NOTE — Telephone Encounter (Signed)
Rx was sent to pharmacy electronically. 

## 2013-06-20 ENCOUNTER — Ambulatory Visit: Payer: Medicare Other | Admitting: Cardiovascular Disease

## 2013-06-22 ENCOUNTER — Encounter: Payer: Self-pay | Admitting: Cardiovascular Disease

## 2013-06-22 ENCOUNTER — Ambulatory Visit (INDEPENDENT_AMBULATORY_CARE_PROVIDER_SITE_OTHER): Payer: Medicare Other | Admitting: Cardiovascular Disease

## 2013-06-22 VITALS — BP 130/62 | HR 85 | Resp 20 | Ht 64.0 in | Wt 175.6 lb

## 2013-06-22 DIAGNOSIS — I350 Nonrheumatic aortic (valve) stenosis: Secondary | ICD-10-CM

## 2013-06-22 DIAGNOSIS — E7800 Pure hypercholesterolemia, unspecified: Secondary | ICD-10-CM

## 2013-06-22 DIAGNOSIS — I2510 Atherosclerotic heart disease of native coronary artery without angina pectoris: Secondary | ICD-10-CM

## 2013-06-22 DIAGNOSIS — I359 Nonrheumatic aortic valve disorder, unspecified: Secondary | ICD-10-CM

## 2013-06-22 NOTE — Patient Instructions (Signed)
Your physician recommends that you schedule a follow-up appointment in: ONE YEAR with Dr.Croitoru  

## 2013-06-22 NOTE — Progress Notes (Signed)
Patient ID: Becky Mcmahon, female   DOB: June 08, 1930, 80 y.o.   MRN: [id number]     Reason for office visit AS s/p TAVR CAD s/p CABG Hx of recurrent DVT  Becky Mcmahon is doing well from a cardiovascular standpoint. Roughly one year after her TAVR (29 mm CoreValve, Dr. Ysidro Evert, [facility]) she is essentially asymptomatic. She denies exertional dyspnea or angina. She has minimal if any ankle swelling. She has not experienced palpitations or syncope. Arthritis symptoms limiting her more than anything else. She had a full physical and lab test performed by Dr. Dagmar Hait and the results were satisfactory according to the patient.  18 years have passed since she underwent bypass surgery. She is treated hypercholesterolemia and hypertension. On chronic warfarin anticoagulation secondary to recurrent DVT no bleeding complications and no new thrombotic events.    Allergies  Allergen Reactions  . Contrast Media  [Iodinated Diagnostic Agents] Itching  . Other     Band Aids  . Latex Rash    Current Outpatient Prescriptions  Medication Sig Dispense Refill  . alendronate (FOSAMAX) 70 MG tablet Take 70 mg by mouth every 7 (seven) days. Take with a full glass of water on an empty stomach.      Marland Kitchen ascorbic acid (VITAMIN C) 1000 MG tablet Take 1,000 mg by mouth every morning.      Marland Kitchen aspirin EC 81 MG tablet Take 81 mg by mouth every morning.      Marland Kitchen atorvastatin (LIPITOR) 20 MG tablet TAKE 1 TABLET DAILY AS DIRECTED.  90 tablet  2  . cholecalciferol (VITAMIN D) 1000 UNITS tablet Take 2,000 Units by mouth every morning.      Marland Kitchen esomeprazole (NEXIUM) 40 MG capsule Take 40 mg by mouth daily before breakfast.      . levothyroxine (SYNTHROID, LEVOTHROID) 50 MCG tablet Take 50 mcg by mouth daily before breakfast.      . lisinopril (PRINIVIL,ZESTRIL) 10 MG tablet Take 10 mg by mouth every morning.      . loratadine (CLARITIN) 10 MG tablet Take 10 mg by mouth daily as needed.       Marland Kitchen LORazepam (ATIVAN) 0.5 MG tablet  Take 0.25-0.5 mg by mouth 2 (two) times daily. For anxiety      . Multiple Vitamin (MULTIVITAMIN WITH MINERALS) TABS tablet Take 1 tablet by mouth every morning.      . warfarin (COUMADIN) 5 MG tablet Take 5 mg by mouth daily. Or as directed       No current facility-administered medications for this visit.    Past Medical History  Diagnosis Date  . Severe aortic stenosis     2D ECHO, 04/22/2012 - EF 60-65%, aortic valve-heavily calcified with restricted leaflet motion, left atrium severly dilated  . CAD (coronary artery disease)   . Hyperlipidemia   . Osteoporosis   . Vitamin D deficiency   . Hypothyroidism   . GERD (gastroesophageal reflux disease)   . Hypertension   . Anxiety   . Pancreatitis   . Ovarian cyst   . Breast cancer     Rt mastectomy  . Guaiac positive stools   . S/P CABG x 5 10/25/1995    LIMA to LAD, sequential SVG to RI and OM, sequential SVG to RCA and PDA, open vein harvest from both thighs and legs - Dr Prescott Gum  . Type II diabetes mellitus     Diet controlled  . Arthritis   . Hypercholesteremia   . DVT (deep venous thrombosis)  Recurrent, 1st episode after CABG 1997, 2nd episode after hip surgery 2001, placed on life-long coumadin ever since; LEXISCAN, 03/13/2008 - normal, no ECG changes, EKG negative for ischemia  . Swelling of right lower extremity     LEA VENOUS DUPLEX SCAN, 08/18/2011 - no evidence of acute thrombus or thrombophlebitis  . Cerebral atherosclerosis     CAROTID DOPPLER, 06/18/2009 - RIGHT/LEFT BULB AND PROXIMAL ICAs-0-49% diameter reduction    Past Surgical History  Procedure Laterality Date  . Coronary artery bypass graft  10/25/1995    x5 , Dr Prescott Gum  . Right mastectomy  1979  . Laparotomy    . Hip arthroplasty      right  . Cardiolite myocardial perfusion study  06/13/2004    Normal static and dynamic myocardial perfusion images with EF of 73%    Family History  Problem Relation Age of Onset  . Pancreatitis Mother   . Heart  disease Father     family HX  . Heart disease Sister   . Heart disease Brother   . Heart disease Brother 45  . Heart disease Sister 23    History   Social History  . Marital Status: Married    Spouse Name: N/A    Number of Children: N/A  . Years of Education: N/A   Occupational History  . 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 Smoking status: Former Smoker -- 0.50 packs/day for 19 years    Quit date: 05/24/1969  . Smokeless tobacco: Never Used  . Alcohol Use: No  . Drug Use: Not on file  . Sexual Activity: Not on file   Other Topics Concern  . Not on file   Social History Narrative   Lives with husband and 1 daughter, 2nd daughter lives in [place] physically active and functionally independent   Active in church    Review of systems: The patient specifically denies any chest pain at rest or with exertion, dyspnea at rest or with exertion, orthopnea, paroxysmal nocturnal dyspnea, syncope, palpitations, focal neurological deficits, intermittent claudication, lower extremity edema, unexplained weight gain, cough, hemoptysis or wheezing.  The patient also denies abdominal pain, nausea, vomiting, dysphagia, diarrhea, constipation, polyuria, polydipsia, dysuria, hematuria, frequency, urgency, abnormal bleeding or bruising, fever, chills, unexpected weight changes, mood swings, change in skin or hair texture, change in voice quality, auditory or visual problems, allergic reactions or rashes, new musculoskeletal complaints other than usual "aches and pains".   PHYSICAL EXAM BP 130/62  Pulse 85  Resp 20  Ht 5\' 4"  (1.626 m)  Wt 79.652 kg (175 lb 9.6 oz)  BMI 30.13 kg/m2  General: Alert, oriented x3, no distress Head: no evidence of trauma, PERRL, EOMI, no exophtalmos or lid lag, no myxedema, no xanthelasma; normal ears, nose and oropharynx Neck: normal jugular venous pulsations and no hepatojugular reflux; brisk carotid  pulses without delay and no carotid bruits Chest: clear to auscultation, no signs of consolidation by percussion or palpation, normal fremitus, symmetrical and full respiratory excursions Cardiovascular: normal position and quality of the apical impulse, regular rhythm, normal first and second heart sounds, no rubs or gallops, to 2/6 systolic ejection murmur, early peaking in the aortic focus Abdomen: no tenderness or distention, no masses by palpation, no abnormal pulsatility or arterial bruits, normal bowel sounds, no hepatosplenomegaly Extremities: no clubbing, cyanosis or edema; 2+ radial, ulnar and brachial pulses bilaterally; 2+ right femoral, posterior tibial and dorsalis pedis pulses; 2+  left femoral, posterior tibial and dorsalis pedis pulses; no subclavian or femoral bruits Neurological: grossly nonfocal   EKG: Sinus rhythm, minor IVCD, early RS transition in lead V1 and V2, nonspecific ST changes, unchanged  Echo at [facility] showed mild left ventricle hypertrophy and mild diastolic dysfunction normal LVEF, greater than 55% And no aortic insufficiency, Aortic valve peak gradient 12 mm Hg, mean gradient 6 mm Hg, effective orifice area 1.8 cm square, DOI 0.62   ASSESSMENT AND PLAN  From a cardiovascular point Mrs. Port is doing well. She has no symptoms of active coronary insufficiency or valvular problems. She is due another echocardiogram for followup of her prosthetic aortic valve in April at the [facility] [place] get the results of her latest laboratory tests from Dr. Danna Hefty office. She'll followup on a yearly basis, sooner if she develops symptoms.  Orders Placed This Encounter  Procedures  . EKG 12-Lead   No orders of the defined types were placed in this encounter.    Holli Humbles, MD, [place] [phone] office [phone] pager

## 2013-06-26 LAB — PROTIME-INR

## 2013-07-03 ENCOUNTER — Encounter: Payer: Self-pay | Admitting: Cardiovascular Disease

## 2013-08-26 ENCOUNTER — Emergency Department (HOSPITAL_COMMUNITY)
Admission: EM | Admit: 2013-08-26 | Discharge: 2013-08-26 | Disposition: A | Discharge status: Home / Self Care | Payer: Medicare Other | Attending: Emergency Medicine | Admitting: Emergency Medicine

## 2013-08-26 ENCOUNTER — Encounter (HOSPITAL_COMMUNITY): Payer: Self-pay | Admitting: Emergency Medicine

## 2013-08-26 ENCOUNTER — Emergency Department (HOSPITAL_COMMUNITY): Payer: Medicare Other

## 2013-08-26 DIAGNOSIS — Z91040 Latex allergy status: Secondary | ICD-10-CM | POA: Insufficient documentation

## 2013-08-26 DIAGNOSIS — M129 Arthropathy, unspecified: Secondary | ICD-10-CM | POA: Insufficient documentation

## 2013-08-26 DIAGNOSIS — S20212A Contusion of left front wall of thorax, initial encounter: Secondary | ICD-10-CM

## 2013-08-26 DIAGNOSIS — M810 Age-related osteoporosis without current pathological fracture: Secondary | ICD-10-CM | POA: Insufficient documentation

## 2013-08-26 DIAGNOSIS — Y9389 Activity, other specified: Secondary | ICD-10-CM | POA: Insufficient documentation

## 2013-08-26 DIAGNOSIS — Z87891 Personal history of nicotine dependence: Secondary | ICD-10-CM | POA: Insufficient documentation

## 2013-08-26 DIAGNOSIS — E559 Vitamin D deficiency, unspecified: Secondary | ICD-10-CM | POA: Insufficient documentation

## 2013-08-26 DIAGNOSIS — Z79899 Other long term (current) drug therapy: Secondary | ICD-10-CM | POA: Insufficient documentation

## 2013-08-26 DIAGNOSIS — X500XXA Overexertion from strenuous movement or load, initial encounter: Secondary | ICD-10-CM | POA: Insufficient documentation

## 2013-08-26 DIAGNOSIS — K219 Gastro-esophageal reflux disease without esophagitis: Secondary | ICD-10-CM | POA: Insufficient documentation

## 2013-08-26 DIAGNOSIS — Z86718 Personal history of other venous thrombosis and embolism: Secondary | ICD-10-CM | POA: Insufficient documentation

## 2013-08-26 DIAGNOSIS — Z951 Presence of aortocoronary bypass graft: Secondary | ICD-10-CM | POA: Insufficient documentation

## 2013-08-26 DIAGNOSIS — E785 Hyperlipidemia, unspecified: Secondary | ICD-10-CM | POA: Insufficient documentation

## 2013-08-26 DIAGNOSIS — Y9210 Unspecified residential institution as the place of occurrence of the external cause: Secondary | ICD-10-CM | POA: Insufficient documentation

## 2013-08-26 DIAGNOSIS — I2510 Atherosclerotic heart disease of native coronary artery without angina pectoris: Secondary | ICD-10-CM | POA: Insufficient documentation

## 2013-08-26 DIAGNOSIS — Z9010 Acquired absence of unspecified breast and nipple: Secondary | ICD-10-CM | POA: Insufficient documentation

## 2013-08-26 DIAGNOSIS — F411 Generalized anxiety disorder: Secondary | ICD-10-CM | POA: Insufficient documentation

## 2013-08-26 DIAGNOSIS — Z7901 Long term (current) use of anticoagulants: Secondary | ICD-10-CM | POA: Insufficient documentation

## 2013-08-26 DIAGNOSIS — S20219A Contusion of unspecified front wall of thorax, initial encounter: Secondary | ICD-10-CM | POA: Insufficient documentation

## 2013-08-26 DIAGNOSIS — E039 Hypothyroidism, unspecified: Secondary | ICD-10-CM | POA: Insufficient documentation

## 2013-08-26 DIAGNOSIS — Z853 Personal history of malignant neoplasm of breast: Secondary | ICD-10-CM | POA: Insufficient documentation

## 2013-08-26 DIAGNOSIS — E7800 Pure hypercholesterolemia, unspecified: Secondary | ICD-10-CM | POA: Insufficient documentation

## 2013-08-26 DIAGNOSIS — Z8742 Personal history of other diseases of the female genital tract: Secondary | ICD-10-CM | POA: Insufficient documentation

## 2013-08-26 DIAGNOSIS — Z7982 Long term (current) use of aspirin: Secondary | ICD-10-CM | POA: Insufficient documentation

## 2013-08-26 DIAGNOSIS — R0789 Other chest pain: Secondary | ICD-10-CM

## 2013-08-26 LAB — CBC
HEMATOCRIT: 35.8 % — AB (ref 36.0–46.0)
HEMOGLOBIN: 11.5 g/dL — AB (ref 12.0–15.0)
MCH: 24.6 pg — AB (ref 26.0–34.0)
MCHC: 32.1 g/dL (ref 30.0–36.0)
MCV: 76.5 fL — AB (ref 78.0–100.0)
Platelets: 221 10*3/uL (ref 150–400)
RBC: 4.68 MIL/uL (ref 3.87–5.11)
RDW: 15.3 % (ref 11.5–15.5)
WBC: 7.4 10*3/uL (ref 4.0–10.5)

## 2013-08-26 LAB — I-STAT TROPONIN, ED: Troponin i, poc: 0 ng/mL (ref 0.00–0.08)

## 2013-08-26 LAB — BASIC METABOLIC PANEL
BUN: 24 mg/dL — AB (ref 6–23)
CALCIUM: 9.3 mg/dL (ref 8.4–10.5)
CO2: 26 mEq/L (ref 19–32)
CREATININE: 1.12 mg/dL — AB (ref 0.50–1.10)
Chloride: 102 mEq/L (ref 96–112)
GFR, EST AFRICAN AMERICAN: 52 mL/min — AB (ref >90)
GFR, EST NON AFRICAN AMERICAN: 45 mL/min — AB (ref >90)
GLUCOSE: 119 mg/dL — AB (ref 70–99)
POTASSIUM: 4.3 meq/L (ref 3.7–5.3)
Sodium: 139 mEq/L (ref 137–147)

## 2013-08-26 LAB — PROTIME-INR
INR: 2.31 — ABNORMAL HIGH (ref 0.00–1.49)
PROTHROMBIN TIME: 24.6 s — AB (ref 11.6–15.2)

## 2013-08-26 NOTE — Discharge Instructions (Signed)
Would recommend taking tramadol as needed for the rib contusion pain. We would want you to return for development of fevers worse trouble breathing or worse pain. Also can followup with your regular doctor in the next few days as needed. It may take a long time for the rib to heal.

## 2013-08-26 NOTE — ED Notes (Signed)
Pt c/o L rib pain, under L breast x 2 days, soreness.

## 2013-08-26 NOTE — ED Notes (Signed)
Pt. Denied of CHEST PAIN but  claimed of more rib pain when bending over as she reached something on the floor two days ago.

## 2013-08-26 NOTE — ED Provider Notes (Signed)
CSN: [id number]     Arrival date & time 08/26/13  1938 History   First MD Initiated Contact with Patient 08/26/13 2010     Chief Complaint  Patient presents with  . Chest Pain     (Consider location/radiation/quality/duration/timing/severity/associated sxs/prior Treatment) Patient is a 80 y.o. female presenting with chest pain. The history is provided by the patient.  Chest Pain Associated symptoms: no abdominal pain, no back pain, no fever, no headache and no shortness of breath    patient with 2 day history of left anterior chest pain. Tender to the patient along the rib margin. Patient was bending over when she got sudden pain in that area as per remained sore since then. It is worse with trying to sit up or cough. States the pain at its worse is 8/10. Nonradiating. Not associated with shortness of breath nausea or vomiting.  Past Medical History  Diagnosis Date  . Severe aortic stenosis     2D ECHO, 04/22/2012 - EF 60-65%, aortic valve-heavily calcified with restricted leaflet motion, left atrium severly dilated  . CAD (coronary artery disease)   . Hyperlipidemia   . Osteoporosis   . Vitamin D deficiency   . Hypothyroidism   . GERD (gastroesophageal reflux disease)   . Hypertension   . Anxiety   . Pancreatitis   . Ovarian cyst   . Breast cancer     Rt mastectomy  . Guaiac positive stools   . S/P CABG x 5 10/25/1995    LIMA to LAD, sequential SVG to RI and OM, sequential SVG to RCA and PDA, open vein harvest from both thighs and legs - Dr Prescott Gum  . Type II diabetes mellitus     Diet controlled  . Arthritis   . Hypercholesteremia   . DVT (deep venous thrombosis)     Recurrent, 1st episode after CABG 1997, 2nd episode after hip surgery 2001, placed on life-long coumadin ever since; LEXISCAN, 03/13/2008 - normal, no ECG changes, EKG negative for ischemia  . Swelling of right lower extremity     LEA VENOUS DUPLEX SCAN, 08/18/2011 - no evidence of acute thrombus or  thrombophlebitis  . Cerebral atherosclerosis     CAROTID DOPPLER, 06/18/2009 - RIGHT/LEFT BULB AND PROXIMAL ICAs-0-49% diameter reduction   Past Surgical History  Procedure Laterality Date  . Coronary artery bypass graft  10/25/1995    x5 , Dr Prescott Gum  . Right mastectomy  1979  . Laparotomy    . Hip arthroplasty      right  . Cardiolite myocardial perfusion study  06/13/2004    Normal static and dynamic myocardial perfusion images with EF of 73%  . Aortic valve replacement     Family History  Problem Relation Age of Onset  . Pancreatitis Mother   . Heart disease Father     family HX  . Heart disease Sister   . Heart disease Brother   . Heart disease Brother 37  . Heart disease Sister 46   History  Substance Use Topics  . Smoking status: Former Smoker -- 0.50 packs/day for 19 years    Quit date: 05/24/1969  . Smokeless tobacco: Never Used  . Alcohol Use: No   OB History   Grav Para Term Preterm Abortions TAB SAB Ect Mult Living                 Review of Systems  Constitutional: Negative for fever.  HENT: Negative for congestion.   Respiratory: Negative for shortness  of breath.   Cardiovascular: Positive for chest pain.  Gastrointestinal: Negative for abdominal pain.  Genitourinary: Negative for dysuria.  Musculoskeletal: Negative for back pain.  Skin: Negative for rash.  Neurological: Negative for headaches.  Hematological: Bruises/bleeds easily.  Psychiatric/Behavioral: Negative for confusion.      Allergies  Contrast media ; Other; and Latex  Home Medications   Prior to Admission medications   Medication Sig Start Date End Date Taking? Authorizing Provider  alendronate (FOSAMAX) 70 MG tablet Take 70 mg by mouth every 7 (seven) days. Take with a full glass of water on an empty stomach. Takes on Saturdays   Yes Historical Provider, MD  ascorbic acid (VITAMIN C) 1000 MG tablet Take 1,000 mg by mouth every morning.   Yes Historical Provider, MD  aspirin EC 81  MG tablet Take 81 mg by mouth every morning.   Yes Historical Provider, MD  atorvastatin (LIPITOR) 20 MG tablet TAKE 1 TABLET DAILY AS DIRECTED. 04/10/13  Yes Mihai Croitoru, MD  cholecalciferol (VITAMIN D) 1000 UNITS tablet Take 2,000 Units by mouth every morning.   Yes Historical Provider, MD  esomeprazole (NEXIUM) 40 MG capsule Take 40 mg by mouth daily before breakfast.   Yes Historical Provider, MD  levothyroxine (SYNTHROID, LEVOTHROID) 50 MCG tablet Take 50 mcg by mouth daily before breakfast.   Yes Historical Provider, MD  lisinopril (PRINIVIL,ZESTRIL) 10 MG tablet Take 10 mg by mouth every morning.   Yes Historical Provider, MD  loratadine (CLARITIN) 10 MG tablet Take 10 mg by mouth daily as needed (seasonal allergies).    Yes Historical Provider, MD  LORazepam (ATIVAN) 0.5 MG tablet Take 0.25-0.5 mg by mouth 2 (two) times daily. For anxiety   Yes Historical Provider, MD  Multiple Vitamin (MULTIVITAMIN WITH MINERALS) TABS tablet Take 1 tablet by mouth every morning.   Yes Historical Provider, MD  warfarin (COUMADIN) 5 MG tablet Take 5 mg by mouth daily. Or as directed   Yes Historical Provider, MD   BP 163/69  Pulse 82  Temp(Src) 97.9 F (36.6 C) (Oral)  Resp 20  Ht 5\' 4"  (1.626 m)  Wt 173 lb (78.472 kg)  BMI 29.68 kg/m2  SpO2 98% Physical Exam  Nursing note and vitals reviewed. Constitutional: She is oriented to person, place, and time. She appears well-developed and well-nourished. No distress.  HENT:  Head: Normocephalic and atraumatic.  Eyes: Conjunctivae and EOM are normal. Pupils are equal, round, and reactive to light.  Neck: Normal range of motion.  Cardiovascular: Normal rate, regular rhythm and normal heart sounds.   Pulmonary/Chest: Effort normal and breath sounds normal. No respiratory distress. She exhibits tenderness.  Abdominal: Soft. Bowel sounds are normal. There is no tenderness.  Musculoskeletal: Normal range of motion. She exhibits no edema.  Neurological:  She is alert and oriented to person, place, and time. No cranial nerve deficit. She exhibits normal muscle tone. Coordination normal.  Skin: Skin is warm.    ED Course  Procedures (including critical care time) Labs Review Labs Reviewed  CBC - Abnormal; Notable for the following:    Hemoglobin 11.5 (*)    HCT 35.8 (*)    MCV 76.5 (*)    MCH 24.6 (*)    All other components within normal limits  BASIC METABOLIC PANEL - Abnormal; Notable for the following:    Glucose, Bld 119 (*)    BUN 24 (*)    Creatinine, Ser 1.12 (*)    GFR calc non Af Amer 45 (*)  GFR calc Af Amer 52 (*)    All other components within normal limits  PROTIME-INR - Abnormal; Notable for the following:    Prothrombin Time 24.6 (*)    INR 2.31 (*)    All other components within normal limits  I-STAT TROPOININ, ED   Results for orders placed during the hospital encounter of 08/26/13  CBC      Result Value Ref Range   WBC 7.4  4.0 - 10.5 K/uL   RBC 4.68  3.87 - 5.11 MIL/uL   Hemoglobin 11.5 (*) 12.0 - 15.0 g/dL   HCT 35.8 (*) 36.0 - 46.0 %   MCV 76.5 (*) 78.0 - 100.0 fL   MCH 24.6 (*) 26.0 - 34.0 pg   MCHC 32.1  30.0 - 36.0 g/dL   RDW 15.3  11.5 - 15.5 %   Platelets 221  150 - 400 K/uL  BASIC METABOLIC PANEL      Result Value Ref Range   Sodium 139  137 - 147 mEq/L   Potassium 4.3  3.7 - 5.3 mEq/L   Chloride 102  96 - 112 mEq/L   CO2 26  19 - 32 mEq/L   Glucose, Bld 119 (*) 70 - 99 mg/dL   BUN 24 (*) 6 - 23 mg/dL   Creatinine, Ser 1.12 (*) 0.50 - 1.10 mg/dL   Calcium 9.3  8.4 - 10.5 mg/dL   GFR calc non Af Amer 45 (*) >90 mL/min   GFR calc Af Amer 52 (*) >90 mL/min  PROTIME-INR      Result Value Ref Range   Prothrombin Time 24.6 (*) 11.6 - 15.2 seconds   INR 2.31 (*) 0.00 - 1.49  I-STAT TROPOININ, ED      Result Value Ref Range   Troponin i, poc 0.00  0.00 - 0.08 ng/mL   Comment 3              Imaging Review Dg Ribs Unilateral W/chest Left  08/26/2013   CLINICAL DATA:  Left chest and rib  pain.  EXAM: LEFT RIBS AND CHEST - 3+ VIEW  COMPARISON:  01/16/2010  FINDINGS: No fracture or other bone lesions are seen involving the ribs. There is no evidence of pneumothorax or pleural effusion. Both lungs are clear. Heart size and mediastinal contours are within normal limits. Prior CABG again noted. Endoluminal stent graft noted in the aortic root. Surgical clips seen in the right axilla.  IMPRESSION: No acute findings.  No active cardiopulmonary disease.   Electronically Signed   By: Earle Gell M.D.   On: 08/26/2013 20:54     EKG Interpretation   Date/Time:  Saturday August 26 2013 20:02:55 EDT Ventricular Rate:  82 PR Interval:  165 QRS Duration: 95 QT Interval:  384 QTC Calculation: 448 R Axis:   45 Text Interpretation:  Sinus rhythm Consider RVH or posterior infarct No  significant change since last tracing Confirmed by Win Guajardo  MD, Keidy Thurgood  [phone]) on 08/26/2013 8:12:12 PM      MDM   Final diagnoses:  Chest wall pain  Contusion of rib on left side    Patient with complaint of left-sided rib margin pain for 2 days. Patient was bending over 2 days ago when get discomfort in that area. Remain sore worse with coughing or sudden movements. Also harder to sit up. Patient is on Coumadin for a history of blood clots in the past also may be on Coumadin because she has a sheath in one of her  heart valves that was done at [facility]. Workup here negative patient does have tender to palpation over the left anterior rib margin. X-rays of the chest were negative for pneumothorax pulmonary edema or pneumonia. In addition it did not show any rib injury but ribs in that area predominantly cartilage. Patient has tramadol at home. Patient's labs without significant abnormalities. EKG without any acute changes troponin was negative no evidence of acute cardiac event. Patient's INR is 2.3.    Mervin Kung, MD 08/26/13 2220

## 2014-01-29 ENCOUNTER — Other Ambulatory Visit: Payer: Self-pay | Admitting: Cardiovascular Disease

## 2014-01-29 NOTE — Telephone Encounter (Signed)
Rx was sent to pharmacy electronically. 

## 2014-04-19 ENCOUNTER — Encounter (HOSPITAL_COMMUNITY): Payer: Self-pay | Admitting: Cardiovascular Disease

## 2014-07-10 ENCOUNTER — Other Ambulatory Visit: Payer: Self-pay | Admitting: Cardiovascular Disease

## 2014-07-10 NOTE — Telephone Encounter (Signed)
Rx refill sent to patient pharmacy   

## 2014-07-23 ENCOUNTER — Ambulatory Visit
Admission: RE | Admit: 2014-07-23 | Discharge: 2014-07-23 | Disposition: A | Discharge status: Home / Self Care | Payer: Medicare Other | Source: Ambulatory Visit | Attending: Internal Medicine | Admitting: Internal Medicine

## 2014-07-23 ENCOUNTER — Other Ambulatory Visit: Payer: Self-pay | Admitting: Internal Medicine

## 2014-07-23 ENCOUNTER — Inpatient Hospital Stay
Admission: RE | Admit: 2014-07-23 | Discharge: 2014-07-23 | Disposition: A | Discharge status: Home / Self Care | Payer: Self-pay | Source: Ambulatory Visit | Attending: Internal Medicine | Admitting: Internal Medicine

## 2014-07-23 DIAGNOSIS — IMO0002 Reserved for concepts with insufficient information to code with codable children: Secondary | ICD-10-CM

## 2014-07-23 DIAGNOSIS — R52 Pain, unspecified: Secondary | ICD-10-CM

## 2014-07-24 ENCOUNTER — Other Ambulatory Visit: Payer: Self-pay

## 2014-08-11 ENCOUNTER — Other Ambulatory Visit: Payer: Self-pay | Admitting: Cardiovascular Disease

## 2014-08-13 NOTE — Telephone Encounter (Signed)
Rx(s) sent to pharmacy electronically.  

## 2015-07-30 DIAGNOSIS — Z952 Presence of prosthetic heart valve: Secondary | ICD-10-CM | POA: Insufficient documentation

## 2016-03-25 ENCOUNTER — Encounter (HOSPITAL_BASED_OUTPATIENT_CLINIC_OR_DEPARTMENT_OTHER): Payer: Medicare Other | Attending: Internal Medicine

## 2016-03-25 DIAGNOSIS — K219 Gastro-esophageal reflux disease without esophagitis: Secondary | ICD-10-CM | POA: Insufficient documentation

## 2016-03-25 DIAGNOSIS — E782 Mixed hyperlipidemia: Secondary | ICD-10-CM | POA: Diagnosis not present

## 2016-03-25 DIAGNOSIS — I83023 Varicose veins of left lower extremity with ulcer of ankle: Secondary | ICD-10-CM | POA: Diagnosis not present

## 2016-03-25 DIAGNOSIS — I890 Lymphedema, not elsewhere classified: Secondary | ICD-10-CM | POA: Diagnosis not present

## 2016-03-25 DIAGNOSIS — E039 Hypothyroidism, unspecified: Secondary | ICD-10-CM | POA: Insufficient documentation

## 2016-03-25 DIAGNOSIS — Z951 Presence of aortocoronary bypass graft: Secondary | ICD-10-CM | POA: Diagnosis not present

## 2016-03-25 DIAGNOSIS — Z952 Presence of prosthetic heart valve: Secondary | ICD-10-CM | POA: Diagnosis not present

## 2016-03-25 DIAGNOSIS — Z7901 Long term (current) use of anticoagulants: Secondary | ICD-10-CM | POA: Insufficient documentation

## 2016-03-25 DIAGNOSIS — Z87891 Personal history of nicotine dependence: Secondary | ICD-10-CM | POA: Insufficient documentation

## 2016-03-25 DIAGNOSIS — I2510 Atherosclerotic heart disease of native coronary artery without angina pectoris: Secondary | ICD-10-CM | POA: Insufficient documentation

## 2016-03-25 DIAGNOSIS — Z9011 Acquired absence of right breast and nipple: Secondary | ICD-10-CM | POA: Insufficient documentation

## 2016-03-25 DIAGNOSIS — L97322 Non-pressure chronic ulcer of left ankle with fat layer exposed: Secondary | ICD-10-CM | POA: Insufficient documentation

## 2016-03-25 DIAGNOSIS — Z79899 Other long term (current) drug therapy: Secondary | ICD-10-CM | POA: Diagnosis not present

## 2016-03-25 DIAGNOSIS — Z7982 Long term (current) use of aspirin: Secondary | ICD-10-CM | POA: Insufficient documentation

## 2016-03-25 DIAGNOSIS — I10 Essential (primary) hypertension: Secondary | ICD-10-CM | POA: Diagnosis not present

## 2016-03-25 DIAGNOSIS — Z86718 Personal history of other venous thrombosis and embolism: Secondary | ICD-10-CM | POA: Insufficient documentation

## 2016-03-25 DIAGNOSIS — Z853 Personal history of malignant neoplasm of breast: Secondary | ICD-10-CM | POA: Insufficient documentation

## 2016-03-25 DIAGNOSIS — M810 Age-related osteoporosis without current pathological fracture: Secondary | ICD-10-CM | POA: Insufficient documentation

## 2016-03-27 ENCOUNTER — Ambulatory Visit (HOSPITAL_COMMUNITY)
Admission: RE | Admit: 2016-03-27 | Discharge: 2016-03-27 | Disposition: A | Discharge status: Home / Self Care | Payer: Medicare Other | Source: Ambulatory Visit | Attending: Vascular Surgery | Admitting: Vascular Surgery

## 2016-03-27 ENCOUNTER — Other Ambulatory Visit: Payer: Self-pay | Admitting: Surgery

## 2016-03-27 DIAGNOSIS — R600 Localized edema: Secondary | ICD-10-CM

## 2016-03-27 DIAGNOSIS — I868 Varicose veins of other specified sites: Secondary | ICD-10-CM | POA: Insufficient documentation

## 2016-03-27 DIAGNOSIS — X58XXXA Exposure to other specified factors, initial encounter: Secondary | ICD-10-CM | POA: Diagnosis not present

## 2016-03-27 DIAGNOSIS — S91002A Unspecified open wound, left ankle, initial encounter: Secondary | ICD-10-CM | POA: Diagnosis not present

## 2016-03-30 ENCOUNTER — Encounter (HOSPITAL_BASED_OUTPATIENT_CLINIC_OR_DEPARTMENT_OTHER): Payer: Medicare Other

## 2016-03-31 ENCOUNTER — Ambulatory Visit (HOSPITAL_COMMUNITY)
Admission: RE | Admit: 2016-03-31 | Discharge: 2016-03-31 | Disposition: A | Discharge status: Home / Self Care | Payer: Medicare Other | Source: Ambulatory Visit | Attending: Vascular Surgery | Admitting: Vascular Surgery

## 2016-03-31 ENCOUNTER — Other Ambulatory Visit: Payer: Self-pay | Admitting: Surgery

## 2016-03-31 DIAGNOSIS — L97329 Non-pressure chronic ulcer of left ankle with unspecified severity: Secondary | ICD-10-CM | POA: Diagnosis not present

## 2016-04-01 DIAGNOSIS — L97322 Non-pressure chronic ulcer of left ankle with fat layer exposed: Secondary | ICD-10-CM | POA: Diagnosis not present

## 2016-04-07 ENCOUNTER — Encounter (HOSPITAL_COMMUNITY): Payer: Self-pay | Admitting: Emergency Medicine

## 2016-04-07 ENCOUNTER — Emergency Department (HOSPITAL_COMMUNITY): Payer: Medicare Other

## 2016-04-07 ENCOUNTER — Emergency Department (HOSPITAL_COMMUNITY)
Admission: EM | Admit: 2016-04-07 | Discharge: 2016-04-08 | Disposition: A | Discharge status: Home / Self Care | Payer: Medicare Other | Attending: Emergency Medicine | Admitting: Emergency Medicine

## 2016-04-07 DIAGNOSIS — S52602A Unspecified fracture of lower end of left ulna, initial encounter for closed fracture: Secondary | ICD-10-CM | POA: Insufficient documentation

## 2016-04-07 DIAGNOSIS — Z7982 Long term (current) use of aspirin: Secondary | ICD-10-CM | POA: Diagnosis not present

## 2016-04-07 DIAGNOSIS — E039 Hypothyroidism, unspecified: Secondary | ICD-10-CM | POA: Diagnosis not present

## 2016-04-07 DIAGNOSIS — S2241XA Multiple fractures of ribs, right side, initial encounter for closed fracture: Secondary | ICD-10-CM | POA: Insufficient documentation

## 2016-04-07 DIAGNOSIS — I2510 Atherosclerotic heart disease of native coronary artery without angina pectoris: Secondary | ICD-10-CM | POA: Diagnosis not present

## 2016-04-07 DIAGNOSIS — Z955 Presence of coronary angioplasty implant and graft: Secondary | ICD-10-CM | POA: Insufficient documentation

## 2016-04-07 DIAGNOSIS — I10 Essential (primary) hypertension: Secondary | ICD-10-CM | POA: Insufficient documentation

## 2016-04-07 DIAGNOSIS — Y999 Unspecified external cause status: Secondary | ICD-10-CM | POA: Diagnosis not present

## 2016-04-07 DIAGNOSIS — Z91040 Latex allergy status: Secondary | ICD-10-CM | POA: Insufficient documentation

## 2016-04-07 DIAGNOSIS — Z853 Personal history of malignant neoplasm of breast: Secondary | ICD-10-CM | POA: Diagnosis not present

## 2016-04-07 DIAGNOSIS — Z79899 Other long term (current) drug therapy: Secondary | ICD-10-CM | POA: Insufficient documentation

## 2016-04-07 DIAGNOSIS — L03116 Cellulitis of left lower limb: Secondary | ICD-10-CM | POA: Diagnosis not present

## 2016-04-07 DIAGNOSIS — W1830XA Fall on same level, unspecified, initial encounter: Secondary | ICD-10-CM | POA: Insufficient documentation

## 2016-04-07 DIAGNOSIS — S5292XA Unspecified fracture of left forearm, initial encounter for closed fracture: Secondary | ICD-10-CM

## 2016-04-07 DIAGNOSIS — Y92009 Unspecified place in unspecified non-institutional (private) residence as the place of occurrence of the external cause: Secondary | ICD-10-CM | POA: Insufficient documentation

## 2016-04-07 DIAGNOSIS — Z7901 Long term (current) use of anticoagulants: Secondary | ICD-10-CM | POA: Insufficient documentation

## 2016-04-07 DIAGNOSIS — S299XXA Unspecified injury of thorax, initial encounter: Secondary | ICD-10-CM | POA: Diagnosis present

## 2016-04-07 DIAGNOSIS — E119 Type 2 diabetes mellitus without complications: Secondary | ICD-10-CM | POA: Diagnosis not present

## 2016-04-07 DIAGNOSIS — Y939 Activity, unspecified: Secondary | ICD-10-CM | POA: Insufficient documentation

## 2016-04-07 DIAGNOSIS — S0093XA Contusion of unspecified part of head, initial encounter: Secondary | ICD-10-CM | POA: Diagnosis not present

## 2016-04-07 DIAGNOSIS — S0990XA Unspecified injury of head, initial encounter: Secondary | ICD-10-CM | POA: Insufficient documentation

## 2016-04-07 DIAGNOSIS — Z96641 Presence of right artificial hip joint: Secondary | ICD-10-CM | POA: Insufficient documentation

## 2016-04-07 DIAGNOSIS — S52502A Unspecified fracture of the lower end of left radius, initial encounter for closed fracture: Secondary | ICD-10-CM | POA: Insufficient documentation

## 2016-04-07 DIAGNOSIS — Z87891 Personal history of nicotine dependence: Secondary | ICD-10-CM | POA: Diagnosis not present

## 2016-04-07 DIAGNOSIS — S52202A Unspecified fracture of shaft of left ulna, initial encounter for closed fracture: Secondary | ICD-10-CM

## 2016-04-07 LAB — COMPREHENSIVE METABOLIC PANEL
ALBUMIN: 4.2 g/dL (ref 3.5–5.0)
ALK PHOS: 84 U/L (ref 38–126)
ALT: 19 U/L (ref 14–54)
AST: 30 U/L (ref 15–41)
Anion gap: 9 (ref 5–15)
BILIRUBIN TOTAL: 0.4 mg/dL (ref 0.3–1.2)
BUN: 28 mg/dL — AB (ref 6–20)
CALCIUM: 9.3 mg/dL (ref 8.9–10.3)
CO2: 25 mmol/L (ref 22–32)
Chloride: 106 mmol/L (ref 101–111)
Creatinine, Ser: 0.96 mg/dL (ref 0.44–1.00)
GFR calc Af Amer: >60 mL/min (ref >60)
GFR calc non Af Amer: 53 mL/min — ABNORMAL LOW (ref >60)
GLUCOSE: 111 mg/dL — AB (ref 65–99)
POTASSIUM: 3.9 mmol/L (ref 3.5–5.1)
Sodium: 140 mmol/L (ref 135–145)
TOTAL PROTEIN: 8 g/dL (ref 6.5–8.1)

## 2016-04-07 LAB — CBC WITH DIFFERENTIAL/PLATELET
BASOS ABS: 0 10*3/uL (ref 0.0–0.1)
BASOS PCT: 1 %
Eosinophils Absolute: 0 10*3/uL (ref 0.0–0.7)
Eosinophils Relative: 0 %
HEMATOCRIT: 39.8 % (ref 36.0–46.0)
HEMOGLOBIN: 13.1 g/dL (ref 12.0–15.0)
Lymphocytes Relative: 21 %
Lymphs Abs: 1.7 10*3/uL (ref 0.7–4.0)
MCH: 25.3 pg — ABNORMAL LOW (ref 26.0–34.0)
MCHC: 32.9 g/dL (ref 30.0–36.0)
MCV: 77 fL — ABNORMAL LOW (ref 78.0–100.0)
Monocytes Absolute: 0.7 10*3/uL (ref 0.1–1.0)
Monocytes Relative: 9 %
NEUTROS ABS: 5.5 10*3/uL (ref 1.7–7.7)
NEUTROS PCT: 69 %
Platelets: 215 10*3/uL (ref 150–400)
RBC: 5.17 MIL/uL — ABNORMAL HIGH (ref 3.87–5.11)
RDW: 14.7 % (ref 11.5–15.5)
WBC: 8 10*3/uL (ref 4.0–10.5)

## 2016-04-07 LAB — LIPASE, BLOOD: Lipase: 25 U/L (ref 11–51)

## 2016-04-07 LAB — PROTIME-INR
INR: 2.19
Prothrombin Time: 24.7 seconds — ABNORMAL HIGH (ref 11.4–15.2)

## 2016-04-07 MED ORDER — FENTANYL CITRATE (PF) 100 MCG/2ML IJ SOLN
25.0000 ug | Freq: Once | INTRAMUSCULAR | Status: AC
  Administered 2016-04-07: 25 ug via INTRAVENOUS
  Filled 2016-04-07: qty 2

## 2016-04-07 MED ORDER — TRAMADOL HCL 50 MG PO TABS
50.0000 mg | ORAL_TABLET | Freq: Once | ORAL | Status: AC
  Administered 2016-04-07: 50 mg via ORAL
  Filled 2016-04-07: qty 1

## 2016-04-07 MED ORDER — CEPHALEXIN 500 MG PO CAPS
500.0000 mg | ORAL_CAPSULE | Freq: Once | ORAL | Status: AC
  Administered 2016-04-07: 500 mg via ORAL
  Filled 2016-04-07: qty 1

## 2016-04-07 MED ORDER — SODIUM CHLORIDE 0.9 % IV BOLUS (SEPSIS)
1000.0000 mL | Freq: Once | INTRAVENOUS | Status: AC
  Administered 2016-04-07: 1000 mL via INTRAVENOUS

## 2016-04-07 MED ORDER — BACITRACIN ZINC 500 UNIT/GM EX OINT
TOPICAL_OINTMENT | CUTANEOUS | Status: AC
  Administered 2016-04-08: 01:00:00
  Filled 2016-04-07: qty 0.9

## 2016-04-07 NOTE — ED Notes (Signed)
Pt unable to provide U/A per MD order. Pt currently being serviced by ortho tech. Will f/u re: U/A at later time. ENM

## 2016-04-07 NOTE — ED Provider Notes (Signed)
[facility] DEPT Provider Note   CSN: [id number] Arrival date & time: 04/07/16  1800     History   Chief Complaint Chief Complaint  Patient presents with  . Fall  . Wrist Pain  . rib cage pain    HPI Becky Mcmahon is a 82 y.o. female With a past medical history significant for coronary artery disease status post CABG, aortic valve placement, DVT on Coumadin, breast cancer, diabetes, thyroid disease, Hypertension, And hyperlipidemia who presents for a fall. Patient reports that she has had a wound on her left ankle for the last few weeks that she is being seen by the wound care team. She says that it has been more painful over the last few days causing her to favor that leg during walking. She reports using a cane for ambulation however, she had a mechanical fall this afternoon. Patient reports falling and hurting her left wrist, right ribs, and hitting her head. She did not lose consciousness but is complaining of pain in these three locations. She says her wrist pain is moderate to severe. She reports swelling but denies any lacerations. She is right-handed so the injuries on her non-dominant hand. She reports very mild headache and tenderness on her left for head where she struck the wall. She denies nausea, vomiting, or vision changes. She denies any focal neurologic deficits. She reports pleuritic chest pain on her lateral right chest wall. She denies back pain or back tenderness. She denies abdominal pain or abdominal tenderness. She denies any lower extremity numbness, tingling, or weakness different from prior.   She describes her pain as an eight out of 10 in severity.    The history is provided by the patient, the spouse and a relative.  Fall  This is a new problem. The current episode started 1 to 2 hours ago. The problem occurs rarely. The problem has not changed since onset.Associated symptoms include chest pain and headaches. Pertinent negatives include no abdominal  pain and no shortness of breath. The symptoms are aggravated by twisting. Nothing relieves the symptoms. She has tried nothing for the symptoms.  Rash   This is a new problem. The current episode started more than 1 week ago. The problem has been gradually improving. The problem is associated with nothing. There has been no fever. The rash is present on the left ankle. The pain is mild. The pain has been constant since onset. Associated symptoms include pain. She has tried anti-itch cream for the symptoms.  Wrist Pain  This is a new problem. The current episode started 1 to 2 hours ago. The problem occurs constantly. The problem has not changed since onset.Associated symptoms include chest pain and headaches. Pertinent negatives include no abdominal pain and no shortness of breath. The symptoms are aggravated by twisting. Nothing relieves the symptoms. She has tried nothing for the symptoms.      Past Medical History:  Diagnosis Date  . Anxiety   . Arthritis   . Breast cancer ([place])    Rt mastectomy  . CAD (coronary artery disease)   . Cerebral atherosclerosis    CAROTID DOPPLER, 06/18/2009 - RIGHT/LEFT BULB AND PROXIMAL ICAs-0-49% diameter reduction  . DVT (deep venous thrombosis) (HCC)    Recurrent, 1st episode after CABG 1997, 2nd episode after hip surgery 2001, placed on life-long coumadin ever since; LEXISCAN, 03/13/2008 - normal, no ECG changes, EKG negative for ischemia  . GERD (gastroesophageal reflux disease)   . Guaiac positive stools   . Hypercholesteremia   .  Hyperlipidemia   . Hypertension   . Hypothyroidism   . Osteoporosis   . Ovarian cyst   . Pancreatitis   . 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 Severe aortic stenosis    2D ECHO, 04/22/2012 - EF 60-65%, aortic valve-heavily calcified with restricted leaflet motion, left atrium severly dilated  .  Swelling of right lower extremity    LEA VENOUS DUPLEX SCAN, 08/18/2011 - no evidence of acute thrombus or thrombophlebitis  . Type II diabetes mellitus (HCC)    Diet controlled  . Vitamin D deficiency     Patient Active Problem List   Diagnosis Date Noted  . Aortic valve disorders 05/27/2012  . Type II diabetes mellitus ([place]) 05/27/2012  . Arthritis 05/27/2012  . Hypertension 05/27/2012  . Hypercholesteremia 05/27/2012  . DVT (deep venous thrombosis) ([place])   . Severe aortic stenosis 05/24/2012  . CAD (coronary artery disease) 05/24/2012  . S/P CABG x 5 10/25/1995    Past Surgical History:  Procedure Laterality Date  . AORTIC VALVE REPLACEMENT    . CARDIOLITE MYOCARDIAL PERFUSION STUDY  06/13/2004   Normal static and dynamic myocardial perfusion images with EF of 73%  . CORONARY ARTERY BYPASS GRAFT  10/25/1995   x5 , Dr Prescott Gum  . HIP ARTHROPLASTY     right  . LAPAROTOMY    . LEFT AND RIGHT HEART CATHETERIZATION WITH CORONARY/GRAFT ANGIOGRAM N/A 05/23/2012   Procedure: LEFT AND RIGHT HEART CATHETERIZATION WITH Beatrix Fetters;  Surgeon: Sanda Klein, MD;  Location: [facility] CATH LAB;  Service: Cardiovascular;  Laterality: N/A;  . right mastectomy  1979    OB History    No data available       Home Medications    Prior to Admission medications   Medication Sig Start Date End Date Taking? Authorizing Provider  alendronate (FOSAMAX) 70 MG tablet Take 70 mg by mouth every 7 (seven) days. Take with a full glass of water on an empty stomach. Takes on Saturdays    Historical Provider, MD  ascorbic acid (VITAMIN C) 1000 MG tablet Take 1,000 mg by mouth every morning.    Historical Provider, MD  aspirin EC 81 MG tablet Take 81 mg by mouth every morning.    Historical Provider, MD  atorvastatin (LIPITOR) 20 MG tablet Take 1 tablet (20 mg total) by mouth at bedtime. NEEDS APPOINTMENT FOR FUTURE REFILLS 08/13/14   Sanda Klein, MD  cholecalciferol (VITAMIN D) 1000 UNITS tablet  Take 2,000 Units by mouth every morning.    Historical Provider, MD  esomeprazole (NEXIUM) 40 MG capsule Take 40 mg by mouth daily before breakfast.    Historical Provider, MD  levothyroxine (SYNTHROID, LEVOTHROID) 50 MCG tablet Take 50 mcg by mouth daily before breakfast.    Historical Provider, MD  lisinopril (PRINIVIL,ZESTRIL) 10 MG tablet Take 10 mg by mouth every morning.    Historical Provider, MD  loratadine (CLARITIN) 10 MG tablet Take 10 mg by mouth daily as needed (seasonal allergies).     Historical Provider, MD  LORazepam (ATIVAN) 0.5 MG tablet Take 0.25-0.5 mg by mouth 2 (two) times daily. For anxiety    Historical Provider, MD  Multiple Vitamin (MULTIVITAMIN WITH MINERALS) TABS tablet Take 1 tablet by mouth every morning.    Historical Provider, MD  warfarin (COUMADIN) 5 MG tablet Take 5 mg  by mouth daily. Or as directed    Historical Provider, MD    Family History Family History  Problem Relation Age of Onset  . Pancreatitis Mother   . Heart disease Father     family HX  . Heart disease Sister   . Heart disease Brother   . Heart disease Brother 68  . Heart disease Sister 60    Social History Social History  Substance Use Topics  . Smoking status: Former Smoker    Packs/day: 0.50    Years: 19.00    Quit date: 05/24/1969  . Smokeless tobacco: Never Used  . Alcohol use No     Allergies   Contrast media  [iodinated diagnostic agents]; Other; and Latex   Review of Systems Review of Systems  Constitutional: Negative for activity change, appetite change, chills, diaphoresis, fatigue and fever.  HENT: Negative for congestion and rhinorrhea.   Eyes: Negative for visual disturbance.  Respiratory: Negative for cough, chest tightness, shortness of breath, wheezing and stridor.   Cardiovascular: Positive for chest pain. Negative for palpitations and leg swelling.  Gastrointestinal: Negative for abdominal distention, abdominal pain, constipation, diarrhea, nausea and  vomiting.  Genitourinary: Negative for difficulty urinating, dysuria, flank pain, frequency, hematuria, menstrual problem, pelvic pain, vaginal bleeding and vaginal discharge.  Musculoskeletal: Negative for back pain, neck pain and neck stiffness.  Skin: Positive for rash. Negative for wound.  Neurological: Positive for headaches. Negative for dizziness, facial asymmetry, weakness, light-headedness and numbness.  Psychiatric/Behavioral: Negative for agitation and confusion.  All other systems reviewed and are negative.    Physical Exam Updated Vital Signs BP 127/81 (BP Location: Left Arm)   Pulse 85   Temp 98.2 F (36.8 C) (Oral)   Resp 18   SpO2 94%   Physical Exam  Constitutional: She is oriented to person, place, and time. She appears well-developed and well-nourished. No distress.  HENT:  Head: Head is with abrasion and with contusion.    Right Ear: External ear normal.  Nose: Nose normal.  Mouth/Throat: Oropharynx is clear and moist. No oropharyngeal exudate.  Eyes: Conjunctivae and EOM are normal. Pupils are equal, round, and reactive to light.  Neck: Normal range of motion. Neck supple.  Cardiovascular: Normal rate and regular rhythm.   No murmur heard. Pulmonary/Chest: Effort normal and breath sounds normal. No stridor. No respiratory distress. She has no wheezes. She has no rales. She exhibits tenderness.  Abdominal: Soft. There is no tenderness.  Musculoskeletal: She exhibits tenderness. She exhibits no edema or deformity.       Left wrist: She exhibits decreased range of motion (2/2 pain), tenderness, bony tenderness, swelling and crepitus. She exhibits no laceration.       Left ankle: She exhibits no laceration.       Feet:  No fluctuance, drainage, crepitance, or induration. Surrounding erythema present. Bilateral LE N/V intact.   L wrist has tenderness. Normal senstaion, cap refill, strength limited due to pain, And normal pulses.   Neurological: She is alert  and oriented to person, place, and time. She displays normal reflexes. No cranial nerve deficit or sensory deficit. She exhibits normal muscle tone. Coordination normal.  Skin: Skin is warm and dry. Capillary refill takes less than 2 seconds. No rash noted. There is erythema.  Psychiatric: She has a normal mood and affect.  Nursing note and vitals reviewed.    ED Treatments / Results  Labs (all labs ordered are listed, but only abnormal results are displayed) Labs Reviewed  CBC  WITH DIFFERENTIAL/PLATELET - Abnormal; Notable for the following:       Result Value   RBC 5.17 (*)    MCV 77.0 (*)    MCH 25.3 (*)    All other components within normal limits  COMPREHENSIVE METABOLIC PANEL - Abnormal; Notable for the following:    Glucose, Bld 111 (*)    BUN 28 (*)    GFR calc non Af Amer 53 (*)    All other components within normal limits  URINALYSIS, ROUTINE W REFLEX MICROSCOPIC (NOT AT [facility]) - Abnormal; Notable for the following:    Hgb urine dipstick SMALL (*)    Leukocytes, UA SMALL (*)    All other components within normal limits  PROTIME-INR - Abnormal; Notable for the following:    Prothrombin Time 24.7 (*)    All other components within normal limits  URINE MICROSCOPIC-ADD ON - Abnormal; Notable for the following:    Squamous Epithelial / LPF 0-5 (*)    Bacteria, UA FEW (*)    All other components within normal limits  LIPASE, BLOOD  I-STAT CG4 LACTIC ACID, ED  I-STAT CG4 LACTIC ACID, ED    EKG  EKG Interpretation None       Radiology Dg Ribs Unilateral W/chest Right  Result Date: 04/07/2016 CLINICAL DATA:  Fall.  Right-sided pain EXAM: RIGHT RIBS AND CHEST - 3+ VIEW COMPARISON:  08/26/2013 FINDINGS: Heart size within normal limits. TAVR. CABG. Negative for heart failure. Lungs are clear. Mildly depressed fractures of right lateral ribs. The sixth and seventh ribs appear fractured. No effusion or pneumothorax IMPRESSION: Fractures of the right sixth and seventh ribs.   Lungs are clear. Electronically Signed   By: Franchot Gallo M.D.   On: 04/07/2016 18:57   Dg Wrist Complete Left  Result Date: 04/07/2016 CLINICAL DATA:  Fall EXAM: LEFT WRIST - COMPLETE 3+ VIEW COMPARISON:  None. FINDINGS: Transverse fracture distal radius with impaction and no significant displacement. Nondisplaced transverse fracture distal ulna. Radiocarpal joint normal. No fracture the carpal bones. Moderate degenerative change of the base of thumb. Diffuse arterial calcification IMPRESSION: Nondisplaced transverse fractures of the distal radius and ulna. Electronically Signed   By: Franchot Gallo M.D.   On: 04/07/2016 18:51   Dg Tibia/fibula Left  Result Date: 04/07/2016 CLINICAL DATA:  Medial ankle skin wound for 2 months, treated at wound center. EXAM: LEFT TIBIA AND FIBULA - 2 VIEW COMPARISON:  None. FINDINGS: Focal gas over medial malleolus compatible with ulcer. There is no evidence of fracture or other focal bone lesions. Osteopenia. Moderate vascular calcifications. Vascular clips within the medial knee. Moderate plantar calcaneal spur. IMPRESSION: Medial malleolus skin ulcer, no radiographic findings of osteomyelitis. Electronically Signed   By: Elon Alas M.D.   On: 04/07/2016 20:53   Ct Head Wo Contrast  Result Date: 04/07/2016 CLINICAL DATA:  Patient lost balance and fell. EXAM: CT HEAD WITHOUT CONTRAST TECHNIQUE: Contiguous axial images were obtained from the base of the skull through the vertex without intravenous contrast. COMPARISON:  None FINDINGS: BRAIN: There is sulcal and ventricular prominence consistent with superficial and central atrophy. No intraparenchymal hemorrhage, mass effect nor midline shift. There are patchy periventricular and subcortical white matter hypodensities consistent with chronic small vessel ischemic disease. No acute large vascular territory infarcts. No abnormal extra-axial fluid collections. Basal cisterns are patent. Anterior falcine  calcifications. VASCULAR: Moderate calcific atherosclerosis of the carotid siphons and both vertebral arteries. SKULL: No skull fracture. No significant scalp soft tissue swelling. SINUSES/ORBITS: 8  mm mucous retention cyst noted in the sphenoid sinus. No acute sinusitis. Mastoids are clear. Bilateral ocular surgeries. OTHER: None. IMPRESSION: Superficial and central atrophy with chronic appearing small vessel ischemic disease of periventricular white matter. No acute intracranial abnormality. Electronically Signed   By: Ashley Royalty M.D.   On: 04/07/2016 21:30   Ct Chest Wo Contrast  Result Date: 04/07/2016 CLINICAL DATA:  Right-sided rib pain after fall. EXAM: CT CHEST WITHOUT CONTRAST TECHNIQUE: Multidetector CT imaging of the chest was performed following the standard protocol without IV contrast. COMPARISON:  Same day chest and right rib radiographs. Lumbar spine MRI from 07/23/2014. FINDINGS: Cardiovascular: The patient is status post CABG with aortic valve replacement. Median sternotomy sutures are noted. The heart is normal in size. No pericardial effusion. Mediastinum/Nodes: No supraclavicular, axillary, mediastinal nor hilar adenopathy. Small fat containing right lower paratracheal lymph node is seen measuring 9 mm short axis. Large sliding hiatal hernia. Right axillary clips are present. Lungs/Pleura: No pneumothorax or hemothorax. Biapical pleural parenchymal scarring. Left upper lobe 8 by 4 mm density, series 4, image 36 may represent some mucous impaction. Left upper lobe calcified granuloma. There is bilateral multilobar bronchiectasis. Upper Abdomen: 2.5 cm upper pole renal cyst on the left. Normal right adrenal gland. Mild thickening of the left adrenal gland. Musculoskeletal: Chronic L1 compression fracture. The right-sided sixth and seventh rib fractures better visualized on the plain radiographs acquired earlier on the same day are surprisingly not apparent on CT. IMPRESSION: The right sixth  and seventh rib fractures are not CT apparent. No pneumothorax or hemothorax. Chronic marked L1 osteoporotic compression fracture. No acute cardiopulmonary disease. Bilateral bronchiectasis with probable small focus of mucous impaction in the left upper lobe. Large hiatal hernia. Status post CABG and AVR. Electronically Signed   By: Ashley Royalty M.D.   On: 04/07/2016 21:54   Dg Hand Complete Left  Result Date: 04/07/2016 CLINICAL DATA:  Fall EXAM: LEFT HAND - COMPLETE 3+ VIEW COMPARISON:  Left wrist today FINDINGS: Nondisplaced transverse impacted fracture of the distal radius. Nondisplaced fracture of the distal ulna. Radial ulnar joint intact. Moderate degenerative change at the base of thumb. Degenerative change in the DIP and PIP joints of the second through fourth fingers. No fracture the hand. IMPRESSION: Nondisplaced fractures distal radius and ulna. Electronically Signed   By: Franchot Gallo M.D.   On: 04/07/2016 18:50    Procedures Procedures (including critical care time)  Medications Ordered in ED Medications  sodium chloride 0.9 % bolus 1,000 mL (0 mLs Intravenous Stopped 04/08/16 0034)  fentaNYL (SUBLIMAZE) injection 25 mcg (25 mcg Intravenous Given 04/07/16 2138)  traMADol (ULTRAM) tablet 50 mg (50 mg Oral Given 04/07/16 2336)  cephALEXin (KEFLEX) capsule 500 mg (500 mg Oral Given 04/07/16 2336)  bacitracin 500 UNIT/GM ointment (  Given 04/08/16 0033)     Initial Impression / Assessment and Plan / ED Course  I have reviewed the triage vital signs and the nursing notes.  Pertinent labs & imaging results that were available during my care of the patient were reviewed by me and considered in my medical decision making (see chart for details).  Clinical Course    Becky Mcmahon is a 82 y.o. female With a past medical history significant for coronary artery disease status post CABG, aortic valve placement, DVT on Coumadin, breast cancer, diabetes, thyroid disease,  Hypertension, And hyperlipidemia who presents for a fall.  History and exam are seen above.  Given patient having a head injury on  Coumadin, CT of the had ordered. No evidence of acute leading more fracture was seen. Chest x-ray showed concern for two rib fractures. Given concern for rib fractures in patient and her 60s, CT of the chest order to further evaluate to look for pulmonary contusion or other intrathoracic injury. No pulmonary contusion was seen on CT scan and fractures were actually not visualized on CT imaging. Patient's wrist showed evidence of distal radius and ulna fracture. Patient had normal pulse, capillary refill, and sensation in the wrist. Patient had wrist and hand strength decreased secondary to pain but she was able to move all fingers. As the fracture appears closed, patient placed into a sugar tong splint.  Patient had laboratory testing which showed No significant abnormalities or evidence of infection. Patient had no urinary symptoms, do not feel patient has UTI. Suspect mechanical Fall as cause of injury.  Patient given fentanyl with improvement in pain. Patient did not want to take hydrocodone or morphine. Patient given dose of ultram without complication in the ED.  Do not feel patient has systemic infection however, due to erythema around the wound and worsened pain, patient will be started on oral Keflex. Patient given a dose without complication.  Due to wrist fracture, patient placed in to split and will follow up with orthopedics. Patient given instructions on split care and return precautions for compartment syndrome. Patient given instructions to follow up with PCP for rib fractures and cellulitis. Patient will follow up with wound care as previously planned.  Patient understood need for pulmonary toilet and breathing apparatus was ordered to encourage deep breathing. Patient also stressed on the need for preventing pneumonia with rib fractures. Patient understood  strict return precautions and follow instructions. Patient had no questions or concerns and was discharged in good condition.    Final Clinical Impressions(s) / ED Diagnoses   Final diagnoses:  Closed fracture of multiple ribs of right side, initial encounter  Closed fracture of left radius and ulna, initial encounter  Cellulitis of left lower extremity    New Prescriptions Discharge Medication List as of 04/08/2016 12:05 AM    START taking these medications   Details  cephALEXin (KEFLEX) 500 MG capsule Take 1 capsule (500 mg total) by mouth 4 (four) times daily., Starting Wed 04/08/2016, Until Wed 04/22/2016, Print    traMADol (ULTRAM) 50 MG tablet Take 1 tablet (50 mg total) by mouth every 6 (six) hours as needed., Starting Wed 04/08/2016, Print        Clinical Impression: 1. Closed fracture of multiple ribs of right side, initial encounter   2. Closed fracture of left radius and ulna, initial encounter   3. Cellulitis of left lower extremity     Disposition: Discharge  Condition: Good  I have discussed the results, Dx and Tx plan with the pt(& family if present). He/she/they expressed understanding and agree(s) with the plan. Discharge instructions discussed at great length. Strict return precautions discussed and pt &/or family have verbalized understanding of the instructions. No further questions at time of discharge.    Discharge Medication List as of 04/08/2016 12:05 AM    START taking these medications   Details  cephALEXin (KEFLEX) 500 MG capsule Take 1 capsule (500 mg total) by mouth 4 (four) times daily., Starting Wed 04/08/2016, Until Wed 04/22/2016, Print    traMADol (ULTRAM) 50 MG tablet Take 1 tablet (50 mg total) by mouth every 6 (six) hours as needed., Starting Wed 04/08/2016, Print  Follow Up: Prince Solian, MD [address] [id number] [phone]  Schedule an appointment as soon as possible for a visit    [facility] [id number] [place] [phone]  If symptoms worsen  Renette Butters, MD [address] [place] [address] [phone]  Schedule an appointment as soon as possible for a visit in 1 week please schedule appointment with orthopedics team in 1 week for further management of wrist fracture.     Gwenyth Allegra Tegeler, MD 04/08/16 [phone]

## 2016-04-07 NOTE — ED Triage Notes (Signed)
Patient fell at home when she fell at home today. Patient thiks lost her balance. Patient tried catching herself and has knot and pain in left wrist.  Patient also c/o right rib cage pain.  Patient denies LOC or hitting her head. Patient is on coumadin.

## 2016-04-07 NOTE — ED Notes (Signed)
Patient transported to X-ray 

## 2016-04-07 NOTE — ED Notes (Signed)
ED Provider at bedside. 

## 2016-04-07 NOTE — ED Notes (Signed)
[facility] contacted r/t Sugar-Tong splint left wrist.

## 2016-04-08 LAB — URINALYSIS, ROUTINE W REFLEX MICROSCOPIC
Bilirubin Urine: NEGATIVE
GLUCOSE, UA: NEGATIVE mg/dL
KETONES UR: NEGATIVE mg/dL
Nitrite: NEGATIVE
PH: 5 (ref 5.0–8.0)
PROTEIN: NEGATIVE mg/dL
Specific Gravity, Urine: 1.022 (ref 1.005–1.030)

## 2016-04-08 LAB — URINE MICROSCOPIC-ADD ON

## 2016-04-08 MED ORDER — TRAMADOL HCL 50 MG PO TABS: 50.0000 mg | ORAL_TABLET | Freq: Four times a day (QID) | ORAL | 0 refills | Status: DC | PRN

## 2016-04-08 MED ORDER — CEPHALEXIN 500 MG PO CAPS: 500.0000 mg | ORAL_CAPSULE | Freq: Four times a day (QID) | ORAL | 0 refills | Status: AC

## 2016-04-08 NOTE — Discharge Instructions (Signed)
Please continue taking deep breaths and take her pain medicine to help with her rib fractures. Please feel wrist splint dry and follow-up with the orthopedics team. If any problems arise with your wrist, please return to the nearest ED. Please follow-up with your PCP for further management of your foot cellulitis and your rib fractures. If any symptoms return or worsen including  uncontrollable chest pain or shortness of breath, please return to the nearest ED.

## 2016-04-15 ENCOUNTER — Encounter: Payer: Self-pay | Admitting: Vascular Surgery

## 2016-04-15 ENCOUNTER — Encounter (HOSPITAL_BASED_OUTPATIENT_CLINIC_OR_DEPARTMENT_OTHER): Payer: Medicare Other | Attending: Surgery

## 2016-04-15 DIAGNOSIS — Z952 Presence of prosthetic heart valve: Secondary | ICD-10-CM | POA: Diagnosis not present

## 2016-04-15 DIAGNOSIS — I739 Peripheral vascular disease, unspecified: Secondary | ICD-10-CM | POA: Insufficient documentation

## 2016-04-15 DIAGNOSIS — I890 Lymphedema, not elsewhere classified: Secondary | ICD-10-CM | POA: Diagnosis not present

## 2016-04-15 DIAGNOSIS — L97322 Non-pressure chronic ulcer of left ankle with fat layer exposed: Secondary | ICD-10-CM | POA: Insufficient documentation

## 2016-04-15 DIAGNOSIS — Z951 Presence of aortocoronary bypass graft: Secondary | ICD-10-CM | POA: Diagnosis not present

## 2016-04-15 DIAGNOSIS — I83023 Varicose veins of left lower extremity with ulcer of ankle: Secondary | ICD-10-CM | POA: Insufficient documentation

## 2016-04-15 DIAGNOSIS — I10 Essential (primary) hypertension: Secondary | ICD-10-CM | POA: Diagnosis not present

## 2016-04-15 DIAGNOSIS — I2510 Atherosclerotic heart disease of native coronary artery without angina pectoris: Secondary | ICD-10-CM | POA: Diagnosis not present

## 2016-04-21 DIAGNOSIS — I83023 Varicose veins of left lower extremity with ulcer of ankle: Secondary | ICD-10-CM | POA: Diagnosis not present

## 2016-04-23 ENCOUNTER — Encounter: Payer: Self-pay | Admitting: Vascular Surgery

## 2016-04-23 ENCOUNTER — Ambulatory Visit (INDEPENDENT_AMBULATORY_CARE_PROVIDER_SITE_OTHER): Payer: Medicare Other | Admitting: Vascular Surgery

## 2016-04-23 VITALS — BP 136/76 | HR 98 | Temp 97.2°F | Resp 18 | Ht 66.0 in | Wt 160.0 lb

## 2016-04-23 DIAGNOSIS — E11622 Type 2 diabetes mellitus with other skin ulcer: Secondary | ICD-10-CM

## 2016-04-23 DIAGNOSIS — L97309 Non-pressure chronic ulcer of unspecified ankle with unspecified severity: Secondary | ICD-10-CM | POA: Diagnosis not present

## 2016-04-23 NOTE — Progress Notes (Signed)
Referred by:  Prince Solian, MD [address] [place], [place] [address]  Reason for referral: left ankle ulcer  History of Present Illness  Becky Mcmahon is a 82 y.o. (30-Jun-1930) female w/ DM and recent steroid use who presents with chief complaint: non-healing ulcer L medial malleolus.  3 months ago she was biten by a mosquito in left ankle, this medial malleolus wound progressed and develop tissue loss.  Patient has been under Wound Care's management since then.  Pt recently was placed on abx for presumed cellulitis.  She has completed her abx course.  Redness around the ulcer reportedly is better.  Additionally, the patient has significant swelling prior the recent improvement.  She was seen in the ED recently on 0000000 for complications of a fall resulting in L wrist fx.  She was also given Keflex at that time.  Her atherosclerotic risk factors include: HLD, HTN, DM, and former smoker.  Pt notes prior CABG with vein harvest in left leg: previous incision was at the ulcer site.  Past Medical History:  Diagnosis Date  . Anxiety   . Arthritis   . Breast cancer ([place])    Rt mastectomy  . CAD (coronary artery disease)   . Cerebral atherosclerosis    CAROTID DOPPLER, 06/18/2009 - RIGHT/LEFT BULB AND PROXIMAL ICAs-0-49% diameter reduction  . DVT (deep venous thrombosis) (HCC)    Recurrent, 1st episode after CABG 1997, 2nd episode after hip surgery 2001, placed on life-long coumadin ever since; LEXISCAN, 03/13/2008 - normal, no ECG changes, EKG negative for ischemia  . GERD (gastroesophageal reflux disease)   . Guaiac positive stools   . Hypercholesteremia   . Hyperlipidemia   . Hypertension   . Hypothyroidism   . Osteoporosis   . Ovarian cyst   . Pancreatitis   . 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 Severe aortic stenosis    2D ECHO, 04/22/2012 - EF  60-65%, aortic valve-heavily calcified with restricted leaflet motion, left atrium severly dilated  . Swelling of right lower extremity    LEA VENOUS DUPLEX SCAN, 08/18/2011 - no evidence of acute thrombus or thrombophlebitis  . Type II diabetes mellitus (HCC)    Diet controlled  . Vitamin D deficiency     Past Surgical History:  Procedure Laterality Date  . AORTIC VALVE REPLACEMENT    . CARDIOLITE MYOCARDIAL PERFUSION STUDY  06/13/2004   Normal static and dynamic myocardial perfusion images with EF of 73%  . CORONARY ARTERY BYPASS GRAFT  10/25/1995   x5 , Dr Prescott Gum  . HIP ARTHROPLASTY     right  . LAPAROTOMY    . LEFT AND RIGHT HEART CATHETERIZATION WITH CORONARY/GRAFT ANGIOGRAM N/A 05/23/2012   Procedure: LEFT AND RIGHT HEART CATHETERIZATION WITH Beatrix Fetters;  Surgeon: Sanda Klein, MD;  Location: [facility];  Service: Cardiovascular;  Laterality: N/A;  . right mastectomy  1979    Social History   Social History  . Marital status: Married    Spouse name: N/A  . Number of children: N/A  . Years of education: N/A   Occupational History  . retired grade school teacher [place] History Main Topics  . Smoking status: Former Smoker    Packs/day: 0.50    Years: 19.00    Quit date: 05/24/1969  .  Smokeless tobacco: Never Used  . Alcohol use No  . Drug use: No  . Sexual activity: Not on file   Other Topics Concern  . Not on file   Social History Narrative   Lives with husband and 1 daughter, 2nd daughter lives in [place] physically active and functionally independent   Active in church    Family History  Problem Relation Age of Onset  . Pancreatitis Mother   . Heart disease Father     family HX  . Heart disease Sister   . Heart disease Brother   . Heart disease Brother 35  . Heart disease Sister 46    Current Outpatient Prescriptions  Medication Sig Dispense Refill  . alendronate (FOSAMAX) 70 MG tablet Take 70 mg  by mouth every 7 (seven) days. Take with a full glass of water on an empty stomach. Takes on Saturdays    . ascorbic acid (VITAMIN C) 1000 MG tablet Take 1,000 mg by mouth every morning.    Marland Kitchen aspirin EC 81 MG tablet Take 81 mg by mouth every evening.     Marland Kitchen atorvastatin (LIPITOR) 20 MG tablet Take 1 tablet (20 mg total) by mouth at bedtime. NEEDS APPOINTMENT FOR FUTURE REFILLS 15 tablet 0  . cholecalciferol (VITAMIN D) 1000 UNITS tablet Take 2,000 Units by mouth every evening.     Marland Kitchen esomeprazole (NEXIUM) 40 MG capsule Take 40 mg by mouth daily before breakfast.    . HYDROcodone-acetaminophen (NORCO/VICODIN) 5-325 MG tablet Take 1 tablet by mouth every 6 (six) hours as needed for moderate pain.    Marland Kitchen levothyroxine (SYNTHROID, LEVOTHROID) 50 MCG tablet Take 50 mcg by mouth daily before breakfast.    . lisinopril (PRINIVIL,ZESTRIL) 10 MG tablet Take 10 mg by mouth every morning.    . loratadine (CLARITIN) 10 MG tablet Take 10 mg by mouth daily.     Marland Kitchen LORazepam (ATIVAN) 0.5 MG tablet Take 0.25 mg by mouth 2 (two) times daily. For anxiety     . Multiple Vitamin (MULTIVITAMIN WITH MINERALS) TABS tablet Take 1 tablet by mouth every morning.    . traMADol (ULTRAM) 50 MG tablet Take 1 tablet (50 mg total) by mouth every 6 (six) hours as needed. 20 tablet 0  . warfarin (COUMADIN) 5 MG tablet Take 5 mg by mouth daily. Or as directed    . SANTYL ointment APPLY OINTMENT AA TOPICALLY UTD.  0   No current facility-administered medications for this visit.     Allergies  Allergen Reactions  . Contrast Media  [Iodinated Diagnostic Agents] Itching  . Other     Band Aids  . Keflex [Cephalexin] Rash    Severe itching  . Latex Rash     REVIEW OF SYSTEMS:   Cardiac:  positive for: prior TAVR, negative for: Chest pain or chest pressure, Shortness of breath upon exertion and Shortness of breath when lying flat,   Vascular:  positive for: pain leg at rest and walking, leg swelling, DVT negative for:  none  Pulmonary:  positive for: no symptoms,  negative for: Oxygen at home, Productive cough and Wheezing  Neurologic:  positive for: No symptoms, negative for: Sudden weakness in arms or legs, Sudden numbness in arms or legs, Sudden onset of difficulty speaking or slurred speech, Temporary loss of vision in one eye and Problems with dizziness  Gastrointestinal:  positive for: no symptoms, negative for: Blood in stool and Vomited blood  Genitourinary:  positive for: no symptoms, negative for:  Burning when urinating and Blood in urine  Psychiatric:  positive for: no symptoms,  negative for: Major depression  Hematologic:  positive for: no symptoms,  negative for: negative for: Bleeding problems and Problems with blood clotting too easily  Dermatologic:  positive for: no symptoms, negative for: Rashes or ulcers  Constitutional:  positive for: no symptoms, negative for: Fever or chills  Ear/Nose/Throat:  positive for: no symptoms, negative for: Change in hearing, Nose bleeds and Sore throat  Musculoskeletal:  positive for: no symptoms, negative for: Back pain, Joint pain and Muscle pain   For VQI Use Only  PRE-ADM LIVING: Home  AMB STATUS: Wheelchair  CAD Sx: None  PRIOR CHF: None  STRESS TEST: No   Physical Examination  Vitals:   04/23/16 1528  BP: 136/76  Pulse: 98  Resp: 18  Temp: 97.2 F (36.2 C)  TempSrc: Oral  SpO2: 98%  Weight: 160 lb (72.6 kg)  Height: 5\' 6"  (1.676 m)    Body mass index is 25.82 kg/m.  General: Alert, O x 3, WD,Ill appearing  Head: [place]/AT,   Ear/Nose/Throat: Hearing grossly decreased, nares without erythema or drainage, oropharynx without Erythema or Exudate , Mallampati score: 3,   Eyes: PERRL, EOMI,   Neck: Supple, mid-line trachea,    Pulmonary: Sym exp, good B air movt,CTA B  Cardiac: RRR, Nl S1, S2, no Murmurs, No rubs, No S3,S4  Vascular: Vessel Right Left  Radial Palpable Palpable  Brachial Palpable  Palpable  Carotid Palpable, No Bruit Palpable, No Bruit  Aorta Not palpable N/A  Femoral Palpable Palpable  Popliteal Not palpable Not palpable  PT Palpable Palpable  DP Palpable Palpable   Gastrointestinal: soft, non-distended, non-tender to palpation, No guarding or rebound, no HSM, no masses, no CVAT B, No palpable prominent aortic pulse,    Musculoskeletal: M/S 5/5 throughout BUE, BLE not tested but pt raised both legs spontaenously, Extremities without ischemic changes except L medial malleolus ulcer with clean fat evident, erythema surrounds ulcer and extension onto distal calf, mild TTP in periulcer skin, BLE edema 1+, Spider veins bilaterally, LDS present: B leg, incision over distal thigh  Neurologic: CN 2-12 intact , Pain and light touch intact in extremities , Motor exam as listed above  Psychiatric: Judgement intact, Mood & affect appropriate for pt's clinical situation  Dermatologic: See M/S exam for extremity exam, No rashes otherwise noted  Lymph : Palpable lymph nodes: None   Non-Invasive Vascular Imaging  ABI (Date: 03/27/16)  R:   ABI: [place],   DP: bi  PT: tri  TBI: 0.59  L:   ABI: [place],   DP: tri  PT: tri  TBI: 0.68  LLE Venous Insufficiency (03/27/16)  No residual thrombus  SFJ and CFV reflux   GSV reflux in proximal thigh  No GSV visualized from distal thigh down   Outside Studies/Documentation 6 pages of outside documents were reviewed including: ED note and recent vascular studies   Medical Decision Making  Becky Mcmahon is a 82 y.o. female who presents with: DM, reportedly recent steroid use, normal perfusion of both feet, prior LLE GSV harvest, LLE CVI (C4)   Pt does not need arterial optimization given the excellent flow into the feet despite the non-compressible tibial arteries.  The patient's does not appear to have deep venous insufficiency in the left leg.  The distal GSV has already been removed as part of his CABG,  so there is no further optimization of the patient chronic venous insufficiency other  compression.  I don't think ablating the thigh segment of GSV is going to improve the healing of the L medial malleolus ulcer.  I suspect optimizing her medical issues including: optimization of her glucose control, Vit A to help with reversal of poor healing due to recent steroid use, Vitamin C, Zinc, MVI, compression therapy, and considering hyperbaric oxygen might help with wound healing.    Additional abx might be necessary given the current findings on exam.  I discussed in depth with the patient the nature of atherosclerosis, and emphasized the importance of maximal medical management including strict control of blood pressure, blood glucose, and lipid levels, antiplatelet agents, obtaining regular exercise, and cessation of smoking.  The patient is aware that without maximal medical management the underlying atherosclerotic disease process will progress, limiting the benefit of any interventions. The patient is currently on a statin:  Lipitor. The patient is currently on an anti-platelet: ASA.  Thank you for allowing [place] to participate in this patient's care.   Adele Barthel, MD [facility] Office: [phone] Pager: [phone]  04/23/2016, 4:04 PM

## 2016-04-28 DIAGNOSIS — I83023 Varicose veins of left lower extremity with ulcer of ankle: Secondary | ICD-10-CM | POA: Diagnosis not present

## 2016-05-05 DIAGNOSIS — I83023 Varicose veins of left lower extremity with ulcer of ankle: Secondary | ICD-10-CM | POA: Diagnosis not present

## 2016-05-12 ENCOUNTER — Encounter (HOSPITAL_BASED_OUTPATIENT_CLINIC_OR_DEPARTMENT_OTHER): Payer: Medicare Other | Attending: Surgery

## 2016-05-12 DIAGNOSIS — Z952 Presence of prosthetic heart valve: Secondary | ICD-10-CM | POA: Diagnosis not present

## 2016-05-12 DIAGNOSIS — I83023 Varicose veins of left lower extremity with ulcer of ankle: Secondary | ICD-10-CM | POA: Diagnosis present

## 2016-05-12 DIAGNOSIS — L97322 Non-pressure chronic ulcer of left ankle with fat layer exposed: Secondary | ICD-10-CM | POA: Diagnosis not present

## 2016-05-12 DIAGNOSIS — I10 Essential (primary) hypertension: Secondary | ICD-10-CM | POA: Insufficient documentation

## 2016-05-12 DIAGNOSIS — Z87891 Personal history of nicotine dependence: Secondary | ICD-10-CM | POA: Diagnosis not present

## 2016-05-12 DIAGNOSIS — Z86718 Personal history of other venous thrombosis and embolism: Secondary | ICD-10-CM | POA: Insufficient documentation

## 2016-05-12 DIAGNOSIS — I2510 Atherosclerotic heart disease of native coronary artery without angina pectoris: Secondary | ICD-10-CM | POA: Diagnosis not present

## 2016-05-12 DIAGNOSIS — Z951 Presence of aortocoronary bypass graft: Secondary | ICD-10-CM | POA: Insufficient documentation

## 2016-05-19 DIAGNOSIS — I83023 Varicose veins of left lower extremity with ulcer of ankle: Secondary | ICD-10-CM | POA: Diagnosis not present

## 2016-05-26 DIAGNOSIS — I83023 Varicose veins of left lower extremity with ulcer of ankle: Secondary | ICD-10-CM | POA: Diagnosis not present

## 2016-06-02 DIAGNOSIS — I83023 Varicose veins of left lower extremity with ulcer of ankle: Secondary | ICD-10-CM | POA: Diagnosis not present

## 2016-06-09 DIAGNOSIS — I83023 Varicose veins of left lower extremity with ulcer of ankle: Secondary | ICD-10-CM | POA: Diagnosis not present

## 2016-06-16 ENCOUNTER — Encounter (HOSPITAL_BASED_OUTPATIENT_CLINIC_OR_DEPARTMENT_OTHER): Payer: Medicare Other | Attending: Surgery

## 2016-06-16 DIAGNOSIS — L97322 Non-pressure chronic ulcer of left ankle with fat layer exposed: Secondary | ICD-10-CM | POA: Insufficient documentation

## 2016-06-16 DIAGNOSIS — I83023 Varicose veins of left lower extremity with ulcer of ankle: Secondary | ICD-10-CM | POA: Diagnosis present

## 2016-06-16 DIAGNOSIS — Z86718 Personal history of other venous thrombosis and embolism: Secondary | ICD-10-CM | POA: Diagnosis not present

## 2016-06-16 DIAGNOSIS — I739 Peripheral vascular disease, unspecified: Secondary | ICD-10-CM | POA: Diagnosis not present

## 2016-06-16 DIAGNOSIS — I2510 Atherosclerotic heart disease of native coronary artery without angina pectoris: Secondary | ICD-10-CM | POA: Diagnosis not present

## 2016-06-16 DIAGNOSIS — I10 Essential (primary) hypertension: Secondary | ICD-10-CM | POA: Insufficient documentation

## 2016-06-23 DIAGNOSIS — I83023 Varicose veins of left lower extremity with ulcer of ankle: Secondary | ICD-10-CM | POA: Diagnosis not present

## 2016-06-30 DIAGNOSIS — I83023 Varicose veins of left lower extremity with ulcer of ankle: Secondary | ICD-10-CM | POA: Diagnosis not present

## 2016-07-08 DIAGNOSIS — I83023 Varicose veins of left lower extremity with ulcer of ankle: Secondary | ICD-10-CM | POA: Diagnosis not present

## 2016-07-14 ENCOUNTER — Encounter (HOSPITAL_BASED_OUTPATIENT_CLINIC_OR_DEPARTMENT_OTHER): Payer: Medicare Other | Attending: Surgery

## 2016-07-14 DIAGNOSIS — Z86718 Personal history of other venous thrombosis and embolism: Secondary | ICD-10-CM | POA: Insufficient documentation

## 2016-07-14 DIAGNOSIS — I2510 Atherosclerotic heart disease of native coronary artery without angina pectoris: Secondary | ICD-10-CM | POA: Diagnosis not present

## 2016-07-14 DIAGNOSIS — I10 Essential (primary) hypertension: Secondary | ICD-10-CM | POA: Insufficient documentation

## 2016-07-14 DIAGNOSIS — Z09 Encounter for follow-up examination after completed treatment for conditions other than malignant neoplasm: Secondary | ICD-10-CM | POA: Diagnosis present

## 2016-07-14 DIAGNOSIS — I739 Peripheral vascular disease, unspecified: Secondary | ICD-10-CM | POA: Insufficient documentation

## 2016-07-14 DIAGNOSIS — Z872 Personal history of diseases of the skin and subcutaneous tissue: Secondary | ICD-10-CM | POA: Diagnosis not present

## 2016-10-14 ENCOUNTER — Encounter: Payer: Self-pay | Admitting: Podiatry

## 2016-10-14 ENCOUNTER — Ambulatory Visit (INDEPENDENT_AMBULATORY_CARE_PROVIDER_SITE_OTHER): Payer: Medicare Other | Admitting: Podiatry

## 2016-10-14 VITALS — BP 127/67 | HR 70 | Temp 97.2°F

## 2016-10-14 DIAGNOSIS — L03032 Cellulitis of left toe: Secondary | ICD-10-CM

## 2016-10-14 DIAGNOSIS — L02612 Cutaneous abscess of left foot: Secondary | ICD-10-CM

## 2016-10-14 MED ORDER — GENTAMICIN SULFATE 0.1 % EX CREA: 1.0000 "application " | TOPICAL_CREAM | Freq: Three times a day (TID) | CUTANEOUS | 1 refills | Status: DC

## 2016-10-14 MED ORDER — AMOXICILLIN-POT CLAVULANATE 875-125 MG PO TABS: 1.0000 | ORAL_TABLET | Freq: Two times a day (BID) | ORAL | 0 refills | Status: DC

## 2016-10-14 NOTE — Progress Notes (Signed)
   Subjective:    Patient ID: Becky Mcmahon, female    DOB: 1930-06-03, 83 y.o.   MRN: [id number]  HPI    Review of Systems  Musculoskeletal: Positive for gait problem.  All other systems reviewed and are negative.      Objective:   Physical Exam        Assessment & Plan:

## 2016-10-14 NOTE — Progress Notes (Signed)
Patient ID: Becky Mcmahon, female   DOB: 1930-11-07, 83 y.o.   MRN: [id number]   HPI: 83 year old otherwise healthy female presents today for abscess and cellulitis to the left great toe. Patient denies trauma. Patient states that the toes been cellulitic with pus draining for the past week. Patient is currently taking doxycycline prescribed by her primary care physician. Patient presents today for follow-up treatment and evaluation.   Physical Exam: General: The patient is alert and oriented x3 in no acute distress.  Dermatology: There appears to be a localized abscess to the lateral border of the left great toenail just proximal to the nail matrix. There is some purulent drainage noted. Erythema and edema also noted to the left great toe. This is localized and there appears to be no streaking or cellulitis of the more proximal foot.  Vascular: Palpable pedal pulses bilaterally. Moderate edema with erythema noted localized left great toe. Capillary refill within normal limits.  Neurological: Epicritic and protective threshold grossly intact bilaterally.   Musculoskeletal Exam: Range of motion within normal limits to all pedal and ankle joints bilateral. Muscle strength 5/5 in all groups bilateral.   Assessment: 1. Abscess with cellulitis left great toe   Plan of Care:  1. Patient was evaluated. 2. Today wearing a perform a localized incision and drainage. Prior to incision and drainage 3 mL of 2% lidocaine plain was utilized for local anesthesia block. Irrigation and debridement was performed using a tissue nipper all the necrotic nonviable tissue down to healthy bleeding viable tissue was performed.. 3. Today cultures were taken 4. Today wearing a change of the patient's antibiotic regimen. There is no improvement with doxycycline. Today were going to prescribe Augmentin for more gram-negative coverage. 5. Prescription for gentamicin cream topical 6. Dry sterile dressing applied.  Recommend daily application of antibiotic cream with a Band-Aid.  7. Postoperative shoe dispensed  8. Return to clinic in 3 weeks    Edrick Kins, DPM [facility]  Dr. Edrick Kins, [address]                                        [place], [place] [address]                Office [phone]  Fax [phone]

## 2016-10-14 NOTE — Addendum Note (Signed)
Addended by: Cranford Mon R on: 10/14/2016 04:41 PM   Modules accepted: Orders

## 2016-10-17 LAB — WOUND CULTURE
GRAM STAIN: NONE SEEN
Gram Stain: NONE SEEN

## 2016-11-04 ENCOUNTER — Ambulatory Visit (INDEPENDENT_AMBULATORY_CARE_PROVIDER_SITE_OTHER): Payer: Medicare Other | Admitting: Podiatry

## 2016-11-04 ENCOUNTER — Encounter: Payer: Self-pay | Admitting: Podiatry

## 2016-11-04 DIAGNOSIS — L02612 Cutaneous abscess of left foot: Secondary | ICD-10-CM

## 2016-11-04 DIAGNOSIS — L03032 Cellulitis of left toe: Secondary | ICD-10-CM

## 2016-11-05 NOTE — Progress Notes (Signed)
Patient ID: Becky Mcmahon, female   DOB: 1931/05/08, 83 y.o.   MRN: [id number]   HPI: 83 year old otherwise healthy female presents today for follow-up evaluation of an abscess to the left great toe. She states she is doing better and has completed the Augmentin. She has been applying gentamicin cream as directed. She has no new complaints at this time.   Physical Exam: General: The patient is alert and oriented x3 in no acute distress.  Dermatology: Skin is warm, dry and supple bilateral lower extremities. Negative for open lesions or macerations.  Vascular: Palpable pedal pulses bilaterally. Moderate edema with erythema noted localized left great toe. Capillary refill within normal limits.  Neurological: Epicritic and protective threshold grossly intact bilaterally.   Musculoskeletal Exam: Range of motion within normal limits to all pedal and ankle joints bilateral. Muscle strength 5/5 in all groups bilateral.   Assessment: 1. Abscess with cellulitis left great toe-resolved   Plan of Care:  1. Patient was evaluated. 2. Continue wearing good shoe gear. 3. Return to clinic when necessary.  Edrick Kins, DPM [facility]  Dr. Edrick Kins, [place]                                        [place], [place] [address]                Office [phone]  Fax [phone]

## 2017-03-17 ENCOUNTER — Encounter: Payer: Self-pay | Admitting: Podiatry

## 2017-03-17 ENCOUNTER — Ambulatory Visit: Payer: Medicare Other | Admitting: Podiatry

## 2017-03-17 DIAGNOSIS — M79676 Pain in unspecified toe(s): Secondary | ICD-10-CM

## 2017-03-17 DIAGNOSIS — L603 Nail dystrophy: Secondary | ICD-10-CM

## 2017-03-17 MED ORDER — GENTAMICIN SULFATE 0.1 % EX CREA: 1.0000 "application " | TOPICAL_CREAM | Freq: Three times a day (TID) | CUTANEOUS | 1 refills | Status: DC

## 2017-03-17 NOTE — Patient Instructions (Signed)

## 2017-03-21 NOTE — Progress Notes (Signed)
   Subjective: Patient presents today for possible treatment and evaluation of tenderness to the left great toenail that has been present for the past 2-3 weeks. She reports intermittent bleeding. Touching the area increases the pain. There are no alleviating factors noted. She has been applying antibiotic cream for treatment. She denies any trauma or injury. Patient presents today for further treatment and evaluation.   Past Medical History:  Diagnosis Date  . Anxiety   . Arthritis   . Breast cancer ([place])    Rt mastectomy  . CAD (coronary artery disease)   . Cerebral atherosclerosis    CAROTID DOPPLER, 06/18/2009 - RIGHT/LEFT BULB AND PROXIMAL ICAs-0-49% diameter reduction  . DVT (deep venous thrombosis) (HCC)    Recurrent, 1st episode after CABG 1997, 2nd episode after hip surgery 2001, placed on life-long coumadin ever since; LEXISCAN, 03/13/2008 - normal, no ECG changes, EKG negative for ischemia  . GERD (gastroesophageal reflux disease)   . Guaiac positive stools   . Hypercholesteremia   . Hyperlipidemia   . Hypertension   . Hypothyroidism   . Osteoporosis   . Ovarian cyst   . Pancreatitis   . 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 Severe aortic stenosis    2D ECHO, 04/22/2012 - EF 60-65%, aortic valve-heavily calcified with restricted leaflet motion, left atrium severly dilated  . Swelling of right lower extremity    LEA VENOUS DUPLEX SCAN, 08/18/2011 - no evidence of acute thrombus or thrombophlebitis  . Type II diabetes mellitus (HCC)    Diet controlled  . Vitamin D deficiency     Objective: Physical Exam General: The patient is alert and oriented x3 in no acute distress.  Dermatology: Hyperkeratotic, discolored, thickened, onychodystrophy of left great toenail.   Skin is warm, dry and supple bilateral lower extremities. Negative for open lesions or  macerations.  Vascular: Palpable pedal pulses bilaterally. No edema or erythema noted. Capillary refill within normal limits.  Neurological: Epicritic and protective threshold grossly intact bilaterally.   Musculoskeletal Exam: Range of motion within normal limits to all pedal and ankle joints bilateral. Muscle strength 5/5 in all groups bilateral.   Assessment: #1dystrophic left great toenail #2 pain around toe  Plan of Care:  #1 Patient was evaluated. #2 Discussed treatment alternatives and plan of care. Explained nail avulsion procedure and post procedure course to patient. #3 Patient opted for total temporary nail avulsion.  #4 Prior to procedure, local anesthesia infiltration utilized using 3 ml of a 50:50 mixture of 2% plain lidocaine and 0.5% plain marcaine in a normal hallux block fashion and a betadine prep performed.  #5 Light dressing applied. #6 Prescription for gentamicin cream given to patient.  #7 Return to clinic in 2 weeks.    Edrick Kins, DPM [facility]  Dr. Edrick Kins, [place]                                        [place], [place] [address]                Office [phone]  Fax [phone]

## 2017-03-31 ENCOUNTER — Ambulatory Visit: Payer: Medicare Other | Admitting: Podiatry

## 2017-03-31 ENCOUNTER — Encounter: Payer: Self-pay | Admitting: Podiatry

## 2017-03-31 DIAGNOSIS — L603 Nail dystrophy: Secondary | ICD-10-CM | POA: Diagnosis not present

## 2017-03-31 DIAGNOSIS — M79676 Pain in unspecified toe(s): Secondary | ICD-10-CM | POA: Diagnosis not present

## 2017-04-06 NOTE — Progress Notes (Signed)
   Subjective: Patient presents today status post total temporary nail avulsion procedure of the left great toenail. Patient states that the toe and nail bed is feeling much better. There are no new complaints at this time. She is here for further evaluation and treatment.    Past Medical History:  Diagnosis Date  . Anxiety   . Arthritis   . Breast cancer ([place])    Rt mastectomy  . CAD (coronary artery disease)   . Cerebral atherosclerosis    CAROTID DOPPLER, 06/18/2009 - RIGHT/LEFT BULB AND PROXIMAL ICAs-0-49% diameter reduction  . DVT (deep venous thrombosis) (HCC)    Recurrent, 1st episode after CABG 1997, 2nd episode after hip surgery 2001, placed on life-long coumadin ever since; LEXISCAN, 03/13/2008 - normal, no ECG changes, EKG negative for ischemia  . GERD (gastroesophageal reflux disease)   . Guaiac positive stools   . Hypercholesteremia   . Hyperlipidemia   . Hypertension   . Hypothyroidism   . Osteoporosis   . Ovarian cyst   . Pancreatitis   . 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 Severe aortic stenosis    2D ECHO, 04/22/2012 - EF 60-65%, aortic valve-heavily calcified with restricted leaflet motion, left atrium severly dilated  . Swelling of right lower extremity    LEA VENOUS DUPLEX SCAN, 08/18/2011 - no evidence of acute thrombus or thrombophlebitis  . Type II diabetes mellitus (HCC)    Diet controlled  . Vitamin D deficiency     Objective: Skin is warm, dry and supple. Nail and respective nail bed appears to be healing appropriately. Open wound to the associated nail bed with a granular wound base and moderate amount of fibrotic tissue. Minimal drainage noted.   Assessment: #1 postop total temporary nail avulsion left great toe #2 open wound periungual nail bed of respective digit.   Plan of care: #1 patient was evaluated  #2 debridement of  open wound was performed to the nail bed of the respective toe using a currette. Antibiotic ointment and Band-Aid was applied. #3 patient is to return to clinic on a PRN  basis.   Edrick Kins, DPM [facility]  Dr. Edrick Kins, [place]                                        [place], [place] [address]                Office [phone]  Fax [phone]

## 2017-08-02 ENCOUNTER — Telehealth: Payer: Self-pay | Admitting: *Deleted

## 2017-08-02 NOTE — Telephone Encounter (Signed)
Pt's dtr, Melissa states pt's toe is infected and they would like an appt this week.

## 2017-08-02 NOTE — Telephone Encounter (Signed)
I spoke with Melissa and she states they have an appt for Monday. I asked her to describe the toe and she said it was red and puffy and tender. I asked if there was any drainage and she stated no and I told her to call if there was drainage or symptoms worsened, to keep the area clean and cover any sores with gentamicin cream. Melissa states they have been cleaning with soap and occasionally soaking in epsom salt and using the gentamicin cream.

## 2017-08-04 ENCOUNTER — Ambulatory Visit: Payer: Medicare Other | Admitting: Podiatry

## 2017-08-09 ENCOUNTER — Encounter: Payer: Self-pay | Admitting: Podiatry

## 2017-08-09 ENCOUNTER — Ambulatory Visit: Payer: Medicare Other | Admitting: Podiatry

## 2017-08-09 DIAGNOSIS — L600 Ingrowing nail: Secondary | ICD-10-CM

## 2017-08-09 NOTE — Patient Instructions (Signed)

## 2017-08-11 NOTE — Progress Notes (Signed)
Subjective: 84 year old female with PMHx of T2DM presents today for evaluation of pain to the medial and lateral borders of the left great toe that has been present for the past two weeks. She reports associated swelling to the areas. Patient is concerned for possible ingrown nail. Applying pressure to the toe increases the pain. She has not done anything to treat the symptoms. Patient presents today for further treatment and evaluation.  Past Medical History:  Diagnosis Date  . Anxiety   . Arthritis   . Breast cancer ([place])    Rt mastectomy  . CAD (coronary artery disease)   . Cerebral atherosclerosis    CAROTID DOPPLER, 06/18/2009 - RIGHT/LEFT BULB AND PROXIMAL ICAs-0-49% diameter reduction  . DVT (deep venous thrombosis) (HCC)    Recurrent, 1st episode after CABG 1997, 2nd episode after hip surgery 2001, placed on life-long coumadin ever since; LEXISCAN, 03/13/2008 - normal, no ECG changes, EKG negative for ischemia  . GERD (gastroesophageal reflux disease)   . Guaiac positive stools   . Hypercholesteremia   . Hyperlipidemia   . Hypertension   . Hypothyroidism   . Osteoporosis   . Ovarian cyst   . Pancreatitis   . 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 Severe aortic stenosis    2D ECHO, 04/22/2012 - EF 60-65%, aortic valve-heavily calcified with restricted leaflet motion, left atrium severly dilated  . Swelling of right lower extremity    LEA VENOUS DUPLEX SCAN, 08/18/2011 - no evidence of acute thrombus or thrombophlebitis  . Type II diabetes mellitus (HCC)    Diet controlled  . Vitamin D deficiency     Objective:  General: Well developed, nourished, in no acute distress, alert and oriented x3   Dermatology: Skin is warm, dry and supple bilateral. Medial and lateral border of the left great toe appears to be erythematous with evidence of an ingrowing nail.  Pain on palpation noted to the border of the nail fold. The remaining nails appear unremarkable at this time. There are no open sores, lesions.  Vascular: Dorsalis Pedis artery and Posterior Tibial artery pedal pulses palpable. No lower extremity edema noted.   Neruologic: Grossly intact via light touch bilateral.  Musculoskeletal: Muscular strength within normal limits in all groups bilateral. Normal range of motion noted to all pedal and ankle joints.   Assesement: #1 Paronychia with ingrowing nail medial and lateral borders left great toe #2 Pain in toe #3 Incurvated nail  Plan of Care:  1. Patient evaluated.  2. Discussed treatment alternatives and plan of care. Explained nail avulsion procedure and post procedure course to patient. 3. Patient opted for permanent partial nail avulsion of the medial and lateral borders of the left great toe.  4. Prior to procedure, local anesthesia infiltration utilized using 3 ml of a 50:50 mixture of 2% plain lidocaine and 0.5% plain marcaine in a normal hallux block fashion and a betadine prep performed.  5. Partial permanent nail avulsion with chemical matrixectomy performed using [id number] applications of phenol followed by alcohol flush.  6. Light dressing applied. 7. Post op shoe dispensed.  8. Return to clinic in 2 weeks.   Edrick Kins, DPM [facility]  Dr. Edrick Kins, DPM    [address]  [place], [place] [address]                Office [phone]  Fax [phone]

## 2017-08-23 ENCOUNTER — Ambulatory Visit: Payer: Medicare Other | Admitting: Podiatry

## 2017-08-23 ENCOUNTER — Encounter: Payer: Self-pay | Admitting: Podiatry

## 2017-08-23 DIAGNOSIS — L600 Ingrowing nail: Secondary | ICD-10-CM

## 2017-08-27 NOTE — Progress Notes (Signed)
   Subjective: Patient presents today 2 weeks post ingrown nail permanent nail avulsion procedure of the medial and lateral borders of the left great toe. She reports some continued tenderness and drainage from the toe. She also has a new complaint of a rash to the left lower extremity that appeared three days ago. She states she applied Neosporin ointment prior to the rash appearing. Patient states that the toe and nail fold is feeling much better.  Past Medical History:  Diagnosis Date  . Anxiety   . Arthritis   . Breast cancer ([place])    Rt mastectomy  . CAD (coronary artery disease)   . Cerebral atherosclerosis    CAROTID DOPPLER, 06/18/2009 - RIGHT/LEFT BULB AND PROXIMAL ICAs-0-49% diameter reduction  . DVT (deep venous thrombosis) (HCC)    Recurrent, 1st episode after CABG 1997, 2nd episode after hip surgery 2001, placed on life-long coumadin ever since; LEXISCAN, 03/13/2008 - normal, no ECG changes, EKG negative for ischemia  . GERD (gastroesophageal reflux disease)   . Guaiac positive stools   . Hypercholesteremia   . Hyperlipidemia   . Hypertension   . Hypothyroidism   . Osteoporosis   . Ovarian cyst   . Pancreatitis   . 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 Severe aortic stenosis    2D ECHO, 04/22/2012 - EF 60-65%, aortic valve-heavily calcified with restricted leaflet motion, left atrium severly dilated  . Swelling of right lower extremity    LEA VENOUS DUPLEX SCAN, 08/18/2011 - no evidence of acute thrombus or thrombophlebitis  . Type II diabetes mellitus (HCC)    Diet controlled  . Vitamin D deficiency     Objective: Skin is warm, dry and supple. Nail and respective nail fold appears to be healing appropriately. Open wound to the associated nail fold with a granular wound base and moderate amount of fibrotic tissue. Minimal drainage noted. Mild erythema  around the periungual region likely due to phenol chemical matricectomy.  Assessment: #1 postop permanent partial nail avulsion medial and lateral borders left great toe #2 open wound periungual nail fold of respective digit.   Plan of care: #1 patient was evaluated  #2 debridement of open wound was performed to the periungual border of the respective toe using a currette. Antibiotic ointment and Band-Aid was applied. #3 patient is to return to clinic on a PRN basis.   Edrick Kins, DPM [facility]  Dr. Edrick Kins, [place]                                        [place], [place] [address]                Office [phone]  Fax [phone]

## 2017-11-05 ENCOUNTER — Emergency Department (HOSPITAL_BASED_OUTPATIENT_CLINIC_OR_DEPARTMENT_OTHER)
Admission: EM | Admit: 2017-11-05 | Discharge: 2017-11-05 | Disposition: A | Discharge status: Home / Self Care | Payer: Medicare Other | Source: Home / Self Care | Attending: Emergency Medicine | Admitting: Emergency Medicine

## 2017-11-05 ENCOUNTER — Encounter (HOSPITAL_COMMUNITY): Payer: Self-pay

## 2017-11-05 ENCOUNTER — Other Ambulatory Visit: Payer: Self-pay

## 2017-11-05 ENCOUNTER — Emergency Department (HOSPITAL_COMMUNITY): Payer: Medicare Other

## 2017-11-05 ENCOUNTER — Emergency Department (HOSPITAL_COMMUNITY)
Admission: EM | Admit: 2017-11-05 | Discharge: 2017-11-05 | Disposition: A | Discharge status: Home / Self Care | Payer: Medicare Other | Attending: Emergency Medicine | Admitting: Emergency Medicine

## 2017-11-05 DIAGNOSIS — Z853 Personal history of malignant neoplasm of breast: Secondary | ICD-10-CM | POA: Insufficient documentation

## 2017-11-05 DIAGNOSIS — M79604 Pain in right leg: Secondary | ICD-10-CM | POA: Diagnosis present

## 2017-11-05 DIAGNOSIS — M79609 Pain in unspecified limb: Secondary | ICD-10-CM | POA: Diagnosis not present

## 2017-11-05 DIAGNOSIS — Z7901 Long term (current) use of anticoagulants: Secondary | ICD-10-CM | POA: Insufficient documentation

## 2017-11-05 DIAGNOSIS — M5416 Radiculopathy, lumbar region: Secondary | ICD-10-CM | POA: Diagnosis not present

## 2017-11-05 DIAGNOSIS — Z951 Presence of aortocoronary bypass graft: Secondary | ICD-10-CM | POA: Insufficient documentation

## 2017-11-05 DIAGNOSIS — E039 Hypothyroidism, unspecified: Secondary | ICD-10-CM | POA: Insufficient documentation

## 2017-11-05 DIAGNOSIS — Z87891 Personal history of nicotine dependence: Secondary | ICD-10-CM | POA: Diagnosis not present

## 2017-11-05 DIAGNOSIS — Z9011 Acquired absence of right breast and nipple: Secondary | ICD-10-CM | POA: Diagnosis not present

## 2017-11-05 DIAGNOSIS — I2510 Atherosclerotic heart disease of native coronary artery without angina pectoris: Secondary | ICD-10-CM | POA: Diagnosis not present

## 2017-11-05 DIAGNOSIS — N3000 Acute cystitis without hematuria: Secondary | ICD-10-CM | POA: Diagnosis not present

## 2017-11-05 DIAGNOSIS — E119 Type 2 diabetes mellitus without complications: Secondary | ICD-10-CM | POA: Insufficient documentation

## 2017-11-05 DIAGNOSIS — Z79899 Other long term (current) drug therapy: Secondary | ICD-10-CM | POA: Diagnosis not present

## 2017-11-05 DIAGNOSIS — I10 Essential (primary) hypertension: Secondary | ICD-10-CM | POA: Insufficient documentation

## 2017-11-05 DIAGNOSIS — M7989 Other specified soft tissue disorders: Secondary | ICD-10-CM

## 2017-11-05 LAB — COMPREHENSIVE METABOLIC PANEL
ALBUMIN: 3.8 g/dL (ref 3.5–5.0)
ALK PHOS: 75 U/L (ref 38–126)
ALT: 15 U/L (ref 0–44)
AST: 25 U/L (ref 15–41)
Anion gap: 8 (ref 5–15)
BUN: 23 mg/dL (ref 8–23)
CALCIUM: 9.5 mg/dL (ref 8.9–10.3)
CO2: 26 mmol/L (ref 22–32)
CREATININE: 1.04 mg/dL — AB (ref 0.44–1.00)
Chloride: 111 mmol/L (ref 98–111)
GFR calc non Af Amer: 48 mL/min — ABNORMAL LOW (ref >60)
GFR, EST AFRICAN AMERICAN: 55 mL/min — AB (ref >60)
GLUCOSE: 93 mg/dL (ref 70–99)
Potassium: 4.1 mmol/L (ref 3.5–5.1)
SODIUM: 145 mmol/L (ref 135–145)
Total Bilirubin: 0.6 mg/dL (ref 0.3–1.2)
Total Protein: 7.2 g/dL (ref 6.5–8.1)

## 2017-11-05 LAB — CBC WITH DIFFERENTIAL/PLATELET
BASOS ABS: 0.1 10*3/uL (ref 0.0–0.1)
BASOS PCT: 1 %
EOS ABS: 0.1 10*3/uL (ref 0.0–0.7)
Eosinophils Relative: 2 %
HCT: 39.5 % (ref 36.0–46.0)
HEMOGLOBIN: 12.6 g/dL (ref 12.0–15.0)
Lymphocytes Relative: 27 %
Lymphs Abs: 1.4 10*3/uL (ref 0.7–4.0)
MCH: 25.1 pg — AB (ref 26.0–34.0)
MCHC: 31.9 g/dL (ref 30.0–36.0)
MCV: 78.8 fL (ref 78.0–100.0)
Monocytes Absolute: 0.7 10*3/uL (ref 0.1–1.0)
Monocytes Relative: 13 %
NEUTROS PCT: 57 %
Neutro Abs: 2.9 10*3/uL (ref 1.7–7.7)
PLATELETS: 220 10*3/uL (ref 150–400)
RBC: 5.01 MIL/uL (ref 3.87–5.11)
RDW: 14.5 % (ref 11.5–15.5)
WBC: 5.1 10*3/uL (ref 4.0–10.5)

## 2017-11-05 LAB — URINALYSIS, ROUTINE W REFLEX MICROSCOPIC
Bilirubin Urine: NEGATIVE
GLUCOSE, UA: NEGATIVE mg/dL
KETONES UR: NEGATIVE mg/dL
NITRITE: POSITIVE — AB
PROTEIN: NEGATIVE mg/dL
Specific Gravity, Urine: 1.009 (ref 1.005–1.030)
WBC, UA: >50 WBC/hpf — ABNORMAL HIGH (ref 0–5)
pH: 6 (ref 5.0–8.0)

## 2017-11-05 LAB — I-STAT CG4 LACTIC ACID, ED: Lactic Acid, Venous: 0.98 mmol/L (ref 0.5–1.9)

## 2017-11-05 LAB — PROTIME-INR
INR: 2.22
Prothrombin Time: 24.4 seconds — ABNORMAL HIGH (ref 11.4–15.2)

## 2017-11-05 MED ORDER — OXYCODONE HCL 5 MG PO TABS: 2.5000 mg | ORAL_TABLET | Freq: Four times a day (QID) | ORAL | 0 refills | Status: DC | PRN

## 2017-11-05 MED ORDER — TRAMADOL HCL 50 MG PO TABS
50.0000 mg | ORAL_TABLET | Freq: Once | ORAL | Status: AC
  Administered 2017-11-05: 50 mg via ORAL
  Filled 2017-11-05: qty 1

## 2017-11-05 MED ORDER — PREDNISONE 20 MG PO TABS: 20.0000 mg | ORAL_TABLET | Freq: Every day | ORAL | 0 refills | Status: AC

## 2017-11-05 MED ORDER — LEVOFLOXACIN 750 MG PO TABS
750.0000 mg | ORAL_TABLET | Freq: Once | ORAL | Status: AC
  Administered 2017-11-05: 750 mg via ORAL
  Filled 2017-11-05: qty 1

## 2017-11-05 MED ORDER — NITROFURANTOIN MONOHYD MACRO 100 MG PO CAPS: 100.0000 mg | ORAL_CAPSULE | Freq: Two times a day (BID) | ORAL | 0 refills | Status: AC

## 2017-11-05 MED ORDER — DOCUSATE SODIUM 250 MG PO CAPS: 250.0000 mg | ORAL_CAPSULE | Freq: Every day | ORAL | 0 refills | Status: DC

## 2017-11-05 MED ORDER — OXYCODONE HCL 5 MG PO TABS: 2.5000 mg | ORAL_TABLET | Freq: Once | ORAL | Status: DC

## 2017-11-05 NOTE — ED Triage Notes (Signed)
Patient reports that she began having right leg pain from the right groin to the foot x 3 days. Patient denies any injury.

## 2017-11-05 NOTE — ED Notes (Signed)
Pt placed into gown 

## 2017-11-05 NOTE — Discharge Instructions (Addendum)
Take the antibiotic as prescribed  For your pain: Take Tylenol (over-the-counter) extra-strength, 2 tablets (1000 mg total) four times a day for 3-5 days Take the prescribed Oxycodone as needed for severe pain Take the prednisone burst (5 day course)  For your back fractures: You may benefit from a kyphoplasty (bone cement injection) in the future if you develop worsening pain. Call your PCP or Spine Surgery to set up an appointment. Try to keep walking No heavy lifting > 10 lb

## 2017-11-05 NOTE — ED Notes (Signed)
Vascular at bedside

## 2017-11-05 NOTE — ED Notes (Signed)
Pt currently in X-ray.

## 2017-11-05 NOTE — Progress Notes (Addendum)
*  Preliminary Results* Bilateral lower extremity venous duplex completed. Bilateral lower extremities are negative for deep vein thrombosis. There is no evidence of right Baker's cyst, there is evidence of left Baker's cyst.  11/05/2017 3:54 PM Maudry Mayhew, BS, RVT, RDCS, RDMS

## 2017-11-05 NOTE — ED Provider Notes (Signed)
[facility] DEPT Provider Note   CSN: [id number] Arrival date & time: 11/05/17  1054     History   Chief Complaint Chief Complaint  Patient presents with  . Leg Pain    HPI Becky Mcmahon is a 84 y.o. female.  HPI 84 year old female with extensive past medical history as below including chronic back pain due to known lumbar compression fractures, hypertension, hyperlipidemia, coronary disease, DVT on Coumadin here with right leg pain.  Patient states that for the last several days, she has had progressively worsening shooting pain down her right leg.  She states it is down her entire right leg and is associated with difficulty walking when it is more severe.  She denies any persistent numbness or weakness.  She has not fallen.  Denies any flank pain.  Denies any abdominal pain, nausea, vomiting, dysuria, frequency, or hematuria.  The pain is aching, throbbing, but intermittently sharp and stabbing.  It is worse with movement and certain palpation.  She has not tried anything for it.  No other complaints.  No fevers or chills.  Past Medical History:  Diagnosis Date  . Anxiety   . Arthritis   . Breast cancer ([place])    Rt mastectomy  . CAD (coronary artery disease)   . Cerebral atherosclerosis    CAROTID DOPPLER, 06/18/2009 - RIGHT/LEFT BULB AND PROXIMAL ICAs-0-49% diameter reduction  . DVT (deep venous thrombosis) (HCC)    Recurrent, 1st episode after CABG 1997, 2nd episode after hip surgery 2001, placed on life-long coumadin ever since; LEXISCAN, 03/13/2008 - normal, no ECG changes, EKG negative for ischemia  . GERD (gastroesophageal reflux disease)   . Guaiac positive stools   . Hypercholesteremia   . Hyperlipidemia   . Hypertension   . Hypothyroidism   . Osteoporosis   . Ovarian cyst   . Pancreatitis   . 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 Severe aortic stenosis    2D ECHO, 04/22/2012 - EF 60-65%, aortic valve-heavily calcified with restricted leaflet motion, left atrium severly dilated  . Swelling of right lower extremity    LEA VENOUS DUPLEX SCAN, 08/18/2011 - no evidence of acute thrombus or thrombophlebitis  . Type II diabetes mellitus (HCC)    Diet controlled  . Vitamin D deficiency     Patient Active Problem List   Diagnosis Date Noted  . 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 Examination of participant in clinical trial 02/17/2013  . Breast cancer ([place]) 08/08/2012  . GERD (gastroesophageal reflux disease) 08/08/2012  . Hypothyroidism, unspecified 08/08/2012  . Osteoporosis 08/08/2012  . Aortic valve disorders 05/27/2012  . 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[place]) 05/27/2012  . Arthritis 05/27/2012  . HTN (hypertension) 05/27/2012  . Hyperlipidemia, unspecified 05/27/2012  . DVT (deep venous thrombosis) ([place])   . AS (aortic stenosis) 05/24/2012  . CAD (coronary artery disease) 05/24/2012  . S/P CABG x 5 10/25/1995    Past Surgical History:  Procedure Laterality Date  . AORTIC VALVE REPLACEMENT    . CARDIOLITE MYOCARDIAL PERFUSION STUDY  06/13/2004   Normal static and dynamic myocardial perfusion images with EF of 73%  . CORONARY ARTERY BYPASS GRAFT  10/25/1995   x5 , Dr Prescott Gum  . HIP ARTHROPLASTY     right  . LAPAROTOMY    .  LEFT AND RIGHT HEART CATHETERIZATION WITH CORONARY/GRAFT ANGIOGRAM N/A 05/23/2012   Procedure: LEFT AND RIGHT HEART CATHETERIZATION WITH Beatrix Fetters;  Surgeon: Sanda Klein, MD;  Location: [facility] CATH LAB;  Service: Cardiovascular;  Laterality: N/A;  . right mastectomy  1979     OB History   None      Home Medications    Prior to Admission medications   Medication Sig Start Date End Date Taking? Authorizing Provider  Ascorbic Acid (VITAMIN C) 1000 MG tablet Take by mouth.   Yes [provider]  aspirin EC 81 MG  tablet Take by mouth.   Yes [provider]  atorvastatin (LIPITOR) 20 MG tablet Take 20 mg by mouth.  08/13/14  Yes [provider]  Cholecalciferol (VITAMIN D) 2000 units tablet Take by mouth.   Yes [provider]  diphenhydrAMINE HCl (ALLERGY MED PO) Take 1 tablet by mouth daily.   Yes [provider]  esomeprazole (NEXIUM) 40 MG capsule Take 40 mg by mouth daily before breakfast.   Yes [provider]  levothyroxine (SYNTHROID, LEVOTHROID) 50 MCG tablet Take by mouth.   Yes [provider]  lisinopril (PRINIVIL,ZESTRIL) 10 MG tablet TK 1 T PO QD 07/07/16  Yes [provider]  LORazepam (ATIVAN) 0.5 MG tablet 1/2 tablet (0.25 mg) by mouth at lunchtime and bedtime   Yes [provider]  Multiple Vitamin (MULTI-VITAMINS) TABS Take by mouth.   Yes [provider]  nystatin (NYSTATIN) powder USE AA UNDER BREAST ONCE TO BID UTD. 05/12/16  Yes [provider]  warfarin (COUMADIN) 5 MG tablet Take 5 mg by mouth daily. Or as directed   Yes [provider]  amoxicillin-clavulanate (AUGMENTIN) 875-125 MG tablet Take 1 tablet by mouth 2 (two) times daily. Patient not taking: Reported on 11/05/2017 10/14/16   Edrick Kins, DPM  docusate sodium (COLACE) 250 MG capsule Take 1 capsule (250 mg total) by mouth daily. 11/05/17   Duffy Bruce, MD  gentamicin cream (GARAMYCIN) 0.1 % Apply 1 application 3 (three) times daily topically. Patient not taking: Reported on 11/05/2017 03/17/17   Edrick Kins, DPM  loratadine (CLARITIN) 10 MG tablet Take by mouth.    [provider]  nitrofurantoin, macrocrystal-monohydrate, (MACROBID) 100 MG capsule Take 1 capsule (100 mg total) by mouth 2 (two) times daily for 5 days. 11/05/17 11/10/17  Duffy Bruce, MD  oxyCODONE (ROXICODONE) 5 MG immediate release tablet Take 0.5-1 tablets (2.5-5 mg total) by mouth every 6 (six) hours as needed for severe pain. 11/05/17   Duffy Bruce, MD  predniSONE (DELTASONE) 20 MG tablet Take 1 tablet (20 mg total) by mouth daily for 5 days. 11/05/17 11/10/17  Duffy Bruce, MD  SANTYL ointment APPLY OINTMENT AA TOPICALLY UTD. 03/26/16   [provider]  traMADol (ULTRAM) 50 MG tablet Take 1 tablet (50 mg total) by mouth every 6 (six) hours as needed. Patient not taking: Reported on 11/05/2017 04/08/16   Tegeler, Gwenyth Allegra, MD    Family History Family History  Problem Relation Age of Onset  . Pancreatitis Mother   . Heart disease Father        family HX  . Heart disease Sister   . Heart disease Brother   . Heart disease Brother 12  . Heart disease Sister 18    Social History Social History   Tobacco Use  . Smoking status: Former Smoker    Packs/day: 0.50    Years: 19.00    Pack  years: 9.50    Last attempt to quit: 05/24/1969    Years since quitting: 48.4  . Smokeless tobacco: Never Used  Substance Use Topics  . Alcohol use: No  . Drug use: No     Allergies   Contrast media  [iodinated diagnostic agents]; Other; Keflex [cephalexin]; Latex; and Neosporin [neomycin-bacitracin zn-polymyx]   Review of Systems Review of Systems  Constitutional: Negative for chills, fatigue and fever.  HENT: Negative for congestion and rhinorrhea.   Eyes: Negative for visual disturbance.  Respiratory: Negative for cough, shortness of breath and wheezing.   Cardiovascular: Negative for chest pain and leg swelling.  Gastrointestinal: Negative for abdominal pain, diarrhea, nausea and vomiting.  Genitourinary: Negative for dysuria and flank pain.  Musculoskeletal: Positive for arthralgias and myalgias. Negative for neck pain and neck stiffness.  Skin: Negative for rash and wound.  Allergic/Immunologic: Negative for immunocompromised state.  Neurological: Negative for syncope, weakness and headaches.  All other systems reviewed and are negative.    Physical Exam Updated Vital Signs BP (!) 152/68 (BP Location:  Left Arm)   Pulse 81   Temp (!) 97.5 F (36.4 C) (Oral)   Resp 14   Ht 5' 6.5" (1.689 m)   Wt 70.3 kg (155 lb)   SpO2 98%   BMI 24.64 kg/m   Physical Exam  Constitutional: She is oriented to person, place, and time. She appears well-developed and well-nourished. No distress.  Well appearing in NAD  HENT:  Head: Normocephalic and atraumatic.  Eyes: Conjunctivae are normal.  Neck: Neck supple.  Cardiovascular: Normal rate, regular rhythm and normal heart sounds. Exam reveals no friction rub.  No murmur heard. Pulmonary/Chest: Effort normal and breath sounds normal. No respiratory distress. She has no wheezes. She has no rales.  Abdominal: She exhibits no distension.  Musculoskeletal: She exhibits no edema.  Neurological: She is alert and oriented to person, place, and time. She exhibits normal muscle tone.  Skin: Skin is warm. Capillary refill takes less than 2 seconds.  Psychiatric: She has a normal mood and affect.  Nursing note and vitals reviewed.   Marland KitchenSpine Exam: Inspection/Palpation: Mild paraspinal TTP over lower lumbar spine. No bruising or deformity. Strength: 5/5 throughout LE bilaterally (hip flexion/extension, adduction/abduction; knee flexion/extension; foot dorsiflexion/plantarflexion, inversion/eversion; great toe inversion) Sensation: Intact to light touch in proximal and distal LE bilaterally Reflexes: 2+ quadriceps and achilles reflexes   ED Treatments / Results  Labs (all labs ordered are listed, but only abnormal results are displayed) Labs Reviewed  CBC WITH DIFFERENTIAL/PLATELET - Abnormal; Notable for the following components:      Result Value   MCH 25.1 (*)    All other components within normal limits  COMPREHENSIVE METABOLIC PANEL - Abnormal; Notable for the following components:   Creatinine, Ser 1.04 (*)    GFR calc non Af Amer 48 (*)    GFR calc Af Amer 55 (*)    All other components within normal limits  URINALYSIS, ROUTINE W REFLEX  MICROSCOPIC - Abnormal; Notable for the following components:   APPearance CLOUDY (*)    Hgb urine dipstick MODERATE (*)    Nitrite POSITIVE (*)    Leukocytes, UA LARGE (*)    WBC, UA >50 (*)    Bacteria, UA MANY (*)    All other components within normal limits  PROTIME-INR - Abnormal; Notable for the following components:   Prothrombin Time 24.4 (*)    All other components within normal limits  I-STAT CG4 LACTIC ACID, ED  EKG None  Radiology Dg Lumbar Spine Complete  Result Date: 11/05/2017 CLINICAL DATA:  Right-sided groin pain. Pain radiates down right leg. No injury. EXAM: LUMBAR SPINE - COMPLETE 4+ VIEW COMPARISON:  MRI 07/23/2014. FINDINGS: Surgical sutures and clips noted over the upper abdomen. Plate and screw fixation of the right pelvis. Diffuse multilevel degenerative change. Mild T12 stable compression fracture. Prominent L1 compression fracture. Aortoiliac atherosclerotic vascular disease. Mild abdominal aortic ectasia. IMPRESSION: 1. Stable mild T12 compression fracture. Stable prominent L1 compression fracture. No acute abnormality identified. 2.  Diffuse osteopenia degenerative change. 3. Aortoiliac and visceral atherosclerotic vascular disease. Mild abdominal aortic ectasia Electronically Signed   By: Marcello Moores  Register   On: 11/05/2017 13:51   Ct Lumbar Spine Wo Contrast  Result Date: 11/05/2017 CLINICAL DATA:  Right leg pain extending into the right groin. EXAM: CT LUMBAR SPINE WITHOUT CONTRAST TECHNIQUE: Multidetector CT imaging of the lumbar spine was performed without intravenous contrast administration. Multiplanar CT image reconstructions were also generated. COMPARISON:  Lumbar spine radiographs 11/05/2017. MRI of lumbar spine 07/23/2014. FINDINGS: Segmentation: 5 non rib-bearing lumbar type vertebral bodies are present. The lowest fully vertebral body is L5. Alignment: AP alignment is anatomic. Vertebrae: There is further collapse of the L1 osteoporotic compression  fracture. This is now a vertebral plana compression fracture. There is slight retropulsion of bone. The canal is narrowed 11 mm. Inferior endplate Schmorl's node is again noted at L3. Vertebral body heights are otherwise normal. Paraspinal and other soft tissues: Atherosclerotic calcifications present in the aorta without aneurysm. Cholecystectomy is noted. A posterior left renal cyst measures 2.5 cm. Other smaller cystic lesions in both kidneys are not well delineated without IV contrast. Disc levels: T12-L1: No significant stenosis. L1-2: No significant stenosis. L2-3: Negative. L3-4: Mild disc bulging is present. There is no significant stenosis. L4-5: Leftward disc protrusion is present. Moderate facet hypertrophy is noted bilaterally. Mild subarticular narrowing is present bilaterally. Foramina are patent. L5-S1: A leftward disc protrusion is present. No significant central canal stenosis is present. The disc extends into both foramina without significant stenosis. IMPRESSION: 1. Progressive collapse of vertebral plana compression fracture at L1 with retropulsed bone but no significant stenosis. 2. Stable inferior endplate Schmorl's node at L3. 3. Disc protrusions and facet hypertrophy at L4-5 and L5-S1. 4. Mild subarticular narrowing bilaterally at L4-5 without other significant stenosis. 5.  Aortic Atherosclerosis (ICD10-I70.0). Electronically Signed   By: San Morelle M.D.   On: 11/05/2017 15:52    Procedures Procedures (including critical care time)  Medications Ordered in ED Medications  oxyCODONE (Oxy IR/ROXICODONE) immediate release tablet 2.5 mg (2.5 mg Oral Not Given 11/05/17 1724)  traMADol (ULTRAM) tablet 50 mg (50 mg Oral Given 11/05/17 1355)  levofloxacin (LEVAQUIN) tablet 750 mg (750 mg Oral Given 11/05/17 1535)     Initial Impression / Assessment and Plan / ED Course  I have reviewed the triage vital signs and the nursing notes.  Pertinent labs & imaging results that were  available during my care of the patient were reviewed by me and considered in my medical decision making (see chart for details).     84 year old female here with intermittent shooting right leg pain.  I suspect this is likely from a lumbar radiculopathy.  Her pain is only intermittent and not persistent.  She has no numbness or weakness.  Reflexes remain intact.  No signs of cauda equina.  Will obtain imaging, basic labs, and reassess.  Given her history of DVT and diffuse pain, with  mild swelling, will also obtain ultrasound.  She is on Coumadin and states she has been compliant.  Imaging as above.  Patient does have severe L1 compression fracture that is now essentially completely collapsed.  This is been an ongoing chronic issue and the patient states she is well aware of this.  She also has disc herniation I suspect this is screaming to her radiculopathy.  There is no evidence of significant central canal stenosis and no evidence of cauda equina clinically.  Otherwise, her lab work is reassuring.  She incidentally has a UTI.  No CVA tenderness, leukocytosis, fever, nausea, or symptoms of pyelonephritis.  Will treat with Macrobid given her allergies.  Otherwise, the patient does feel slightly improved in the ED.  Will treat her conservatively.  I discussed options including spine surgery referral, kyphoplasty, medical management and she would like to elect for symptomatic control at this time.  Will give her a short course of low-dose steroids, treat with analgesics as needed, and refer her to her PCP.  Return precautions were given.  Final Clinical Impressions(s) / ED Diagnoses   Final diagnoses:  Lumbar radiculopathy  Acute cystitis without hematuria    ED Discharge Orders        Ordered    oxyCODONE (ROXICODONE) 5 MG immediate release tablet  Every 6 hours PRN     11/05/17 1612    docusate sodium (COLACE) 250 MG capsule  Daily     11/05/17 1612    nitrofurantoin, macrocrystal-monohydrate,  (MACROBID) 100 MG capsule  2 times daily     11/05/17 1612    predniSONE (DELTASONE) 20 MG tablet  Daily     11/05/17 1612       Duffy Bruce, MD 11/05/17 1945

## 2017-11-09 ENCOUNTER — Emergency Department (HOSPITAL_COMMUNITY): Payer: Medicare Other

## 2017-11-09 ENCOUNTER — Emergency Department (HOSPITAL_COMMUNITY)
Admission: EM | Admit: 2017-11-09 | Discharge: 2017-11-09 | Disposition: A | Discharge status: Home / Self Care | Payer: Medicare Other | Attending: Emergency Medicine | Admitting: Emergency Medicine

## 2017-11-09 ENCOUNTER — Other Ambulatory Visit: Payer: Self-pay

## 2017-11-09 ENCOUNTER — Encounter (HOSPITAL_COMMUNITY): Payer: Self-pay

## 2017-11-09 DIAGNOSIS — E785 Hyperlipidemia, unspecified: Secondary | ICD-10-CM | POA: Diagnosis not present

## 2017-11-09 DIAGNOSIS — Z86718 Personal history of other venous thrombosis and embolism: Secondary | ICD-10-CM | POA: Insufficient documentation

## 2017-11-09 DIAGNOSIS — B029 Zoster without complications: Secondary | ICD-10-CM | POA: Insufficient documentation

## 2017-11-09 DIAGNOSIS — Z7901 Long term (current) use of anticoagulants: Secondary | ICD-10-CM | POA: Diagnosis not present

## 2017-11-09 DIAGNOSIS — Z91040 Latex allergy status: Secondary | ICD-10-CM | POA: Diagnosis not present

## 2017-11-09 DIAGNOSIS — E119 Type 2 diabetes mellitus without complications: Secondary | ICD-10-CM | POA: Diagnosis not present

## 2017-11-09 DIAGNOSIS — E7800 Pure hypercholesterolemia, unspecified: Secondary | ICD-10-CM | POA: Diagnosis not present

## 2017-11-09 DIAGNOSIS — Z79899 Other long term (current) drug therapy: Secondary | ICD-10-CM | POA: Diagnosis not present

## 2017-11-09 DIAGNOSIS — E039 Hypothyroidism, unspecified: Secondary | ICD-10-CM | POA: Insufficient documentation

## 2017-11-09 DIAGNOSIS — Z7982 Long term (current) use of aspirin: Secondary | ICD-10-CM | POA: Diagnosis not present

## 2017-11-09 DIAGNOSIS — Z87891 Personal history of nicotine dependence: Secondary | ICD-10-CM | POA: Insufficient documentation

## 2017-11-09 DIAGNOSIS — Z853 Personal history of malignant neoplasm of breast: Secondary | ICD-10-CM | POA: Diagnosis not present

## 2017-11-09 DIAGNOSIS — Z951 Presence of aortocoronary bypass graft: Secondary | ICD-10-CM | POA: Diagnosis not present

## 2017-11-09 DIAGNOSIS — I10 Essential (primary) hypertension: Secondary | ICD-10-CM | POA: Insufficient documentation

## 2017-11-09 DIAGNOSIS — M5441 Lumbago with sciatica, right side: Secondary | ICD-10-CM | POA: Insufficient documentation

## 2017-11-09 DIAGNOSIS — M545 Low back pain: Secondary | ICD-10-CM | POA: Diagnosis present

## 2017-11-09 DIAGNOSIS — I2510 Atherosclerotic heart disease of native coronary artery without angina pectoris: Secondary | ICD-10-CM | POA: Insufficient documentation

## 2017-11-09 LAB — URINALYSIS, ROUTINE W REFLEX MICROSCOPIC
Bilirubin Urine: NEGATIVE
GLUCOSE, UA: NEGATIVE mg/dL
HGB URINE DIPSTICK: NEGATIVE
Ketones, ur: NEGATIVE mg/dL
LEUKOCYTES UA: NEGATIVE
Nitrite: NEGATIVE
Protein, ur: NEGATIVE mg/dL
Specific Gravity, Urine: 1.019 (ref 1.005–1.030)
pH: 5 (ref 5.0–8.0)

## 2017-11-09 LAB — BASIC METABOLIC PANEL
Anion gap: 8 (ref 5–15)
BUN: 31 mg/dL — AB (ref 8–23)
CALCIUM: 9.5 mg/dL (ref 8.9–10.3)
CHLORIDE: 107 mmol/L (ref 98–111)
CO2: 28 mmol/L (ref 22–32)
CREATININE: 1.23 mg/dL — AB (ref 0.44–1.00)
GFR calc non Af Amer: 39 mL/min — ABNORMAL LOW (ref >60)
GFR, EST AFRICAN AMERICAN: 45 mL/min — AB (ref >60)
Glucose, Bld: 167 mg/dL — ABNORMAL HIGH (ref 70–99)
Potassium: 4.6 mmol/L (ref 3.5–5.1)
SODIUM: 143 mmol/L (ref 135–145)

## 2017-11-09 LAB — CBC WITH DIFFERENTIAL/PLATELET
BASOS ABS: 0 10*3/uL (ref 0.0–0.1)
Basophils Relative: 0 %
EOS ABS: 0 10*3/uL (ref 0.0–0.7)
Eosinophils Relative: 0 %
HCT: 38.5 % (ref 36.0–46.0)
HEMOGLOBIN: 12.1 g/dL (ref 12.0–15.0)
LYMPHS ABS: 1.1 10*3/uL (ref 0.7–4.0)
LYMPHS PCT: 13 %
MCH: 24.9 pg — AB (ref 26.0–34.0)
MCHC: 31.4 g/dL (ref 30.0–36.0)
MCV: 79.2 fL (ref 78.0–100.0)
Monocytes Absolute: 0.4 10*3/uL (ref 0.1–1.0)
Monocytes Relative: 5 %
NEUTROS PCT: 82 %
Neutro Abs: 6.8 10*3/uL (ref 1.7–7.7)
Platelets: 249 10*3/uL (ref 150–400)
RBC: 4.86 MIL/uL (ref 3.87–5.11)
RDW: 15.3 % (ref 11.5–15.5)
WBC: 8.3 10*3/uL (ref 4.0–10.5)

## 2017-11-09 LAB — PROTIME-INR
INR: 2.69
PROTHROMBIN TIME: 28.4 s — AB (ref 11.4–15.2)

## 2017-11-09 LAB — MAGNESIUM: Magnesium: 2.2 mg/dL (ref 1.7–2.4)

## 2017-11-09 MED ORDER — METHYLPREDNISOLONE SODIUM SUCC 125 MG IJ SOLR
125.0000 mg | Freq: Once | INTRAMUSCULAR | Status: AC
  Administered 2017-11-09: 125 mg via INTRAVENOUS
  Filled 2017-11-09: qty 2

## 2017-11-09 MED ORDER — VALACYCLOVIR HCL 500 MG PO TABS
1000.0000 mg | ORAL_TABLET | Freq: Once | ORAL | Status: AC
  Administered 2017-11-09: 1000 mg via ORAL
  Filled 2017-11-09: qty 2

## 2017-11-09 MED ORDER — GABAPENTIN 300 MG PO CAPS
300.0000 mg | ORAL_CAPSULE | Freq: Once | ORAL | Status: AC
  Administered 2017-11-09: 300 mg via ORAL
  Filled 2017-11-09: qty 1

## 2017-11-09 MED ORDER — GABAPENTIN 100 MG PO CAPS: 100.0000 mg | ORAL_CAPSULE | Freq: Three times a day (TID) | ORAL | 0 refills | Status: DC

## 2017-11-09 MED ORDER — MORPHINE SULFATE (PF) 4 MG/ML IV SOLN
4.0000 mg | Freq: Once | INTRAVENOUS | Status: DC
  Filled 2017-11-09: qty 1

## 2017-11-09 MED ORDER — VALACYCLOVIR HCL 1 G PO TABS: 1000.0000 mg | ORAL_TABLET | Freq: Three times a day (TID) | ORAL | 0 refills | Status: DC

## 2017-11-09 NOTE — ED Notes (Signed)
Pt reminded of need for urine 

## 2017-11-09 NOTE — ED Notes (Addendum)
Pt does have rash (raised and reddened pustule like) on right lower leg and shin region. Pt does no know how long rash has been there, states maybe over the past day or so

## 2017-11-09 NOTE — Discharge Instructions (Addendum)
Finish steroids and antibiotics as prescribed from previous visit.   Continue taking oxycodone as prescribed for pain during previous visit.   Add valtrex for shingles   Take gabapentin as it can help your back pain and pain from shingles.   Follow up with your doctor and neurosurgeon   Return to ER if you have uncontrolled pain, trouble walking, weakness, fever, vomiting.

## 2017-11-09 NOTE — ED Provider Notes (Signed)
  Physical Exam  BP (!) 148/73 (BP Location: Left Arm)   Pulse 77   Temp 98.5 F (36.9 C) (Oral)   Resp 17   Ht 5' 6.5" (1.689 m)   Wt 70.3 kg (155 lb)   SpO2 93%   BMI 24.64 kg/m   Physical Exam  ED Course/Procedures     Procedures  MDM  Her MRI and urine are reassuring.  Will discharge with Valtrex and gabapentin.  Outpatient follow-up.  Has been seen by social work with no new treatment needed.  Discharge home.       Davonna Belling, MD 11/09/17 1904

## 2017-11-09 NOTE — ED Provider Notes (Signed)
[facility] DEPT Provider Note   CSN: [id number] Arrival date & time: 11/09/17  1338     History   Chief Complaint Chief Complaint  Patient presents with  . Fall    HPI Becky Mcmahon is a 84 y.o. female history of CAD status post CABG, DVT on Coumadin, reflux, high cholesterol, here presenting with weakness, fall.  Patient states that she was seen in the ED about 5 days ago and was diagnosed with compression fractures on lumbar CT. patient was sent home with oxycodone for pain.  Patient states that since then, her right leg gives out and she felt weak on the right leg.  She states that her pain radiates from her back down the right leg.  She states that she had repeated falls due to the pain and weakness.  Denies any bowel or bladder incontinence.  Patient states that also since yesterday she may have some rash in the right leg.  She does have a history of chickenpox in the past but did get a shingles shot.   The history is provided by the patient.    Past Medical History:  Diagnosis Date  . Anxiety   . Arthritis   . Breast cancer ([place])    Rt mastectomy  . CAD (coronary artery disease)   . Cerebral atherosclerosis    CAROTID DOPPLER, 06/18/2009 - RIGHT/LEFT BULB AND PROXIMAL ICAs-0-49% diameter reduction  . DVT (deep venous thrombosis) (HCC)    Recurrent, 1st episode after CABG 1997, 2nd episode after hip surgery 2001, placed on life-long coumadin ever since; LEXISCAN, 03/13/2008 - normal, no ECG changes, EKG negative for ischemia  . GERD (gastroesophageal reflux disease)   . Guaiac positive stools   . Hypercholesteremia   . Hyperlipidemia   . Hypertension   . Hypothyroidism   . Osteoporosis   . Ovarian cyst   . Pancreatitis   . 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 Severe aortic stenosis    2D ECHO, 04/22/2012 - EF  60-65%, aortic valve-heavily calcified with restricted leaflet motion, left atrium severly dilated  . Swelling of right lower extremity    LEA VENOUS DUPLEX SCAN, 08/18/2011 - no evidence of acute thrombus or thrombophlebitis  . Type II diabetes mellitus (HCC)    Diet controlled  . Vitamin D deficiency     Patient Active Problem List   Diagnosis Date Noted  . 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 Examination of participant in clinical trial 02/17/2013  . Breast cancer ([place]) 08/08/2012  . GERD (gastroesophageal reflux disease) 08/08/2012  . Hypothyroidism, unspecified 08/08/2012  . Osteoporosis 08/08/2012  . Aortic valve disorders 05/27/2012  . 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[place]) 05/27/2012  . Arthritis 05/27/2012  . HTN (hypertension) 05/27/2012  . Hyperlipidemia, unspecified 05/27/2012  . DVT (deep venous thrombosis) ([place])   . AS (aortic stenosis) 05/24/2012  . CAD (coronary artery disease) 05/24/2012  . S/P CABG x 5 10/25/1995    Past Surgical History:  Procedure Laterality Date  . AORTIC VALVE REPLACEMENT    . CARDIOLITE MYOCARDIAL PERFUSION STUDY  06/13/2004   Normal static and dynamic myocardial perfusion images with EF of 73%  . CORONARY ARTERY BYPASS GRAFT  10/25/1995   x5 , Dr Prescott Gum  . HIP ARTHROPLASTY     right  .  LAPAROTOMY    . LEFT AND RIGHT HEART CATHETERIZATION WITH CORONARY/GRAFT ANGIOGRAM N/A 05/23/2012   Procedure: LEFT AND RIGHT HEART CATHETERIZATION WITH Beatrix Fetters;  Surgeon: Sanda Klein, MD;  Location: [facility] CATH LAB;  Service: Cardiovascular;  Laterality: N/A;  . right mastectomy  1979     OB History   None      Home Medications    Prior to Admission medications   Medication Sig Start Date End Date Taking? Authorizing Provider  Ascorbic Acid (VITAMIN C) 1000 MG tablet Take by mouth.   Yes [provider]  aspirin EC 81 MG tablet Take by mouth.   Yes [provider]  atorvastatin (LIPITOR) 20  MG tablet Take 20 mg by mouth.  08/13/14  Yes [provider]  Cholecalciferol (VITAMIN D) 2000 units tablet Take by mouth.   Yes [provider]  diphenhydrAMINE HCl (ALLERGY MED PO) Take 1 tablet by mouth daily.   Yes [provider]  docusate sodium (COLACE) 250 MG capsule Take 1 capsule (250 mg total) by mouth daily. 11/05/17  Yes Duffy Bruce, MD  esomeprazole (NEXIUM) 40 MG capsule Take 40 mg by mouth daily before breakfast.   Yes [provider]  levothyroxine (SYNTHROID, LEVOTHROID) 50 MCG tablet Take by mouth.   Yes [provider]  lisinopril (PRINIVIL,ZESTRIL) 10 MG tablet TK 1 T PO QD 07/07/16  Yes [provider]  LORazepam (ATIVAN) 0.5 MG tablet 1/2 tablet (0.25 mg) by mouth at lunchtime and bedtime   Yes [provider]  Multiple Vitamin (MULTI-VITAMINS) TABS Take by mouth.   Yes [provider]  nitrofurantoin, macrocrystal-monohydrate, (MACROBID) 100 MG capsule Take 1 capsule (100 mg total) by mouth 2 (two) times daily for 5 days. 11/05/17 11/10/17 Yes Duffy Bruce, MD  nystatin (NYSTATIN) powder USE AA UNDER BREAST ONCE TO BID UTD. 05/12/16  Yes [provider]  oxyCODONE (ROXICODONE) 5 MG immediate release tablet Take 0.5-1 tablets (2.5-5 mg total) by mouth every 6 (six) hours as needed for severe pain. 11/05/17  Yes Duffy Bruce, MD  predniSONE (DELTASONE) 20 MG tablet Take 1 tablet (20 mg total) by mouth daily for 5 days. 11/05/17 11/10/17 Yes Duffy Bruce, MD  warfarin (COUMADIN) 5 MG tablet Take 5 mg by mouth daily. Or as directed   Yes [provider]  amoxicillin-clavulanate (AUGMENTIN) 875-125 MG tablet Take 1 tablet by mouth 2 (two) times daily. Patient not taking: Reported on 11/05/2017 10/14/16   Edrick Kins, DPM  gabapentin (NEURONTIN) 100 MG capsule Take 1 capsule (100 mg total) by mouth 3 (three) times daily. 11/09/17   Drenda Freeze, MD  gentamicin cream (GARAMYCIN) 0.1 %  Apply 1 application 3 (three) times daily topically. Patient not taking: Reported on 11/05/2017 03/17/17   Edrick Kins, DPM  traMADol (ULTRAM) 50 MG tablet Take 1 tablet (50 mg total) by mouth every 6 (six) hours as needed. Patient not taking: Reported on 11/05/2017 04/08/16   Tegeler, Gwenyth Allegra, MD  valACYclovir (VALTREX) 1000 MG tablet Take 1 tablet (1,000 mg total) by mouth 3 (three) times daily. 11/09/17   Drenda Freeze, MD    Family History Family History  Problem Relation Age of Onset  . Pancreatitis Mother   . Heart disease Father        family HX  . Heart disease Sister   . Heart disease Brother   . Heart disease Brother 70  . Heart disease Sister 2    Social History  Social History   Tobacco Use  . Smoking status: Former Smoker    Packs/day: 0.50    Years: 19.00    Pack years: 9.50    Last attempt to quit: 05/24/1969    Years since quitting: 48.4  . Smokeless tobacco: Never Used  Substance Use Topics  . Alcohol use: No  . Drug use: No     Allergies   Contrast media  [iodinated diagnostic agents]; Other; Keflex [cephalexin]; Latex; and Neosporin [neomycin-bacitracin zn-polymyx]   Review of Systems Review of Systems  Musculoskeletal: Positive for back pain.  Skin: Positive for rash.  All other systems reviewed and are negative.    Physical Exam Updated Vital Signs BP 127/62 (BP Location: Left Arm)   Pulse 88   Temp 98.5 F (36.9 C) (Oral)   Resp 14   Ht 5' 6.5" (1.689 m)   Wt 70.3 kg (155 lb)   SpO2 94%   BMI 24.64 kg/m   Physical Exam  Constitutional: She is oriented to person, place, and time.  Uncomfortable, chronically ill   HENT:  Head: Normocephalic.  Mouth/Throat: Oropharynx is clear and moist.  Eyes: Pupils are equal, round, and reactive to light. Conjunctivae and EOM are normal.  Neck: Normal range of motion. Neck supple.  Cardiovascular: Normal rate, regular rhythm and normal heart sounds.  Pulmonary/Chest: Effort normal and  breath sounds normal. No stridor. No respiratory distress. She has no wheezes.  Abdominal: Soft. Bowel sounds are normal. She exhibits no distension. There is no tenderness. There is no guarding.  Musculoskeletal:  Minimal lower lumbar tenderness, nl ROM bilateral hips, no obvious hip deformity. She has pustules and some vesicular lesions lateral aspect R calf and shin area.   Neurological: She is alert and oriented to person, place, and time.  + straight leg raise R leg. No saddle anesthesia. Nl sensation throughout. Strength 5/5 throughout. However, she needed two people assist to walk.   Skin: Skin is warm.  Psychiatric: She has a normal mood and affect.  Nursing note and vitals reviewed.    ED Treatments / Results  Labs (all labs ordered are listed, but only abnormal results are displayed) Labs Reviewed  BASIC METABOLIC PANEL - Abnormal; Notable for the following components:      Result Value   Glucose, Bld 167 (*)    BUN 31 (*)    Creatinine, Ser 1.23 (*)    GFR calc non Af Amer 39 (*)    GFR calc Af Amer 45 (*)    All other components within normal limits  PROTIME-INR - Abnormal; Notable for the following components:   Prothrombin Time 28.4 (*)    All other components within normal limits  URINE CULTURE  MAGNESIUM  CBC WITH DIFFERENTIAL/PLATELET  URINALYSIS, ROUTINE W REFLEX MICROSCOPIC    EKG None  Radiology Dg Lumbar Spine Complete  Result Date: 11/09/2017 CLINICAL DATA:  Low back and right hip pain after fall today. EXAM: LUMBAR SPINE - COMPLETE 4+ VIEW COMPARISON:  CT scan of November 05, 2017. FINDINGS: Stable old L1 compression fracture is noted. Diffuse osteopenia is noted. No acute fracture or spondylolisthesis is noted. Atherosclerosis of abdominal aorta is noted. IMPRESSION: Stable old L1 compression fracture.  No acute abnormality seen. Aortic Atherosclerosis (ICD10-I70.0). Electronically Signed   By: Marijo Conception, M.D.   On: 11/09/2017 15:34   Dg Hip Unilat W  Or Wo Pelvis 2-3 Views Right  Result Date: 11/09/2017 CLINICAL DATA:  Right hip pain after fall today.  EXAM: DG HIP (WITH OR WITHOUT PELVIS) 2-3V RIGHT COMPARISON:  None. FINDINGS: Status post surgical internal fixation of old right acetabular fracture. No acute fracture or dislocation is noted. Vascular calcifications are noted. Probable degenerative changes seen involving the right hip joint. IMPRESSION: Postsurgical and degenerative changes as described above. No acute abnormality seen in the right hip. Electronically Signed   By: Marijo Conception, M.D.   On: 11/09/2017 15:36    Procedures Procedures (including critical care time)  Medications Ordered in ED Medications  morphine 4 MG/ML injection 4 mg (4 mg Intravenous Refused 11/09/17 1507)  gabapentin (NEURONTIN) capsule 300 mg (has no administration in time range)  methylPREDNISolone sodium succinate (SOLU-MEDROL) 125 mg/2 mL injection 125 mg (125 mg Intravenous Given 11/09/17 1459)  valACYclovir (VALTREX) tablet 1,000 mg (1,000 mg Oral Given 11/09/17 1456)     Initial Impression / Assessment and Plan / ED Course  I have reviewed the triage vital signs and the nursing notes.  Pertinent labs & imaging results that were available during my care of the patient were reviewed by me and considered in my medical decision making (see chart for details).    HAZELLE WOOLLARD is a 84 y.o. female here with back pain, R leg weakness. Patient was diagnosed with lumbar compression fracture several days ago now has worse R leg pain and weakness. Consider worsening radiculopathy. Patient is using walker and wheelchair at home but has trouble walking despite maximal assistance. She also has possible shingles R leg which may cause worsening pain and radicular symptoms. Will get labs, UA, INR (patient on coumadin), MRI lumbar spine, xrays. Will give pain meds and steroids and valtrex. Will consult social work regarding placement.   3:30 pm Labs pending.  xrays showed known L1 fracture. Refused IV morphine in the ED. Consulted social work regarding placement. MRI lumbar spine pending. Of note, patient is already on steroids from previous visit.   4:05 pm MRI pending. Signed out to Dr. Alvino Chapel to follow up MRI, reassess patient and follow up social work.    Final Clinical Impressions(s) / ED Diagnoses   Final diagnoses:  Acute right-sided low back pain with right-sided sciatica  Herpes zoster without complication    ED Discharge Orders        Ordered    valACYclovir (VALTREX) 1000 MG tablet  3 times daily     11/09/17 1546    gabapentin (NEURONTIN) 100 MG capsule  3 times daily     11/09/17 1548       Drenda Freeze, MD 11/09/17 [phone]

## 2017-11-09 NOTE — ED Triage Notes (Signed)
Pt reports mechanical fall today while walking. Denies becoming dizzy. C/o right hip pain radiating down into leg. Hx right leg pain recently seen for pain of right leg.

## 2017-11-09 NOTE — ED Notes (Signed)
Pt has pure wick in place for UA 

## 2017-11-09 NOTE — Progress Notes (Addendum)
Consult request has been received. CSW attempting to follow up at present time.  CSW reviewed chart and spoke to the EDP.  Per EPD pt has no acute issues.  CSW met with family and asked if there were any social work needs the CSW could be of assistance with, if there were any needed resources or if there was any information that the CSW could provide? Family stated no, that they were awaiting the retuern of the RN in order to complete the necessary steps to D/C and return home.  EDP updated.  Please reconsult if future social work needs arise.  CSW signing off, as social work intervention is no longer needed.  Alphonse Guild. Juli Odom, LCSW, LCAS, CSI Clinical Social Worker Ph: [phone]

## 2017-11-11 LAB — URINE CULTURE: Culture: NO GROWTH

## 2017-12-27 ENCOUNTER — Emergency Department (HOSPITAL_COMMUNITY)
Admission: EM | Admit: 2017-12-27 | Discharge: 2017-12-27 | Disposition: A | Discharge status: Home / Self Care | Payer: Medicare Other | Attending: Emergency Medicine | Admitting: Emergency Medicine

## 2017-12-27 ENCOUNTER — Emergency Department (HOSPITAL_COMMUNITY): Payer: Medicare Other

## 2017-12-27 ENCOUNTER — Encounter (HOSPITAL_COMMUNITY): Payer: Self-pay | Admitting: Emergency Medicine

## 2017-12-27 DIAGNOSIS — S20219A Contusion of unspecified front wall of thorax, initial encounter: Secondary | ICD-10-CM | POA: Diagnosis not present

## 2017-12-27 DIAGNOSIS — Z951 Presence of aortocoronary bypass graft: Secondary | ICD-10-CM | POA: Diagnosis not present

## 2017-12-27 DIAGNOSIS — Z7901 Long term (current) use of anticoagulants: Secondary | ICD-10-CM | POA: Diagnosis not present

## 2017-12-27 DIAGNOSIS — I2510 Atherosclerotic heart disease of native coronary artery without angina pectoris: Secondary | ICD-10-CM | POA: Insufficient documentation

## 2017-12-27 DIAGNOSIS — Y929 Unspecified place or not applicable: Secondary | ICD-10-CM | POA: Diagnosis not present

## 2017-12-27 DIAGNOSIS — Z96641 Presence of right artificial hip joint: Secondary | ICD-10-CM | POA: Diagnosis not present

## 2017-12-27 DIAGNOSIS — Z91040 Latex allergy status: Secondary | ICD-10-CM | POA: Diagnosis not present

## 2017-12-27 DIAGNOSIS — Z87891 Personal history of nicotine dependence: Secondary | ICD-10-CM | POA: Diagnosis not present

## 2017-12-27 DIAGNOSIS — I10 Essential (primary) hypertension: Secondary | ICD-10-CM | POA: Diagnosis not present

## 2017-12-27 DIAGNOSIS — Z7982 Long term (current) use of aspirin: Secondary | ICD-10-CM | POA: Diagnosis not present

## 2017-12-27 DIAGNOSIS — Y999 Unspecified external cause status: Secondary | ICD-10-CM | POA: Diagnosis not present

## 2017-12-27 DIAGNOSIS — W19XXXA Unspecified fall, initial encounter: Secondary | ICD-10-CM | POA: Insufficient documentation

## 2017-12-27 DIAGNOSIS — E039 Hypothyroidism, unspecified: Secondary | ICD-10-CM | POA: Diagnosis not present

## 2017-12-27 DIAGNOSIS — Y939 Activity, unspecified: Secondary | ICD-10-CM | POA: Diagnosis not present

## 2017-12-27 DIAGNOSIS — S299XXA Unspecified injury of thorax, initial encounter: Secondary | ICD-10-CM | POA: Diagnosis present

## 2017-12-27 MED ORDER — HYDROCODONE-ACETAMINOPHEN 5-325 MG PO TABS: 1.0000 | ORAL_TABLET | Freq: Four times a day (QID) | ORAL | 0 refills | Status: DC | PRN

## 2017-12-27 MED ORDER — DOCUSATE SODIUM 100 MG PO CAPS: 100.0000 mg | ORAL_CAPSULE | Freq: Two times a day (BID) | ORAL | 0 refills | Status: DC

## 2017-12-27 MED ORDER — HYDROCODONE-ACETAMINOPHEN 5-325 MG PO TABS
1.0000 | ORAL_TABLET | Freq: Once | ORAL | Status: AC
  Administered 2017-12-27: 1 via ORAL
  Filled 2017-12-27: qty 1

## 2017-12-27 NOTE — ED Triage Notes (Signed)
Pt has shingles and weak right leg with nerve pains and patient got up the other night couple weeks ago and fell into wall. Saw PA last week and had xray done with no fractures. Pt c./o severe pain in abd and her back over the weekend.

## 2017-12-27 NOTE — ED Provider Notes (Addendum)
[facility] DEPT Provider Note   CSN: [id number] Arrival date & time: 12/27/17  1016     History   Chief Complaint Chief Complaint  Patient presents with  . Fall  . torso pain    HPI Becky Mcmahon is a 84 y.o. female.  HPI  84 year old female presents with right-sided back and chest pain.  A couple weeks ago she fell landing on that side.  She went to her doctor and had negative x-rays of her back and ribs.  Pain has persisted and continued.  Now seems to be in the anterior chest as well.  It is a continuous pain.  Movement makes it worse.  No shortness of breath.  No abdominal pain besides the low ribs.  No vomiting.  She is taking tramadol without relief.  Patient also has had a sacral decubitus wound for the last 2 or 3 weeks.  She is been having significant decreased mobility because of the right leg weakness after having zoster in that leg.  Past Medical History:  Diagnosis Date  . Anxiety   . Arthritis   . Breast cancer ([place])    Rt mastectomy  . CAD (coronary artery disease)   . Cerebral atherosclerosis    CAROTID DOPPLER, 06/18/2009 - RIGHT/LEFT BULB AND PROXIMAL ICAs-0-49% diameter reduction  . DVT (deep venous thrombosis) (HCC)    Recurrent, 1st episode after CABG 1997, 2nd episode after hip surgery 2001, placed on life-long coumadin ever since; LEXISCAN, 03/13/2008 - normal, no ECG changes, EKG negative for ischemia  . GERD (gastroesophageal reflux disease)   . Guaiac positive stools   . Hypercholesteremia   . Hyperlipidemia   . Hypertension   . Hypothyroidism   . Osteoporosis   . Ovarian cyst   . Pancreatitis   . 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 Severe aortic stenosis    2D ECHO, 04/22/2012 - EF 60-65%, aortic valve-heavily calcified with restricted leaflet motion, left atrium severly dilated  .  Swelling of right lower extremity    LEA VENOUS DUPLEX SCAN, 08/18/2011 - no evidence of acute thrombus or thrombophlebitis  . Type II diabetes mellitus (HCC)    Diet controlled  . Vitamin D deficiency     Patient Active Problem List   Diagnosis Date Noted  . 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 Examination of participant in clinical trial 02/17/2013  . Breast cancer ([place]) 08/08/2012  . GERD (gastroesophageal reflux disease) 08/08/2012  . Hypothyroidism, unspecified 08/08/2012  . Osteoporosis 08/08/2012  . Aortic valve disorders 05/27/2012  . 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[place]) 05/27/2012  . Arthritis 05/27/2012  . HTN (hypertension) 05/27/2012  . Hyperlipidemia, unspecified 05/27/2012  . DVT (deep venous thrombosis) ([place])   . AS (aortic stenosis) 05/24/2012  . CAD (coronary artery disease) 05/24/2012  . S/P CABG x 5 10/25/1995    Past Surgical History:  Procedure Laterality Date  . AORTIC VALVE REPLACEMENT    . CARDIOLITE MYOCARDIAL PERFUSION STUDY  06/13/2004   Normal static and dynamic myocardial perfusion images with EF of 73%  . CORONARY ARTERY BYPASS GRAFT  10/25/1995   x5 , Dr Prescott Gum  . HIP ARTHROPLASTY     right  . LAPAROTOMY    . LEFT AND RIGHT HEART CATHETERIZATION WITH CORONARY/GRAFT ANGIOGRAM N/A 05/23/2012  Procedure: LEFT AND RIGHT HEART CATHETERIZATION WITH Beatrix Fetters;  Surgeon: Sanda Klein, MD;  Location: [facility] CATH LAB;  Service: Cardiovascular;  Laterality: N/A;  . right mastectomy  1979     OB History   None      Home Medications    Prior to Admission medications   Medication Sig Start Date End Date Taking? Authorizing Provider  acetaminophen (TYLENOL) 650 MG CR tablet Take 1,300 mg by mouth every 8 (eight) hours as needed for pain.   Yes [provider]  Ascorbic Acid (VITAMIN C) 1000 MG tablet Take 1,000 mg by mouth daily.    Yes [provider]  aspirin EC 81 MG tablet Take 81 mg by mouth  every evening.    Yes [provider]  atorvastatin (LIPITOR) 20 MG tablet Take 20 mg by mouth daily at 6 PM.  08/13/14  Yes [provider]  Cholecalciferol (VITAMIN D) 2000 units tablet Take 2,000 Units by mouth every evening.    Yes [provider]  esomeprazole (NEXIUM) 40 MG capsule Take 40 mg by mouth daily before breakfast.   Yes [provider]  gabapentin (NEURONTIN) 300 MG capsule Take 300 mg by mouth 3 (three) times daily. 12/15/17  Yes [provider]  levothyroxine (SYNTHROID, LEVOTHROID) 50 MCG tablet Take 50 mcg by mouth daily before breakfast.    Yes [provider]  lisinopril (PRINIVIL,ZESTRIL) 10 MG tablet Take 10 mg by mouth daily.  07/07/16  Yes [provider]  loratadine (CLARITIN) 10 MG tablet Take 10 mg by mouth daily.   Yes [provider]  LORazepam (ATIVAN) 0.5 MG tablet Take 0.25 mg by mouth See admin instructions. Takes at lunchtime and bedtime.   Yes [provider]  Multiple Vitamin (MULTI-VITAMINS) TABS Take 1 tablet by mouth daily.    Yes [provider]  nystatin (NYSTATIN) powder Apply 1 g topically daily. Applies under breasts. 05/12/16  Yes [provider]  warfarin (COUMADIN) 5 MG tablet Take 2.5-5 mg by mouth See admin instructions. Takes 5mg  daily with supper except takes 2.5mg  on Mon, Wed, Fri.   Yes [provider]  docusate sodium (COLACE) 100 MG capsule Take 1 capsule (100 mg total) by mouth every 12 (twelve) hours. 12/27/17   Sherwood Gambler, MD  gabapentin (NEURONTIN) 100 MG capsule Take 1 capsule (100 mg total) by mouth 3 (three) times daily. Patient not taking: Reported on 12/27/2017 11/09/17   Drenda Freeze, MD  HYDROcodone-acetaminophen [facility]) 5-325 MG tablet Take 1 tablet by mouth every 6 (six) hours as needed for severe pain. 12/27/17   Sherwood Gambler, MD  oxyCODONE (ROXICODONE) 5 MG immediate release tablet Take 0.5-1 tablets (2.5-5 mg total) by  mouth every 6 (six) hours as needed for severe pain. Patient not taking: Reported on 12/27/2017 11/05/17   Duffy Bruce, MD  valACYclovir (VALTREX) 1000 MG tablet Take 1 tablet (1,000 mg total) by mouth 3 (three) times daily. Patient not taking: Reported on 12/27/2017 11/09/17   Drenda Freeze, MD    Family History Family History  Problem Relation Age of Onset  . Pancreatitis Mother   . Heart disease Father        family HX  . Heart disease Sister   . Heart disease Brother   . Heart disease Brother 95  . Heart disease Sister 24    Social History Social History   Tobacco Use  . Smoking status: Former Smoker    Packs/day: 0.50  Years: 19.00    Pack years: 9.50    Last attempt to quit: 05/24/1969    Years since quitting: 48.6  . Smokeless tobacco: Never Used  Substance Use Topics  . Alcohol use: No  . Drug use: No     Allergies   Contrast media  [iodinated diagnostic agents]; Keflex [cephalexin]; Latex; and Neosporin [neomycin-bacitracin zn-polymyx]   Review of Systems Review of Systems  Respiratory: Negative for shortness of breath.   Cardiovascular: Positive for chest pain.  Gastrointestinal: Negative for abdominal pain and vomiting.  Musculoskeletal: Positive for back pain.  All other systems reviewed and are negative.    Physical Exam Updated Vital Signs BP (!) 142/62 (BP Location: Left Arm)   Pulse 95   Temp 98.3 F (36.8 C) (Oral)   Resp 16   SpO2 97%   Physical Exam  Constitutional: She is oriented to person, place, and time. She appears well-developed and well-nourished.  HENT:  Head: Normocephalic and atraumatic.  Right Ear: External ear normal.  Left Ear: External ear normal.  Nose: Nose normal.  Eyes: Right eye exhibits no discharge. Left eye exhibits no discharge.  Cardiovascular: Normal rate, regular rhythm and normal heart sounds.  Pulmonary/Chest: Effort normal and breath sounds normal.     She exhibits tenderness.    Abdominal:  Soft. There is no tenderness.  Neurological: She is alert and oriented to person, place, and time.  Skin: Skin is warm and dry. No rash noted.  Stage 1 decubitus pressure injury to sacrum. No signs of acute infection  Nursing note and vitals reviewed.    ED Treatments / Results  Labs (all labs ordered are listed, but only abnormal results are displayed) Labs Reviewed - No data to display  EKG None  Radiology Dg Ribs Bilateral W/chest  Result Date: 12/27/2017 CLINICAL DATA:  Bilateral chest wall pain since a fall 2 weeks ago after slipping at home. EXAM: BILATERAL RIBS AND CHEST - 4+ VIEW COMPARISON:  Chest x-ray of April 07, 2016 FINDINGS: The lungs are adequately inflated. There is a small right pleural effusion. There is no alveolar infiltrate. The patient has undergone previous CABG and ascending aortic stent graft placement. There is calcification in the wall of the aortic arch. Rib detail images reveal no definite acute displaced fractures. However, the images are limited due to the patient's clinical condition and body habitus and due to osteopenia. Old deformity of the lateral aspect of the right seventh rib is demonstrated. IMPRESSION: No definite acute displaced rib fractures. There is old deformity of the lateral aspect of the right seventh rib. Chronic bronchitic changes, stable. Previous CABG and ascending aortic stent graft placement. No overt CHF. Electronically Signed   By: David  Martinique M.D.   On: 12/27/2017 12:42    Procedures Procedures (including critical care time)  Medications Ordered in ED Medications  HYDROcodone-acetaminophen (NORCO/VICODIN) 5-325 MG per tablet 1 tablet (1 tablet Oral Given 12/27/17 1121)     Initial Impression / Assessment and Plan / ED Course  I have reviewed the triage vital signs and the nursing notes.  Pertinent labs & imaging results that were available during my care of the patient were reviewed by me and considered in my medical  decision making (see chart for details).     Patient is feeling much better with hydrocodone.  This appears to be a muscular type pain, possibly rib contusion.  Going around to the chest is probably related to muscle pain.  Tramadol is not helping  so we will give her with Norco for short course instead.  Discussed strict return precautions but I highly doubt some intra-abdominal or intrathoracic emergency. Discussed moving patient frequently at home to help prevent pressure wounds. Discharged home with return precautions.  Final Clinical Impressions(s) / ED Diagnoses   Final diagnoses:  Fall, initial encounter  Contusion of rib, unspecified laterality, initial encounter    ED Discharge Orders         Ordered    HYDROcodone-acetaminophen (NORCO) 5-325 MG tablet  Every 6 hours PRN     12/27/17 1302    docusate sodium (COLACE) 100 MG capsule  Every 12 hours     12/27/17 1302           Sherwood Gambler, MD 12/27/17 1352    Sherwood Gambler, MD 12/27/17 1353

## 2017-12-27 NOTE — ED Notes (Signed)
Patient verbalized understanding of discharge instructions, no questions. Patient out of ED via wheelchair. Appears uncomfortable.

## 2017-12-27 NOTE — Discharge Instructions (Addendum)
If your pain worsens or you develop severe chest pain, shortness of breath, fever, cough, vomiting, abdominal pain, or any other new/concerning symptoms then return to the ER for evaluation.  Stop the tramadol while you are on the hydrocodone as taking both of these can cause severe side effects and respiratory depression.

## 2018-01-28 ENCOUNTER — Other Ambulatory Visit: Payer: Self-pay | Admitting: Adult Health

## 2018-01-28 ENCOUNTER — Ambulatory Visit
Admission: RE | Admit: 2018-01-28 | Discharge: 2018-01-28 | Disposition: A | Discharge status: Home / Self Care | Payer: Medicare Other | Source: Ambulatory Visit | Attending: Adult Health | Admitting: Adult Health

## 2018-01-28 DIAGNOSIS — N200 Calculus of kidney: Secondary | ICD-10-CM

## 2018-03-08 ENCOUNTER — Emergency Department (HOSPITAL_COMMUNITY)
Admission: EM | Admit: 2018-03-08 | Discharge: 2018-03-08 | Disposition: A | Discharge status: Home / Self Care | Payer: Medicare Other | Attending: Emergency Medicine | Admitting: Emergency Medicine

## 2018-03-08 ENCOUNTER — Emergency Department (HOSPITAL_COMMUNITY): Payer: Medicare Other

## 2018-03-08 ENCOUNTER — Encounter (HOSPITAL_COMMUNITY): Payer: Self-pay

## 2018-03-08 DIAGNOSIS — Z79899 Other long term (current) drug therapy: Secondary | ICD-10-CM | POA: Insufficient documentation

## 2018-03-08 DIAGNOSIS — Z91040 Latex allergy status: Secondary | ICD-10-CM | POA: Insufficient documentation

## 2018-03-08 DIAGNOSIS — I2510 Atherosclerotic heart disease of native coronary artery without angina pectoris: Secondary | ICD-10-CM | POA: Insufficient documentation

## 2018-03-08 DIAGNOSIS — Y92015 Private garage of single-family (private) house as the place of occurrence of the external cause: Secondary | ICD-10-CM | POA: Diagnosis not present

## 2018-03-08 DIAGNOSIS — Z7901 Long term (current) use of anticoagulants: Secondary | ICD-10-CM | POA: Insufficient documentation

## 2018-03-08 DIAGNOSIS — Z853 Personal history of malignant neoplasm of breast: Secondary | ICD-10-CM | POA: Insufficient documentation

## 2018-03-08 DIAGNOSIS — E039 Hypothyroidism, unspecified: Secondary | ICD-10-CM | POA: Diagnosis not present

## 2018-03-08 DIAGNOSIS — Z951 Presence of aortocoronary bypass graft: Secondary | ICD-10-CM | POA: Diagnosis not present

## 2018-03-08 DIAGNOSIS — Z87891 Personal history of nicotine dependence: Secondary | ICD-10-CM | POA: Insufficient documentation

## 2018-03-08 DIAGNOSIS — Z96641 Presence of right artificial hip joint: Secondary | ICD-10-CM | POA: Insufficient documentation

## 2018-03-08 DIAGNOSIS — Z7982 Long term (current) use of aspirin: Secondary | ICD-10-CM | POA: Insufficient documentation

## 2018-03-08 DIAGNOSIS — Y939 Activity, unspecified: Secondary | ICD-10-CM | POA: Insufficient documentation

## 2018-03-08 DIAGNOSIS — E119 Type 2 diabetes mellitus without complications: Secondary | ICD-10-CM | POA: Diagnosis not present

## 2018-03-08 DIAGNOSIS — I10 Essential (primary) hypertension: Secondary | ICD-10-CM | POA: Diagnosis not present

## 2018-03-08 DIAGNOSIS — Y999 Unspecified external cause status: Secondary | ICD-10-CM | POA: Insufficient documentation

## 2018-03-08 DIAGNOSIS — S3992XA Unspecified injury of lower back, initial encounter: Secondary | ICD-10-CM | POA: Diagnosis present

## 2018-03-08 DIAGNOSIS — S300XXA Contusion of lower back and pelvis, initial encounter: Secondary | ICD-10-CM | POA: Diagnosis not present

## 2018-03-08 DIAGNOSIS — W19XXXA Unspecified fall, initial encounter: Secondary | ICD-10-CM | POA: Diagnosis not present

## 2018-03-08 MED ORDER — TRAMADOL HCL 50 MG PO TABS
50.0000 mg | ORAL_TABLET | Freq: Once | ORAL | Status: AC
  Administered 2018-03-08: 50 mg via ORAL
  Filled 2018-03-08: qty 1

## 2018-03-08 MED ORDER — TRAMADOL HCL 50 MG PO TABS: 50.0000 mg | ORAL_TABLET | Freq: Three times a day (TID) | ORAL | 0 refills | Status: DC | PRN

## 2018-03-08 NOTE — ED Triage Notes (Signed)
Patient arrived POV.  -Patient was seen at her doctor. They were not able to do x-ray because patient was in too much pain. -[place] yesterday trying to get out of the car and fell in the garage. Patient hit her bottom and tail bone. Bruise on left bottom -Hx of falls (vertebra is healing from past fall last year)

## 2018-03-08 NOTE — ED Provider Notes (Signed)
[facility] DEPT Provider Note   CSN: [id number] Arrival date & time: 03/08/18  1743     History   Chief Complaint Chief Complaint  Patient presents with  . Fall    HPI Becky Mcmahon is a 84 y.o. female.  HPI   Patient injured her tailbone yesterday when she fell in the garage with her husband.  She was able to get up and ambulate with assistance of family members.  She typically ambulates with a walker.  Today she went to her PCP office to be evaluated for the persistent pain.  While there, attempting  to get an x-ray, being assisted, she fell to the floor.  Family members feel her injury was worse after the second fall.  She has known prior vertebral compression fractures.  No recent illnesses including fever, vomiting, cough, chest pain or shortness of breath.  There are no other known modifying factors.  Past Medical History:  Diagnosis Date  . Anxiety   . Arthritis   . Breast cancer ([place])    Rt mastectomy  . CAD (coronary artery disease)   . Cerebral atherosclerosis    CAROTID DOPPLER, 06/18/2009 - RIGHT/LEFT BULB AND PROXIMAL ICAs-0-49% diameter reduction  . DVT (deep venous thrombosis) (H[facility])    Recurrent, 1st episode after CABG 1997, 2nd episode after hip surgery 2001, placed on life-long coumadin ever since; LEXISCAN, 03/13/2008 - normal, no ECG changes, EKG negative for ischemia  . GERD (gastroesophageal reflux disease)   . Guaiac positive stools   . Hypercholesteremia   . Hyperlipidemia   . Hypertension   . Hypothyroidism   . Osteoporosis   . Ovarian cyst   . Pancreatitis   . 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 Severe aortic stenosis    2D ECHO, 04/22/2012 - EF 60-65%, aortic valve-heavily calcified with restricted leaflet motion, left atrium severly dilated  . Swelling of right lower extremity    LEA  VENOUS DUPLEX SCAN, 08/18/2011 - no evidence of acute thrombus or thrombophlebitis  . Type II diabetes mellitus (HCC)    Diet controlled  . Vitamin D deficiency     Patient Active Problem List   Diagnosis Date Noted  . 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 Examination of participant in clinical trial 02/17/2013  . Breast cancer ([place]) 08/08/2012  . GERD (gastroesophageal reflux disease) 08/08/2012  . Hypothyroidism, unspecified 08/08/2012  . Osteoporosis 08/08/2012  . Aortic valve disorders 05/27/2012  . 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[place]) 05/27/2012  . Arthritis 05/27/2012  . HTN (hypertension) 05/27/2012  . Hyperlipidemia, unspecified 05/27/2012  . DVT (deep venous thrombosis) ([place])   . AS (aortic stenosis) 05/24/2012  . CAD (coronary artery disease) 05/24/2012  . S/P CABG x 5 10/25/1995    Past Surgical History:  Procedure Laterality Date  . AORTIC VALVE REPLACEMENT    . CARDIOLITE MYOCARDIAL PERFUSION STUDY  06/13/2004   Normal static and dynamic myocardial perfusion images with EF of 73%  . CORONARY ARTERY BYPASS GRAFT  10/25/1995   x5 , Dr Prescott Gum  . HIP ARTHROPLASTY     right  . LAPAROTOMY    . LEFT AND RIGHT HEART CATHETERIZATION WITH CORONARY/GRAFT ANGIOGRAM N/A 05/23/2012   Procedure: LEFT AND RIGHT HEART CATHETERIZATION WITH Beatrix Fetters;  Surgeon: Sanda Klein, MD;  Location[facility]  CATH LAB;  Service: Cardiovascular;  Laterality: N/A;  . right mastectomy  1979     OB History   None      Home Medications    Prior to Admission medications   Medication Sig Start Date End Date Taking? Authorizing Provider  Ascorbic Acid (VITAMIN C) 1000 MG tablet Take 1,000 mg by mouth daily.    Yes [provider]  aspirin EC 81 MG tablet Take 81 mg by mouth every evening.    Yes [provider]  atorvastatin (LIPITOR) 20 MG tablet Take 20 mg by mouth every morning.  08/13/14  Yes [provider]  Cholecalciferol  (VITAMIN D) 2000 units tablet Take 2,000 Units by mouth every evening.    Yes [provider]  esomeprazole (NEXIUM) 40 MG capsule Take 40 mg by mouth daily before breakfast.   Yes [provider]  fluticasone (FLONASE) 50 MCG/ACT nasal spray Place 1 spray into both nostrils daily.   Yes [provider]  ibuprofen (ADVIL,MOTRIN) 200 MG tablet Take 200 mg by mouth every 6 (six) hours as needed for moderate pain.   Yes [provider]  levothyroxine (SYNTHROID, LEVOTHROID) 50 MCG tablet Take 50 mcg by mouth daily before breakfast.    Yes [provider]  lisinopril (PRINIVIL,ZESTRIL) 10 MG tablet Take 10 mg by mouth daily.  07/07/16  Yes [provider]  loratadine (CLARITIN) 10 MG tablet Take 10 mg by mouth daily.   Yes [provider]  LORazepam (ATIVAN) 0.5 MG tablet Take 0.25 mg by mouth 2 (two) times daily. Takes at lunchtime and bedtime.   Yes [provider]  Multiple Vitamin (MULTI-VITAMINS) TABS Take 1 tablet by mouth daily.    Yes [provider]  nystatin (NYSTATIN) powder Apply 1 g topically daily. Applies under breasts. 05/12/16  Yes [provider]  warfarin (COUMADIN) 5 MG tablet Take 2.5-5 mg by mouth See admin instructions. Takes 5mg  daily with supper except takes 2.5mg  on Mon, Wed, Fri.   Yes [provider]  docusate sodium (COLACE) 100 MG capsule Take 1 capsule (100 mg total) by mouth every 12 (twelve) hours. Patient not taking: Reported on 03/08/2018 12/27/17   Sherwood Gambler, MD  gabapentin (NEURONTIN) 100 MG capsule Take 1 capsule (100 mg total) by mouth 3 (three) times daily. Patient not taking: Reported on 12/27/2017 11/09/17   Drenda Freeze, MD  HYDROcodone-acetaminophen [facility]) 5-325 MG tablet Take 1 tablet by mouth every 6 (six) hours as needed for severe pain. Patient not taking: Reported on 03/08/2018 12/27/17   Sherwood Gambler, MD  oxyCODONE (ROXICODONE) 5 MG immediate  release tablet Take 0.5-1 tablets (2.5-5 mg total) by mouth every 6 (six) hours as needed for severe pain. Patient not taking: Reported on 12/27/2017 11/05/17   Duffy Bruce, MD  traMADol (ULTRAM) 50 MG tablet Take 1 tablet (50 mg total) by mouth every 8 (eight) hours as needed for moderate pain. 03/08/18   Daleen Bo, MD  valACYclovir (VALTREX) 1000 MG tablet Take 1 tablet (1,000 mg total) by mouth 3 (three) times daily. Patient not taking: Reported on 12/27/2017 11/09/17   Drenda Freeze, MD    Family History Family History  Problem Relation Age of Onset  . Pancreatitis Mother   . Heart disease Father        family HX  . Heart disease Sister   . Heart disease Brother   . Heart disease Brother 47  . Heart disease Sister 84  Social History Social History   Tobacco Use  . Smoking status: Former Smoker    Packs/day: 0.50    Years: 19.00    Pack years: 9.50    Last attempt to quit: 05/24/1969    Years since quitting: 48.8  . Smokeless tobacco: Never Used  Substance Use Topics  . Alcohol use: No  . Drug use: No     Allergies   Contrast media  [iodinated diagnostic agents]; Keflex [cephalexin]; Latex; and Neosporin [neomycin-bacitracin zn-polymyx]   Review of Systems Review of Systems  All other systems reviewed and are negative.    Physical Exam Updated Vital Signs BP 132/75 (BP Location: Left Arm)   Pulse 93   Temp 97.7 F (36.5 C) (Oral)   Resp 16   SpO2 99%   Physical Exam  Constitutional: She is oriented to person, place, and time. She appears well-developed. She appears distressed (Uncomfortable).  Elderly, frail  HENT:  Head: Normocephalic and atraumatic.  Right Ear: External ear normal.  Left Ear: External ear normal.  Eyes: Pupils are equal, round, and reactive to light. Conjunctivae and EOM are normal.  Neck: Normal range of motion and phonation normal. Neck supple.  Cardiovascular: Normal rate.  Pulmonary/Chest: Effort normal. She exhibits  no bony tenderness.  Abdominal: Soft.  Musculoskeletal:  Mild tenderness sacral region without palpable deformity or step-off.  No tenderness of the thoracic or lumbar spine.  Neurological: She is alert and oriented to person, place, and time. No cranial nerve deficit or sensory deficit. She exhibits normal muscle tone. Coordination normal.  Skin: Skin is warm, dry and intact.  Psychiatric: She has a normal mood and affect. Her behavior is normal. Judgment and thought content normal.  Nursing note and vitals reviewed.    ED Treatments / Results  Labs (all labs ordered are listed, but only abnormal results are displayed) Labs Reviewed - No data to display  EKG None  Radiology Dg Thoracic Spine 2 View  Result Date: 03/08/2018 CLINICAL DATA:  Fall. EXAM: THORACIC SPINE 2 VIEWS COMPARISON:  01/04/2018. FINDINGS: Marked diffuse osteopenia. Mild curvature of the lumbar spine is convex to the left. Marked kyphosis of the thoracic spine is noted. Vertebra plana deformity of the T7 vertebra is unchanged from 01/04/2018. Also unchanged is a compression fracture involving L1 with loss of 50% of the vertebral body height. No new compression fractures. A stent graft is identified within the ascending thoracic aorta. Previous median sternotomy CABG procedure. IMPRESSION: 1. No acute findings. 2. Osteopenia 3. Unchanged T7 and L1 compression fractures with associated kyphosis deformity Electronically Signed   By: Kerby Moors M.D.   On: 03/08/2018 19:59   Dg Lumbar Spine 2-3 Views  Result Date: 03/08/2018 CLINICAL DATA:  Status post fall.  Pain in tailbone. EXAM: LUMBAR SPINE - 2-3 VIEW COMPARISON:  01/04/2018. FINDINGS: The bones are markedly osteopenic. Unchanged appearance of compression deformity involving the L1 vertebra. Mild multilevel degenerative disc disease. No acute fractures identified. Aortic atherosclerosis. IMPRESSION: 1. Osteopenia with stable L1 compression fracture. 2.  Aortic  Atherosclerosis (ICD10-I70.0). Electronically Signed   By: Kerby Moors M.D.   On: 03/08/2018 20:02   Dg Sacrum/coccyx  Result Date: 03/08/2018 CLINICAL DATA:  Fall 03/07/2018 with bruising in the coccygeal region. EXAM: SACRUM AND COCCYX - 2+ VIEW COMPARISON:  11/09/2017 FINDINGS: Degenerative change of the right hip. Hardware intact unchanged over the right hip/acetabulum. Old right inferior pubic ramus fracture. No definite sacrococcygeal fracture identified. IMPRESSION: No acute findings. Electronically Signed  By: Marin Olp M.D.   On: 03/08/2018 21:13    Procedures Procedures (including critical care time)  Medications Ordered in ED Medications  traMADol (ULTRAM) tablet 50 mg (50 mg Oral Given 03/08/18 2032)     Initial Impression / Assessment and Plan / ED Course  I have reviewed the triage vital signs and the nursing notes.  Pertinent labs & imaging results that were available during my care of the patient were reviewed by me and considered in my medical decision making (see chart for details).      Patient Vitals for the past 24 hrs:  BP Temp Temp src Pulse Resp SpO2  03/08/18 2035 132/75 97.7 F (36.5 C) Oral 93 16 99 %    9:35 PM Reevaluation with update and discussion. After initial assessment and treatment, an updated evaluation reveals more comfortable now after treatment with tramadol.  Findings discussed with patient and family members, including risk of occult fracture.  They are comfortable with her going home at this time.  All questions answered. Daleen Bo   Medical Decision Making: Fall apparently mechanical, with contusion sacrum.  Possible occult fracture.  No evidence for spinal myelopathy.  Stable chronic compression fractures of thoracic and lumbar spines.  CRITICAL CARE-no Performed by: Daleen Bo  Nursing Notes Reviewed/ Care Coordinated Applicable Imaging Reviewed Interpretation of Laboratory Data incorporated into ED treatment  The  patient appears reasonably screened and/or stabilized for discharge and I doubt any other medical condition or other [facility] requiring further screening, evaluation, or treatment in the ED at this time prior to discharge.  Plan: Home Medications-continue routine medications; Home Treatments-rest, fluids, gradually increase activity; return here if the recommended treatment, does not improve the symptoms; Recommended follow up-PCP, PRN   Final Clinical Impressions(s) / ED Diagnoses   Final diagnoses:  Contusion of sacrum, initial encounter    ED Discharge Orders         Ordered    traMADol (ULTRAM) 50 MG tablet  Every 8 hours PRN     03/08/18 2140           Daleen Bo, MD 03/08/18 2141

## 2018-03-08 NOTE — Discharge Instructions (Addendum)
Sit on something soft, to decrease your pain.  Gradually increase your activity.

## 2018-07-04 ENCOUNTER — Other Ambulatory Visit: Payer: Self-pay | Admitting: Podiatry

## 2018-07-04 ENCOUNTER — Ambulatory Visit (INDEPENDENT_AMBULATORY_CARE_PROVIDER_SITE_OTHER): Payer: Medicare Other

## 2018-07-04 ENCOUNTER — Ambulatory Visit: Payer: Medicare Other | Admitting: Podiatry

## 2018-07-04 DIAGNOSIS — L03031 Cellulitis of right toe: Secondary | ICD-10-CM

## 2018-07-04 DIAGNOSIS — L97522 Non-pressure chronic ulcer of other part of left foot with fat layer exposed: Secondary | ICD-10-CM

## 2018-07-04 DIAGNOSIS — M79671 Pain in right foot: Secondary | ICD-10-CM

## 2018-07-04 DIAGNOSIS — L97529 Non-pressure chronic ulcer of other part of left foot with unspecified severity: Secondary | ICD-10-CM | POA: Diagnosis not present

## 2018-07-04 DIAGNOSIS — M79672 Pain in left foot: Secondary | ICD-10-CM

## 2018-07-04 DIAGNOSIS — I83025 Varicose veins of left lower extremity with ulcer other part of foot: Secondary | ICD-10-CM | POA: Diagnosis not present

## 2018-07-04 DIAGNOSIS — L03039 Cellulitis of unspecified toe: Secondary | ICD-10-CM

## 2018-07-04 MED ORDER — DOXYCYCLINE HYCLATE 100 MG PO TABS: 100.0000 mg | ORAL_TABLET | Freq: Two times a day (BID) | ORAL | 0 refills | Status: DC

## 2018-07-04 NOTE — Progress Notes (Signed)
HPI: 85 year old established patient presents the office today for new complaints regarding an ulceration to the dorsal aspect of the left foot.  Patient also complains of an infection to the right great toe.  Patient was last seen on the office on 08/23/2016.  At that time ingrown toenail procedure was performed to the left hallux.  Healed uneventfully.  Patient states that over the past 3-4 weeks she has been having a painful lesion on the top of her left foot as well as pain in her right great toe.  Patient recently sustained a vertebral fracture secondary to a fall injury.  She presents for further treatment evaluation  Past Medical History:  Diagnosis Date  . Anxiety   . Arthritis   . Breast cancer ([place])    Rt mastectomy  . CAD (coronary artery disease)   . Cerebral atherosclerosis    CAROTID DOPPLER, 06/18/2009 - RIGHT/LEFT BULB AND PROXIMAL ICAs-0-49% diameter reduction  . DVT (deep venous thrombosis) (HCC)    Recurrent, 1st episode after CABG 1997, 2nd episode after hip surgery 2001, placed on life-long coumadin ever since; LEXISCAN, 03/13/2008 - normal, no ECG changes, EKG negative for ischemia  . GERD (gastroesophageal reflux disease)   . Guaiac positive stools   . Hypercholesteremia   . Hyperlipidemia   . Hypertension   . Hypothyroidism   . Osteoporosis   . Ovarian cyst   . Pancreatitis   . 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 Severe aortic stenosis    2D ECHO, 04/22/2012 - EF 60-65%, aortic valve-heavily calcified with restricted leaflet motion, left atrium severly dilated  . Swelling of right lower extremity    LEA VENOUS DUPLEX SCAN, 08/18/2011 - no evidence of acute thrombus or thrombophlebitis  . Type II diabetes mellitus (HCC)    Diet controlled  . Vitamin D deficiency      Physical Exam: General: The patient is alert and oriented x3 in no acute  distress.  Dermatology: Skin is warm, dry and supple bilateral lower extremities.  Light pressure to the nail plate of the right hallux caused purulent drainage to be expressed from underlying the nail plate.  No malodor noted.  Periungual borders are edematous with drainage noted.  Skin breakdown noted to the medial border of the right hallux just proximal to the nail plate.  Ulcer also noted to the left dorsal foot measuring approximately 1.0 x 1.0 x 0.1 centimeters.  To the noted ulceration there is not appear to be any purulent drainage.  No malodor noted.  Wound base is granular.  There is no exposed bone muscle tendon ligament or joint.  Vascular: Palpable pedal pulses bilaterally. Capillary refill within normal limits.  Varicosities noted bilateral lower extremities  Neurological: Epicritic and protective threshold grossly intact bilaterally.   Musculoskeletal Exam: Range of motion within normal limits to all pedal and ankle joints bilateral. Muscle strength 5/5 in all groups bilateral.   Radiographic Exam:  Normal osseous mineralization.  Moderate DJD noted diffusely throughout the pedal joints of the foot.. No fracture/dislocation/boney destruction.    Assessment: 1.  Right great toe nail plate infection 2.  Cellulitis right hallux 3.  Ulcer left dorsal foot secondary to venous insufficiency 4.  Varicosities bilateral lower extremities   Plan of Care:  1. Patient evaluated. X-Rays reviewed.  2.  Medically necessary  excisional debridement including subcutaneous tissue was performed to the left dorsal foot using a tissue nipper.  Excisional debridement of all necrotic nonviable tissue down to healthy bleeding viable tissue was performed with post debridement measurement same as pre-.  Dry sterile dressing applied 3.  Total temporary nail avulsion was performed to the right hallux nail plate.  Prior to procedure 3 cc of 2% lidocaine plain was utilized in a digital block fashion.  Toe  was prepped in aseptic manner and consent obtained.  Nail was avulsed and dry sterile dressing was applied.   4.  Recommend Betadine and dry sterile dressing daily 5.  Prescription for doxycycline 100 mg #20 twice daily 6.  Return to clinic in 3 weeks       Edrick Kins, DPM [facility]  Dr. Edrick Kins, DPM    [address], [place] [address]                Office [phone]  Fax [phone]

## 2018-07-07 ENCOUNTER — Emergency Department (HOSPITAL_COMMUNITY)
Admission: EM | Admit: 2018-07-07 | Discharge: 2018-07-07 | Disposition: A | Discharge status: Home / Self Care | Payer: Medicare Other | Source: Home / Self Care | Attending: Emergency Medicine | Admitting: Emergency Medicine

## 2018-07-07 ENCOUNTER — Emergency Department (HOSPITAL_COMMUNITY): Payer: Medicare Other

## 2018-07-07 ENCOUNTER — Encounter (HOSPITAL_COMMUNITY): Payer: Self-pay

## 2018-07-07 DIAGNOSIS — E039 Hypothyroidism, unspecified: Secondary | ICD-10-CM | POA: Insufficient documentation

## 2018-07-07 DIAGNOSIS — A419 Sepsis, unspecified organism: Secondary | ICD-10-CM | POA: Diagnosis not present

## 2018-07-07 DIAGNOSIS — Z7982 Long term (current) use of aspirin: Secondary | ICD-10-CM | POA: Insufficient documentation

## 2018-07-07 DIAGNOSIS — M545 Low back pain, unspecified: Secondary | ICD-10-CM

## 2018-07-07 DIAGNOSIS — I10 Essential (primary) hypertension: Secondary | ICD-10-CM | POA: Insufficient documentation

## 2018-07-07 DIAGNOSIS — Z91040 Latex allergy status: Secondary | ICD-10-CM

## 2018-07-07 DIAGNOSIS — D72829 Elevated white blood cell count, unspecified: Secondary | ICD-10-CM | POA: Insufficient documentation

## 2018-07-07 DIAGNOSIS — G8929 Other chronic pain: Secondary | ICD-10-CM

## 2018-07-07 DIAGNOSIS — Z87891 Personal history of nicotine dependence: Secondary | ICD-10-CM

## 2018-07-07 DIAGNOSIS — Z9011 Acquired absence of right breast and nipple: Secondary | ICD-10-CM | POA: Insufficient documentation

## 2018-07-07 DIAGNOSIS — Z853 Personal history of malignant neoplasm of breast: Secondary | ICD-10-CM | POA: Insufficient documentation

## 2018-07-07 DIAGNOSIS — R092 Respiratory arrest: Secondary | ICD-10-CM | POA: Diagnosis not present

## 2018-07-07 DIAGNOSIS — I2510 Atherosclerotic heart disease of native coronary artery without angina pectoris: Secondary | ICD-10-CM

## 2018-07-07 DIAGNOSIS — R1010 Upper abdominal pain, unspecified: Secondary | ICD-10-CM

## 2018-07-07 DIAGNOSIS — Z7901 Long term (current) use of anticoagulants: Secondary | ICD-10-CM

## 2018-07-07 DIAGNOSIS — Z79899 Other long term (current) drug therapy: Secondary | ICD-10-CM | POA: Insufficient documentation

## 2018-07-07 LAB — URINALYSIS, ROUTINE W REFLEX MICROSCOPIC
Bacteria, UA: NONE SEEN
Bilirubin Urine: NEGATIVE
GLUCOSE, UA: NEGATIVE mg/dL
Hgb urine dipstick: NEGATIVE
Ketones, ur: NEGATIVE mg/dL
Nitrite: NEGATIVE
PROTEIN: NEGATIVE mg/dL
SPECIFIC GRAVITY, URINE: 1.024 (ref 1.005–1.030)
pH: 5 (ref 5.0–8.0)

## 2018-07-07 LAB — COMPREHENSIVE METABOLIC PANEL
ALBUMIN: 3.5 g/dL (ref 3.5–5.0)
ALT: 7 U/L (ref 0–44)
AST: 36 U/L (ref 15–41)
Alkaline Phosphatase: 86 U/L (ref 38–126)
Anion gap: 10 (ref 5–15)
BILIRUBIN TOTAL: 1.5 mg/dL — AB (ref 0.3–1.2)
BUN: 51 mg/dL — AB (ref 8–23)
CO2: 25 mmol/L (ref 22–32)
CREATININE: 1.35 mg/dL — AB (ref 0.44–1.00)
Calcium: 9.5 mg/dL (ref 8.9–10.3)
Chloride: 108 mmol/L (ref 98–111)
GFR calc Af Amer: 41 mL/min — ABNORMAL LOW (ref >60)
GFR, EST NON AFRICAN AMERICAN: 36 mL/min — AB (ref >60)
GLUCOSE: 168 mg/dL — AB (ref 70–99)
POTASSIUM: 5 mmol/L (ref 3.5–5.1)
Sodium: 143 mmol/L (ref 135–145)
TOTAL PROTEIN: 7.4 g/dL (ref 6.5–8.1)

## 2018-07-07 LAB — CBC
HEMATOCRIT: 38.5 % (ref 36.0–46.0)
HEMOGLOBIN: 11.6 g/dL — AB (ref 12.0–15.0)
MCH: 24.8 pg — ABNORMAL LOW (ref 26.0–34.0)
MCHC: 30.1 g/dL (ref 30.0–36.0)
MCV: 82.3 fL (ref 80.0–100.0)
NRBC: 0 % (ref 0.0–0.2)
Platelets: 299 10*3/uL (ref 150–400)
RBC: 4.68 MIL/uL (ref 3.87–5.11)
RDW: 15.4 % (ref 11.5–15.5)
WBC: 19.1 10*3/uL — ABNORMAL HIGH (ref 4.0–10.5)

## 2018-07-07 LAB — LIPASE, BLOOD: LIPASE: 50 U/L (ref 11–51)

## 2018-07-07 NOTE — ED Provider Notes (Signed)
Patient's work-up for the chronic back pain and without upper abdomen abdominal pain and the elevated white blood cell count without any significant findings.  CT scan abdomen without acute findings.  Patient has a persistent elevated white blood cell count at 19,000 that will require follow-up with her primary care doctor and recheck in the next week or 2.  Urinalysis was negative for urinary tract infection.  Culture sent just for confirmation.   Fredia Sorrow, MD 07/07/18 [phone]

## 2018-07-07 NOTE — ED Notes (Signed)
Patient transported to CT 

## 2018-07-07 NOTE — Discharge Instructions (Addendum)
Make an appointment to follow-up with your regular doctor sometime in the next week or 2.  To have your white blood cell count rechecked.  Work-up for the abdominal pain and the elevated white blood cell count here today without any acute infection findings.  Had CT abdomen chest x-ray urinalysis.  Return for any new or worse symptoms.

## 2018-07-07 NOTE — ED Notes (Signed)
URINE CULTURE SENT WITH URINE SAMPLE.  

## 2018-07-07 NOTE — ED Notes (Signed)
Patient transported to X-ray 

## 2018-07-07 NOTE — ED Notes (Signed)
Placed pt on Purewick at this time to collect urine sample. If pt unable to void, will perform in and out.

## 2018-07-07 NOTE — ED Triage Notes (Signed)
Pt presents with c/o back pain. Family at bedside reports that she has had back pain for the past 7 months but it has gotten worse recently. Pt not able to get in for an MRI soon.

## 2018-07-07 NOTE — ED Provider Notes (Signed)
[facility] DEPT Provider Note   CSN: [id number] Arrival date & time: 07/07/18  1354    History   Chief Complaint Chief Complaint  Patient presents with  . Back Pain    HPI Becky Mcmahon is a 85 y.o. female.   HPI History was provided by the patient and her family.  Patient has been having trouble with persistent back pain for approximately 7 months.  Patient has a history of known vertebral compression fractures.  She has been treated for this by her primary care doctor.  Patient followed up with her doctor recently and mention persistent pain that she was having.  They felt that this was longer than expected so they have ordered an outpatient MRI.  Patient is still waiting to get that test done.  In the past week she has been having increasing pain.  Family states that the patient will often hold the front of her abdomen but her pain is primarily in her mid back.  She denies any nausea vomiting.  She has not had urinary symptoms.  Denies any chest pain or shortness of breath.  Pain increases with any movement or to VD. Past Medical History:  Diagnosis Date  . Anxiety   . Arthritis   . Breast cancer ([place])    Rt mastectomy  . CAD (coronary artery disease)   . Cerebral atherosclerosis    CAROTID DOPPLER, 06/18/2009 - RIGHT/LEFT BULB AND PROXIMAL ICAs-0-49% diameter reduction  . DVT (deep venous thrombosis) (HCC)    Recurrent, 1st episode after CABG 1997, 2nd episode after hip surgery 2001, placed on life-long coumadin ever since; LEXISCAN, 03/13/2008 - normal, no ECG changes, EKG negative for ischemia  . GERD (gastroesophageal reflux disease)   . Guaiac positive stools   . Hypercholesteremia   . Hyperlipidemia   . Hypertension   . Hypothyroidism   . Osteoporosis   . Ovarian cyst   . Pancreatitis   . S/P CABG x 5 10/25/1995   LIMA to LAD, sequential SVG to RI and OM, sequential SVG to RCA and PDA, open vein harvest from both thighs and legs -  Dr Prescott Gum  . Severe aortic stenosis    2D ECHO, 04/22/2012 - EF 60-65%, aortic valve-heavily calcified with restricted leaflet motion, left atrium severly dilated  . Swelling of right lower extremity    LEA VENOUS DUPLEX SCAN, 08/18/2011 - no evidence of acute thrombus or thrombophlebitis  . Type II diabetes mellitus (HCC)    Diet controlled  . Vitamin D deficiency     Patient Active Problem List   Diagnosis Date Noted  . 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 Examination of participant in clinical trial 02/17/2013  . Breast cancer ([place]) 08/08/2012  . GERD (gastroesophageal reflux disease) 08/08/2012  . Hypothyroidism, unspecified 08/08/2012  . Osteoporosis 08/08/2012  . Aortic valve disorders 05/27/2012  . 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[place]) 05/27/2012  . Arthritis 05/27/2012  . HTN (hypertension) 05/27/2012  . Hyperlipidemia, unspecified 05/27/2012  . DVT (deep venous thrombosis) ([place])   . AS (aortic stenosis) 05/24/2012  . CAD (coronary artery disease) 05/24/2012  . S/P CABG x 5 10/25/1995    Past Surgical History:  Procedure Laterality Date  . AORTIC VALVE REPLACEMENT    . CARDIOLITE MYOCARDIAL PERFUSION STUDY  06/13/2004   Normal static and dynamic myocardial perfusion images with EF of 73%  . CORONARY ARTERY BYPASS GRAFT  10/25/1995   x5 , Dr Prescott Gum  . HIP  ARTHROPLASTY     right  . LAPAROTOMY    . LEFT AND RIGHT HEART CATHETERIZATION WITH CORONARY/GRAFT ANGIOGRAM N/A 05/23/2012   Procedure: LEFT AND RIGHT HEART CATHETERIZATION WITH Beatrix Fetters;  Surgeon: Sanda Klein, MD;  Location: [facility] CATH LAB;  Service: Cardiovascular;  Laterality: N/A;  . right mastectomy  1979     OB History   No obstetric history on file.      Home Medications    Prior to Admission medications   Medication Sig Start Date End Date Taking? Authorizing Provider  Ascorbic Acid (VITAMIN C) 1000 MG tablet Take 1,000 mg by mouth daily.     [provider]  aspirin EC 81 MG tablet Take 81 mg by mouth every evening.     [provider]  atorvastatin (LIPITOR) 20 MG tablet Take 20 mg by mouth every morning.  08/13/14   [provider]  Cholecalciferol (VITAMIN D) 2000 units tablet Take 2,000 Units by mouth every evening.     [provider]  docusate sodium (COLACE) 100 MG capsule Take 1 capsule (100 mg total) by mouth every 12 (twelve) hours. 12/27/17   Sherwood Gambler, MD  donepezil (ARICEPT) 5 MG tablet TK 1 T PO QPM WF 06/28/18   [provider]  doxycycline (VIBRA-TABS) 100 MG tablet Take 1 tablet (100 mg total) by mouth 2 (two) times daily. 07/04/18   Edrick Kins, DPM  esomeprazole (NEXIUM) 40 MG capsule Take 40 mg by mouth daily before breakfast.    [provider]  fluticasone (FLONASE) 50 MCG/ACT nasal spray Place 1 spray into both nostrils daily.    [provider]  gabapentin (NEURONTIN) 100 MG capsule Take 1 capsule (100 mg total) by mouth 3 (three) times daily. 11/09/17   Drenda Freeze, MD  HYDROcodone-acetaminophen (NORCO) 5-325 MG tablet Take 1 tablet by mouth every 6 (six) hours as needed for severe pain. 12/27/17   Sherwood Gambler, MD  ibuprofen (ADVIL,MOTRIN) 200 MG tablet Take 200 mg by mouth every 6 (six) hours as needed for moderate pain.    [provider]  levothyroxine (SYNTHROID, LEVOTHROID) 50 MCG tablet Take 50 mcg by mouth daily before breakfast.     [provider]  lisinopril (PRINIVIL,ZESTRIL) 10 MG tablet Take 10 mg by mouth daily.  07/07/16   [provider]  loratadine (CLARITIN) 10 MG tablet Take 10 mg by mouth daily.    [provider]  LORazepam (ATIVAN) 0.5 MG tablet Take 0.25 mg by mouth 2 (two) times daily. Takes at lunchtime and bedtime.    [provider]  mirtazapine (REMERON) 7.5 MG tablet TAKE 1 TABLET BY MOUTH DAILY AT BEDTIME FOR APPETITE AND SLEEP 06/28/18   [provider]  Multiple  Vitamin (MULTI-VITAMINS) TABS Take 1 tablet by mouth daily.     [provider]  Multiple Vitamins-Minerals (MULTIVITAMIN PO) Take by mouth.    [provider]  nystatin (NYSTATIN) powder Apply 1 g topically daily. Applies under breasts. 05/12/16   [provider]  oxyCODONE (ROXICODONE) 5 MG immediate release tablet Take 0.5-1 tablets (2.5-5 mg total) by mouth every 6 (six) hours as needed for severe pain. 11/05/17   Duffy Bruce, MD  traMADol (ULTRAM) 50 MG tablet Take 1 tablet (50 mg total) by mouth every 8 (eight) hours as needed for moderate pain. 03/08/18   Daleen Bo, MD  valACYclovir (VALTREX) 1000 MG tablet Take 1 tablet (1,000 mg total) by mouth 3 (three) times daily.  11/09/17   Drenda Freeze, MD  warfarin (COUMADIN) 5 MG tablet Take 2.5-5 mg by mouth See admin instructions. Takes 5mg  daily with supper except takes 2.5mg  on Mon, Wed, Fri.    [provider]    Family History Family History  Problem Relation Age of Onset  . Pancreatitis Mother   . Heart disease Father        family HX  . Heart disease Sister   . Heart disease Brother   . Heart disease Brother 38  . Heart disease Sister 39    Social History Social History   Tobacco Use  . Smoking status: Former Smoker    Packs/day: 0.50    Years: 19.00    Pack years: 9.50    Last attempt to quit: 05/24/1969    Years since quitting: 49.1  . Smokeless tobacco: Never Used  Substance Use Topics  . Alcohol use: No  . Drug use: No     Allergies   Contrast media  [iodinated diagnostic agents]; Keflex [cephalexin]; Latex; and Neosporin [neomycin-bacitracin zn-polymyx]   Review of Systems Review of Systems  All other systems reviewed and are negative.    Physical Exam Updated Vital Signs BP (!) 97/52 (BP Location: Left Arm)   Pulse (!) 114   Resp 17   SpO2 95%   Physical Exam Vitals signs and nursing note reviewed.  Constitutional:      General: She is not in acute  distress.    Appearance: She is well-developed. She is not ill-appearing.     Comments: Elderly, frail  HENT:     Head: Normocephalic and atraumatic.     Right Ear: External ear normal.     Left Ear: External ear normal.  Eyes:     General: No scleral icterus.       Right eye: No discharge.        Left eye: No discharge.     Conjunctiva/sclera: Conjunctivae normal.  Neck:     Musculoskeletal: Neck supple.     Trachea: No tracheal deviation.  Cardiovascular:     Rate and Rhythm: Normal rate and regular rhythm.  Pulmonary:     Effort: Pulmonary effort is normal. No respiratory distress.     Breath sounds: Normal breath sounds. No stridor. No wheezing or rales.  Abdominal:     General: Bowel sounds are normal. There is no distension.     Palpations: Abdomen is soft.     Tenderness: There is no abdominal tenderness. There is no guarding or rebound.  Musculoskeletal:        General: No tenderness.     Comments: Extreme kyphosis of her thoracic spine, no discrete areas of tenderness in the cervical,  thoracic or lumbar spine  Skin:    General: Skin is warm and dry.     Findings: No rash.  Neurological:     Cranial Nerves: No cranial nerve deficit (no facial droop, extraocular movements intact, no slurred speech).     Sensory: No sensory deficit.     Motor: No abnormal muscle tone or seizure activity.     Coordination: Coordination normal.      ED Treatments / Results  Labs (all labs ordered are listed, but only abnormal results are displayed) Labs Reviewed  CBC  COMPREHENSIVE METABOLIC PANEL  LIPASE, BLOOD  URINALYSIS, ROUTINE W REFLEX MICROSCOPIC     Procedures Procedures (including critical care time)  Medications Ordered in ED Medications - No data to display   Initial  Impression / Assessment and Plan / ED Course  I have reviewed the triage vital signs and the nursing notes.  Pertinent labs & imaging results that were available during my care of the patient  were reviewed by me and considered in my medical decision making (see chart for details).  Clinical Course as of Jul 09 2214  Thu Jul 07, 2018  1607 Significant leukocytosis noted.  Initial lipase and LFTs are normal.   [JK]  1608 UN and creatinine are chronically elevated.  CT scan without acute findings.  Urinalysis is pending.   [JK]    Clinical Course User Index [JK] Dorie Rank, MD     Pt presented to the ED with complaints of chronic back pain.  Pt also was complaining of pain in her upper abdomen although no ttp on exam.  Labs notable for elevated WBC.  CT abd without acute findings.  CXR negative. UA pending.   Care turned over to Dr Rogene Houston at end of shift.  Final Clinical Impressions(s) / ED Diagnoses   Final diagnoses:  Leukocytosis, unspecified type  Pain of upper abdomen  Chronic midline low back pain without sciatica      Dorie Rank, MD 07/09/18 2218

## 2018-07-08 ENCOUNTER — Emergency Department (HOSPITAL_COMMUNITY): Payer: Medicare Other

## 2018-07-08 ENCOUNTER — Encounter (HOSPITAL_COMMUNITY): Payer: Self-pay | Admitting: Emergency Medicine

## 2018-07-08 ENCOUNTER — Inpatient Hospital Stay (HOSPITAL_COMMUNITY)
Admission: EM | Admit: 2018-07-08 | Discharge: 2018-07-20 | DRG: 871 | Disposition: A | Discharge status: Skilled Nursing Facility | Payer: Medicare Other | Attending: Internal Medicine | Admitting: Internal Medicine

## 2018-07-08 DIAGNOSIS — L8990 Pressure ulcer of unspecified site, unspecified stage: Secondary | ICD-10-CM

## 2018-07-08 DIAGNOSIS — G8929 Other chronic pain: Secondary | ICD-10-CM | POA: Diagnosis present

## 2018-07-08 DIAGNOSIS — Z7189 Other specified counseling: Secondary | ICD-10-CM | POA: Diagnosis not present

## 2018-07-08 DIAGNOSIS — E039 Hypothyroidism, unspecified: Secondary | ICD-10-CM | POA: Diagnosis present

## 2018-07-08 DIAGNOSIS — J90 Pleural effusion, not elsewhere classified: Secondary | ICD-10-CM | POA: Diagnosis not present

## 2018-07-08 DIAGNOSIS — I361 Nonrheumatic tricuspid (valve) insufficiency: Secondary | ICD-10-CM | POA: Diagnosis not present

## 2018-07-08 DIAGNOSIS — R0602 Shortness of breath: Secondary | ICD-10-CM | POA: Diagnosis not present

## 2018-07-08 DIAGNOSIS — Z91041 Radiographic dye allergy status: Secondary | ICD-10-CM

## 2018-07-08 DIAGNOSIS — R911 Solitary pulmonary nodule: Secondary | ICD-10-CM | POA: Diagnosis present

## 2018-07-08 DIAGNOSIS — Z96641 Presence of right artificial hip joint: Secondary | ICD-10-CM | POA: Diagnosis present

## 2018-07-08 DIAGNOSIS — E876 Hypokalemia: Secondary | ICD-10-CM | POA: Diagnosis not present

## 2018-07-08 DIAGNOSIS — Z7901 Long term (current) use of anticoagulants: Secondary | ICD-10-CM

## 2018-07-08 DIAGNOSIS — L89892 Pressure ulcer of other site, stage 2: Secondary | ICD-10-CM | POA: Diagnosis present

## 2018-07-08 DIAGNOSIS — E872 Acidosis, unspecified: Secondary | ICD-10-CM

## 2018-07-08 DIAGNOSIS — I10 Essential (primary) hypertension: Secondary | ICD-10-CM | POA: Diagnosis present

## 2018-07-08 DIAGNOSIS — Z951 Presence of aortocoronary bypass graft: Secondary | ICD-10-CM

## 2018-07-08 DIAGNOSIS — G9341 Metabolic encephalopathy: Secondary | ICD-10-CM | POA: Diagnosis present

## 2018-07-08 DIAGNOSIS — E559 Vitamin D deficiency, unspecified: Secondary | ICD-10-CM | POA: Diagnosis present

## 2018-07-08 DIAGNOSIS — Z66 Do not resuscitate: Secondary | ICD-10-CM | POA: Diagnosis present

## 2018-07-08 DIAGNOSIS — M546 Pain in thoracic spine: Secondary | ICD-10-CM

## 2018-07-08 DIAGNOSIS — E7800 Pure hypercholesterolemia, unspecified: Secondary | ICD-10-CM | POA: Diagnosis present

## 2018-07-08 DIAGNOSIS — Z881 Allergy status to other antibiotic agents status: Secondary | ICD-10-CM

## 2018-07-08 DIAGNOSIS — E860 Dehydration: Secondary | ICD-10-CM | POA: Diagnosis present

## 2018-07-08 DIAGNOSIS — R6521 Severe sepsis with septic shock: Secondary | ICD-10-CM | POA: Diagnosis present

## 2018-07-08 DIAGNOSIS — Z8249 Family history of ischemic heart disease and other diseases of the circulatory system: Secondary | ICD-10-CM

## 2018-07-08 DIAGNOSIS — I700 Atherosclerosis of aorta: Secondary | ICD-10-CM | POA: Diagnosis present

## 2018-07-08 DIAGNOSIS — I672 Cerebral atherosclerosis: Secondary | ICD-10-CM | POA: Diagnosis present

## 2018-07-08 DIAGNOSIS — R579 Shock, unspecified: Secondary | ICD-10-CM

## 2018-07-08 DIAGNOSIS — A419 Sepsis, unspecified organism: Principal | ICD-10-CM | POA: Diagnosis present

## 2018-07-08 DIAGNOSIS — M4854XA Collapsed vertebra, not elsewhere classified, thoracic region, initial encounter for fracture: Secondary | ICD-10-CM | POA: Diagnosis present

## 2018-07-08 DIAGNOSIS — R791 Abnormal coagulation profile: Secondary | ICD-10-CM | POA: Diagnosis not present

## 2018-07-08 DIAGNOSIS — S22009A Unspecified fracture of unspecified thoracic vertebra, initial encounter for closed fracture: Secondary | ICD-10-CM | POA: Clinically undetermined

## 2018-07-08 DIAGNOSIS — E1165 Type 2 diabetes mellitus with hyperglycemia: Secondary | ICD-10-CM | POA: Diagnosis present

## 2018-07-08 DIAGNOSIS — N179 Acute kidney failure, unspecified: Secondary | ICD-10-CM | POA: Diagnosis not present

## 2018-07-08 DIAGNOSIS — Z888 Allergy status to other drugs, medicaments and biological substances status: Secondary | ICD-10-CM

## 2018-07-08 DIAGNOSIS — Z7982 Long term (current) use of aspirin: Secondary | ICD-10-CM

## 2018-07-08 DIAGNOSIS — Z9011 Acquired absence of right breast and nipple: Secondary | ICD-10-CM

## 2018-07-08 DIAGNOSIS — R578 Other shock: Secondary | ICD-10-CM | POA: Diagnosis present

## 2018-07-08 DIAGNOSIS — D62 Acute posthemorrhagic anemia: Secondary | ICD-10-CM | POA: Diagnosis not present

## 2018-07-08 DIAGNOSIS — F419 Anxiety disorder, unspecified: Secondary | ICD-10-CM | POA: Diagnosis present

## 2018-07-08 DIAGNOSIS — Z515 Encounter for palliative care: Secondary | ICD-10-CM | POA: Diagnosis not present

## 2018-07-08 DIAGNOSIS — M40204 Unspecified kyphosis, thoracic region: Secondary | ICD-10-CM | POA: Diagnosis present

## 2018-07-08 DIAGNOSIS — Z952 Presence of prosthetic heart valve: Secondary | ICD-10-CM

## 2018-07-08 DIAGNOSIS — R799 Abnormal finding of blood chemistry, unspecified: Secondary | ICD-10-CM

## 2018-07-08 DIAGNOSIS — J9601 Acute respiratory failure with hypoxia: Secondary | ICD-10-CM | POA: Diagnosis present

## 2018-07-08 DIAGNOSIS — Z79899 Other long term (current) drug therapy: Secondary | ICD-10-CM

## 2018-07-08 DIAGNOSIS — I350 Nonrheumatic aortic (valve) stenosis: Secondary | ICD-10-CM | POA: Diagnosis not present

## 2018-07-08 DIAGNOSIS — E785 Hyperlipidemia, unspecified: Secondary | ICD-10-CM | POA: Diagnosis present

## 2018-07-08 DIAGNOSIS — I2510 Atherosclerotic heart disease of native coronary artery without angina pectoris: Secondary | ICD-10-CM | POA: Diagnosis present

## 2018-07-08 DIAGNOSIS — K219 Gastro-esophageal reflux disease without esophagitis: Secondary | ICD-10-CM | POA: Diagnosis present

## 2018-07-08 DIAGNOSIS — E861 Hypovolemia: Secondary | ICD-10-CM | POA: Diagnosis not present

## 2018-07-08 DIAGNOSIS — K449 Diaphragmatic hernia without obstruction or gangrene: Secondary | ICD-10-CM | POA: Diagnosis present

## 2018-07-08 DIAGNOSIS — Z853 Personal history of malignant neoplasm of breast: Secondary | ICD-10-CM

## 2018-07-08 DIAGNOSIS — Z7951 Long term (current) use of inhaled steroids: Secondary | ICD-10-CM

## 2018-07-08 DIAGNOSIS — K921 Melena: Secondary | ICD-10-CM | POA: Diagnosis present

## 2018-07-08 DIAGNOSIS — Z86718 Personal history of other venous thrombosis and embolism: Secondary | ICD-10-CM

## 2018-07-08 DIAGNOSIS — S22079A Unspecified fracture of T9-T10 vertebra, initial encounter for closed fracture: Secondary | ICD-10-CM | POA: Diagnosis not present

## 2018-07-08 DIAGNOSIS — D689 Coagulation defect, unspecified: Secondary | ICD-10-CM | POA: Diagnosis present

## 2018-07-08 DIAGNOSIS — I82409 Acute embolism and thrombosis of unspecified deep veins of unspecified lower extremity: Secondary | ICD-10-CM | POA: Diagnosis present

## 2018-07-08 DIAGNOSIS — L89156 Pressure-induced deep tissue damage of sacral region: Secondary | ICD-10-CM | POA: Diagnosis present

## 2018-07-08 DIAGNOSIS — K922 Gastrointestinal hemorrhage, unspecified: Secondary | ICD-10-CM | POA: Diagnosis not present

## 2018-07-08 DIAGNOSIS — R195 Other fecal abnormalities: Secondary | ICD-10-CM | POA: Diagnosis not present

## 2018-07-08 DIAGNOSIS — G9340 Encephalopathy, unspecified: Secondary | ICD-10-CM | POA: Diagnosis not present

## 2018-07-08 DIAGNOSIS — E870 Hyperosmolality and hypernatremia: Secondary | ICD-10-CM | POA: Diagnosis not present

## 2018-07-08 DIAGNOSIS — Z87891 Personal history of nicotine dependence: Secondary | ICD-10-CM

## 2018-07-08 DIAGNOSIS — R54 Age-related physical debility: Secondary | ICD-10-CM | POA: Diagnosis present

## 2018-07-08 DIAGNOSIS — Z791 Long term (current) use of non-steroidal anti-inflammatories (NSAID): Secondary | ICD-10-CM

## 2018-07-08 DIAGNOSIS — Z79890 Hormone replacement therapy (postmenopausal): Secondary | ICD-10-CM

## 2018-07-08 DIAGNOSIS — H9190 Unspecified hearing loss, unspecified ear: Secondary | ICD-10-CM | POA: Diagnosis present

## 2018-07-08 DIAGNOSIS — Z91040 Latex allergy status: Secondary | ICD-10-CM

## 2018-07-08 DIAGNOSIS — R092 Respiratory arrest: Secondary | ICD-10-CM | POA: Diagnosis present

## 2018-07-08 DIAGNOSIS — S22078A Other fracture of T9-T10 vertebra, initial encounter for closed fracture: Secondary | ICD-10-CM | POA: Diagnosis not present

## 2018-07-08 LAB — CBC WITH DIFFERENTIAL/PLATELET
Abs Immature Granulocytes: 0.33 10*3/uL — ABNORMAL HIGH (ref 0.00–0.07)
BASOS PCT: 1 %
Basophils Absolute: 0.1 10*3/uL (ref 0.0–0.1)
Eosinophils Absolute: 0.1 10*3/uL (ref 0.0–0.5)
Eosinophils Relative: 1 %
HCT: 32.9 % — ABNORMAL LOW (ref 36.0–46.0)
Hemoglobin: 9.3 g/dL — ABNORMAL LOW (ref 12.0–15.0)
Immature Granulocytes: 3 %
Lymphocytes Relative: 20 %
Lymphs Abs: 2.6 10*3/uL (ref 0.7–4.0)
MCH: 24.1 pg — ABNORMAL LOW (ref 26.0–34.0)
MCHC: 28.3 g/dL — ABNORMAL LOW (ref 30.0–36.0)
MCV: 85.2 fL (ref 80.0–100.0)
Monocytes Absolute: 0.5 10*3/uL (ref 0.1–1.0)
Monocytes Relative: 3 %
Neutro Abs: 9.7 10*3/uL — ABNORMAL HIGH (ref 1.7–7.7)
Neutrophils Relative %: 72 %
Platelets: 261 10*3/uL (ref 150–400)
RBC: 3.86 MIL/uL — ABNORMAL LOW (ref 3.87–5.11)
RDW: 15.4 % (ref 11.5–15.5)
WBC: 13.2 10*3/uL — ABNORMAL HIGH (ref 4.0–10.5)
nRBC: 0 % (ref 0.0–0.2)

## 2018-07-08 LAB — PROTIME-INR
INR: 5.2 (ref 0.8–1.2)
INR: 1.4 — ABNORMAL HIGH (ref 0.8–1.2)
Prothrombin Time: 47.1 seconds — ABNORMAL HIGH (ref 11.4–15.2)
Prothrombin Time: 17 seconds — ABNORMAL HIGH (ref 11.4–15.2)

## 2018-07-08 LAB — URINALYSIS, ROUTINE W REFLEX MICROSCOPIC
Bilirubin Urine: NEGATIVE
Glucose, UA: NEGATIVE mg/dL
Hgb urine dipstick: NEGATIVE
Ketones, ur: NEGATIVE mg/dL
Leukocytes,Ua: NEGATIVE
Nitrite: NEGATIVE
Protein, ur: 30 mg/dL — AB
Specific Gravity, Urine: 1.02 (ref 1.005–1.030)
pH: 5 (ref 5.0–8.0)

## 2018-07-08 LAB — COMPREHENSIVE METABOLIC PANEL
ALBUMIN: 2.2 g/dL — AB (ref 3.5–5.0)
ALT: 32 U/L (ref 0–44)
AST: 61 U/L — ABNORMAL HIGH (ref 15–41)
Alkaline Phosphatase: 63 U/L (ref 38–126)
Anion gap: 20 — ABNORMAL HIGH (ref 5–15)
BILIRUBIN TOTAL: 0.5 mg/dL (ref 0.3–1.2)
BUN: 67 mg/dL — ABNORMAL HIGH (ref 8–23)
CO2: 13 mmol/L — ABNORMAL LOW (ref 22–32)
Calcium: 9.1 mg/dL (ref 8.9–10.3)
Chloride: 112 mmol/L — ABNORMAL HIGH (ref 98–111)
Creatinine, Ser: 1.6 mg/dL — ABNORMAL HIGH (ref 0.44–1.00)
GFR calc Af Amer: 34 mL/min — ABNORMAL LOW (ref >60)
GFR calc non Af Amer: 29 mL/min — ABNORMAL LOW (ref >60)
Glucose, Bld: 214 mg/dL — ABNORMAL HIGH (ref 70–99)
Potassium: 4.1 mmol/L (ref 3.5–5.1)
Sodium: 145 mmol/L (ref 135–145)
Total Protein: 5.4 g/dL — ABNORMAL LOW (ref 6.5–8.1)

## 2018-07-08 LAB — TROPONIN I: Troponin I: 0.03 ng/mL (ref <0.03)

## 2018-07-08 LAB — POCT I-STAT 7, (LYTES, BLD GAS, ICA,H+H)
Acid-base deficit: 15 mmol/L — ABNORMAL HIGH (ref 0.0–2.0)
Bicarbonate: 10.6 mmol/L — ABNORMAL LOW (ref 20.0–28.0)
Calcium, Ion: 1.19 mmol/L (ref 1.15–1.40)
HCT: 26 % — ABNORMAL LOW (ref 36.0–46.0)
Hemoglobin: 8.8 g/dL — ABNORMAL LOW (ref 12.0–15.0)
O2 Saturation: 96 %
Patient temperature: 94.4
Potassium: 4 mmol/L (ref 3.5–5.1)
Sodium: 146 mmol/L — ABNORMAL HIGH (ref 135–145)
TCO2: 11 mmol/L — ABNORMAL LOW (ref 22–32)
pCO2 arterial: 21.7 mmHg — ABNORMAL LOW (ref 32.0–48.0)
pH, Arterial: 7.285 — ABNORMAL LOW (ref 7.350–7.450)
pO2, Arterial: 77 mmHg — ABNORMAL LOW (ref 83.0–108.0)

## 2018-07-08 LAB — BRAIN NATRIURETIC PEPTIDE: B Natriuretic Peptide: 122.7 pg/mL — ABNORMAL HIGH (ref 0.0–100.0)

## 2018-07-08 LAB — CBC
HEMATOCRIT: 44.1 % (ref 36.0–46.0)
Hemoglobin: 14 g/dL (ref 12.0–15.0)
MCH: 26.4 pg (ref 26.0–34.0)
MCHC: 31.7 g/dL (ref 30.0–36.0)
MCV: 83.2 fL (ref 80.0–100.0)
Platelets: 185 10*3/uL (ref 150–400)
RBC: 5.3 MIL/uL — ABNORMAL HIGH (ref 3.87–5.11)
RDW: 15.1 % (ref 11.5–15.5)
WBC: 26.6 10*3/uL — ABNORMAL HIGH (ref 4.0–10.5)
nRBC: 0 % (ref 0.0–0.2)

## 2018-07-08 LAB — POC OCCULT BLOOD, ED: Fecal Occult Bld: POSITIVE — AB

## 2018-07-08 LAB — I-STAT TROPONIN, ED: Troponin i, poc: 0.02 ng/mL (ref 0.00–0.08)

## 2018-07-08 LAB — CBG MONITORING, ED: Glucose-Capillary: 150 mg/dL — ABNORMAL HIGH (ref 70–99)

## 2018-07-08 LAB — GLUCOSE, CAPILLARY
Glucose-Capillary: 158 mg/dL — ABNORMAL HIGH (ref 70–99)
Glucose-Capillary: 143 mg/dL — ABNORMAL HIGH (ref 70–99)

## 2018-07-08 LAB — LACTIC ACID, PLASMA
Lactic Acid, Venous: >11 mmol/L (ref 0.5–1.9)
Lactic Acid, Venous: 2.9 mmol/L (ref 0.5–1.9)

## 2018-07-08 LAB — PREPARE RBC (CROSSMATCH)

## 2018-07-08 LAB — ABO/RH: ABO/RH(D): B POS

## 2018-07-08 LAB — TSH: TSH: 0.678 u[IU]/mL (ref 0.350–4.500)

## 2018-07-08 MED ORDER — LORAZEPAM 2 MG/ML IJ SOLN
0.5000 mg | Freq: Once | INTRAMUSCULAR | Status: AC
  Administered 2018-07-09: 0.5 mg via INTRAVENOUS
  Filled 2018-07-08: qty 1

## 2018-07-08 MED ORDER — NOREPINEPHRINE 4 MG/250ML-% IV SOLN
2.0000 ug/min | INTRAVENOUS | Status: DC
  Administered 2018-07-08: 5 ug/min via INTRAVENOUS
  Administered 2018-07-08: 10 ug/min via INTRAVENOUS
  Filled 2018-07-08 (×2): qty 250

## 2018-07-08 MED ORDER — VITAMIN K1 10 MG/ML IJ SOLN
10.0000 mg | INTRAVENOUS | Status: AC
  Administered 2018-07-08: 10 mg via INTRAVENOUS
  Filled 2018-07-08: qty 1

## 2018-07-08 MED ORDER — SODIUM CHLORIDE 0.9 % IV SOLN: 250.0000 mL | INTRAVENOUS | Status: DC

## 2018-07-08 MED ORDER — LACTATED RINGERS IV BOLUS
1000.0000 mL | Freq: Once | INTRAVENOUS | Status: AC
  Administered 2018-07-08: 1000 mL via INTRAVENOUS

## 2018-07-08 MED ORDER — VANCOMYCIN HCL 500 MG IV SOLR
500.0000 mg | INTRAVENOUS | Status: DC
  Filled 2018-07-08: qty 500

## 2018-07-08 MED ORDER — PROTHROMBIN COMPLEX CONC HUMAN 500 UNITS IV KIT
2057.0000 [IU] | PACK | Status: AC
  Administered 2018-07-08: 2057 [IU] via INTRAVENOUS
  Filled 2018-07-08: qty 2057

## 2018-07-08 MED ORDER — SODIUM CHLORIDE 0.9 % IV BOLUS (SEPSIS)
1000.0000 mL | Freq: Once | INTRAVENOUS | Status: AC
  Administered 2018-07-08: 1000 mL via INTRAVENOUS

## 2018-07-08 MED ORDER — PANTOPRAZOLE SODIUM 40 MG IV SOLR
40.0000 mg | Freq: Two times a day (BID) | INTRAVENOUS | Status: DC
  Administered 2018-07-08 – 2018-07-14 (×12): 40 mg via INTRAVENOUS
  Filled 2018-07-08 (×13): qty 40

## 2018-07-08 MED ORDER — METRONIDAZOLE IN NACL 5-0.79 MG/ML-% IV SOLN
500.0000 mg | Freq: Three times a day (TID) | INTRAVENOUS | Status: DC
  Administered 2018-07-08: 500 mg via INTRAVENOUS
  Filled 2018-07-08: qty 100

## 2018-07-08 MED ORDER — PIPERACILLIN-TAZOBACTAM 3.375 G IVPB
3.3750 g | Freq: Three times a day (TID) | INTRAVENOUS | Status: AC
  Administered 2018-07-09 – 2018-07-11 (×9): 3.375 g via INTRAVENOUS
  Filled 2018-07-08 (×10): qty 50

## 2018-07-08 MED ORDER — SODIUM CHLORIDE 0.9% IV SOLUTION
Freq: Once | INTRAVENOUS | Status: AC
  Administered 2018-07-08: 16:00:00 via INTRAVENOUS

## 2018-07-08 MED ORDER — SODIUM CHLORIDE 0.9 % IV SOLN
8.0000 mg/h | INTRAVENOUS | Status: DC
  Filled 2018-07-08 (×2): qty 80

## 2018-07-08 MED ORDER — VANCOMYCIN HCL IN DEXTROSE 1-5 GM/200ML-% IV SOLN
1000.0000 mg | Freq: Once | INTRAVENOUS | Status: AC
  Administered 2018-07-08: 1000 mg via INTRAVENOUS
  Filled 2018-07-08: qty 200

## 2018-07-08 MED ORDER — PANTOPRAZOLE SODIUM 40 MG IV SOLR
40.0000 mg | Freq: Once | INTRAVENOUS | Status: AC
  Administered 2018-07-08: 40 mg via INTRAVENOUS
  Filled 2018-07-08: qty 40

## 2018-07-08 MED ORDER — INSULIN ASPART 100 UNIT/ML ~~LOC~~ SOLN
0.0000 [IU] | SUBCUTANEOUS | Status: DC
  Administered 2018-07-08: 2 [IU] via SUBCUTANEOUS
  Administered 2018-07-08 – 2018-07-09 (×2): 1 [IU] via SUBCUTANEOUS
  Administered 2018-07-10: 2 [IU] via SUBCUTANEOUS
  Administered 2018-07-10 – 2018-07-11 (×2): 1 [IU] via SUBCUTANEOUS

## 2018-07-08 MED ORDER — HALOPERIDOL LACTATE 5 MG/ML IJ SOLN: 2.5000 mg | Freq: Four times a day (QID) | INTRAMUSCULAR | Status: DC | PRN

## 2018-07-08 MED ORDER — SODIUM CHLORIDE 0.9 % IV SOLN
80.0000 mg | Freq: Once | INTRAVENOUS | Status: DC
  Filled 2018-07-08: qty 80

## 2018-07-08 MED ORDER — VITAMIN K1 10 MG/ML IJ SOLN
10.0000 mg | Freq: Once | INTRAMUSCULAR | Status: DC
  Filled 2018-07-08: qty 1

## 2018-07-08 MED ORDER — VANCOMYCIN HCL IN DEXTROSE 1-5 GM/200ML-% IV SOLN
1000.0000 mg | INTRAVENOUS | Status: DC
  Administered 2018-07-10: 1000 mg via INTRAVENOUS
  Filled 2018-07-08: qty 200

## 2018-07-08 MED ORDER — PANTOPRAZOLE SODIUM 40 MG IV SOLR: 40.0000 mg | Freq: Two times a day (BID) | INTRAVENOUS | Status: DC

## 2018-07-08 MED ORDER — LEVOTHYROXINE SODIUM 100 MCG/5ML IV SOLN
25.0000 ug | Freq: Every day | INTRAVENOUS | Status: DC
  Administered 2018-07-09 – 2018-07-11 (×3): 25 ug via INTRAVENOUS
  Filled 2018-07-08 (×3): qty 5

## 2018-07-08 MED ORDER — SODIUM CHLORIDE 0.9 % IV BOLUS (SEPSIS)
250.0000 mL | Freq: Once | INTRAVENOUS | Status: AC
  Administered 2018-07-08: 250 mL via INTRAVENOUS

## 2018-07-08 MED ORDER — SODIUM CHLORIDE 0.9 % IV BOLUS
1000.0000 mL | Freq: Once | INTRAVENOUS | Status: AC
  Administered 2018-07-08: 1000 mL via INTRAVENOUS

## 2018-07-08 MED ORDER — SODIUM CHLORIDE 0.9 % IV SOLN
2.0000 g | Freq: Once | INTRAVENOUS | Status: AC
  Administered 2018-07-08: 2 g via INTRAVENOUS
  Filled 2018-07-08: qty 2

## 2018-07-08 NOTE — ED Notes (Addendum)
Family at bedside. Pulses dopplered in pedal

## 2018-07-08 NOTE — ED Notes (Signed)
Pt returned from CT °

## 2018-07-08 NOTE — ED Notes (Signed)
Pt was rolled to be cleaned in ordered for temp foley insertion (cleared by charge, Vicente Males). She had large copious black liquid stool pour out of her. Cleaned and brief placed and pt has become pale and tacynepnic.

## 2018-07-08 NOTE — Progress Notes (Signed)
RT unaware arterial I-stat ordered until 15 minutes ago.  Per ED MD- he still wants.  RN aware.

## 2018-07-08 NOTE — ED Notes (Signed)
Pt repeating "daddy daddy" over and over- agitated. Husband- who she calls daddy is at bedside providing reassurance. Soft restraints to ankles applied- pt keeps kicking off bair hugger and xray of feet not able to be obtained at this time.

## 2018-07-08 NOTE — H&P (Addendum)
NAME:  Becky Mcmahon, MRN:  [id number], DOB:  07/08/1930, LOS: 0 ADMISSION DATE:  07/08/2018, CONSULTATION DATE:  2/28 REFERRING MD:  Dr. Regenia Skeeter EDP, CHIEF COMPLAINT:  Shock   Brief History   85 year old female admitted with shock of unclear etiology and altered mental status.   History of present illness   85 year old female with PMH as below, which is significant for CAD s/p CABG, breast Ca s/p R mastectomy, DVT on lifelong warfarin, HTN, HLD, DM, mild dementia, and cerebral atherosclerosis. Recent medical course complicated by compression fracture of spine 7 months ago. She has been seen by neurosurgery who did not recommend surgery. She has been using tylenol for pain. More recently she has been treated for infection of R great toe nail plate infection and cellulitis of the tight hallux. R great toe plate was removed on 2/24 and she was prescribe doxycycline, which family reports she has been taking. She presented to the ED 2/27 for abdominal pain, which was felt to be secondary to her back pain. CT abdomen at that time was unremarkable.   Then 2/28 she was in her normal state of health with the exception of some nausea. Her daughter left her side and returned 5 minutes later to find her slumped over and minimally responsive. EMS was called and she was transported to the ED. Upon arrival she was found to be hypotensive to SBP 65 and was bolused with IVF. After 5 liters her SBP improved to maintain above 100. Mental status improved, and quickly became very agitated. Laboratory evaluation significnat for WBC 13, hgb 9.3, CO2 13, lactic acid 11, INR 5.2. PCCM asked to evaluate for admission.   Past Medical History   has a past medical history of Anxiety, Arthritis, Breast cancer ([place]), CAD (coronary artery disease), Cerebral atherosclerosis, DVT (deep venous thrombosis) ([place]), GERD (gastroesophageal reflux disease), Guaiac positive stools, Hypercholesteremia, Hyperlipidemia, Hypertension,  Hypothyroidism, Osteoporosis, Ovarian cyst, Pancreatitis, S/P CABG x 5 (10/25/1995), Severe aortic stenosis, Swelling of right lower extremity, Type II diabetes mellitus ([place]), and Vitamin D deficiency.  Significant Hospital Events   2/28 admit  Consults:  [facility] 2/28 >  Procedures:    Significant Diagnostic Tests:  CT abd/pelvis 2/28 > No acute abdominal or pelvic abnormality. Large hiatal hernia and large right, small left pleural effusions.  CT head 2/28 > No evidence of acute intracranial abnormality. Mild chronic small vessel ischemic disease.  Micro Data:  Blood 2/28 > Urine 2/28 >  Antimicrobials:  Doxycycline 2/24 > 2/28 Aztreonam 2/28 Zosyn 2/28 > Vancomycin 2/28 >  Interim history/subjective:    Objective   Blood pressure (!) 94/47, pulse (!) 58, temperature (!) 94.4 F (34.7 C), temperature source Rectal, resp. rate (!) 36, SpO2 94 %.       No intake or output data in the 24 hours ending 07/08/18 1446 There were no vitals filed for this visit.  Examination: General: frail elderly female writhing in bed HENT: [place]/AT, PERRL, no JVD Lungs: Clear bilateral breath sounds Cardiovascular: RRR, no MRG Abdomen: Soft, non-tender, non-distended Extremities: R great toe with dried blood and recent surgical nail plate avulsion.  Neuro: Altered. Awake and perseverating speech, incomprehensible at times. Very agitated.   Resolved Hospital Problem list     Assessment & Plan:   Shock: Suspect hemorrhagic shock secdonary to gastrointestinal hemorrhage with new finding of black liquid stools. She is hemoccult positive and her INR is 5.2 from warfarin. She recently had PPI discontinued in  primary care office.  - Admit to ICU - Transfuse 2 units PRBC and 2 units FFP - H&H every 6 hours - Protonix infusion - Hold warfarin - Consider warfarin reversal with [place], will hold off for now.  - Continue norepinephrine to keep MAP > 52mmHg. May need CVL - Trend lactic acid -  Empiric antibiotics as above until sepsis ruled out.  - Blood, urine cultures.  - Family endorses DNR if arrests, intubation only to facilitate procedure such as endoscopy.   Acute metabolic encephalopathy - etiology unclear. Her mental status suffered a very precipitous change. Her confusion is not accompanied by any focal defect. She is not lethargic or post-ictal. CT head unremarkable.  - Monitor in ICU - May need intubation for airway protection if she exhausts herself.  - Check ammonia - Consider EEG if she is able to sit still at any point.  AKI: related to shock - Follow BMP after resuscitation   DM2 with hyperglycemia - CBG monitoring and SSI.   Hypothyroid - Check TSH - Synthroid at half dose IV  Best practice:  Diet: NPO Pain/Anxiety/Delirium protocol (if indicated): Na VAP protocol (if indicated): Na DVT prophylaxis: SCD GI prophylaxis: PPI infusion Glucose control: SSI Mobility: BR Code Status: Limited. No CPR. Intubation for procedure facilitation only.  Family Communication: Husband and daughter updated by myself and Dr. Lamonte Sakai.  Disposition: ICU  Labs   CBC: Recent Labs  Lab 07/07/18 1450 07/08/18 1218  WBC 19.1* 13.2*  NEUTROABS  --  9.7*  HGB 11.6* 9.3*  HCT 38.5 32.9*  MCV 82.3 85.2  PLT 299 256    Basic Metabolic Panel: Recent Labs  Lab 07/07/18 1450 07/08/18 1218  NA 143 145  K 5.0 4.1  CL 108 112*  CO2 25 13*  GLUCOSE 168* 214*  BUN 51* 67*  CREATININE 1.35* 1.60*  CALCIUM 9.5 9.1   GFR: CrCl cannot be calculated (Unknown ideal weight.). Recent Labs  Lab 07/07/18 1450 07/08/18 1218  WBC 19.1* 13.2*  LATICACIDVEN  --  >11.0*    Liver Function Tests: Recent Labs  Lab 07/07/18 1450 07/08/18 1218  AST 36 61*  ALT 7 32  ALKPHOS 86 63  BILITOT 1.5* 0.5  PROT 7.4 5.4*  ALBUMIN 3.5 2.2*   Recent Labs  Lab 07/07/18 1450  LIPASE 50   No results for input(s): AMMONIA in the last 168 hours.  ABG    Component Value  Date/Time   PHART 7.423 05/23/2012 1142   PCO2ART 39.0 05/23/2012 1142   PO2ART 58.0 (L) 05/23/2012 1142   HCO3 26.8 (H) 05/23/2012 1157   TCO2 28 05/23/2012 1157   ACIDBASEDEF 1.0 05/23/2012 1143   O2SAT 73.0 05/23/2012 1157     Coagulation Profile: Recent Labs  Lab 07/08/18 1218  INR 5.2*    Cardiac Enzymes: Recent Labs  Lab 07/08/18 1218  TROPONINI 0.03*    HbA1C: No results found for: HGBA1C  CBG: Recent Labs  Lab 07/08/18 1217  GLUCAP 150*    Review of Systems:   Unable as patient is encephalopathic  Past Medical History  She,  has a past medical history of Anxiety, Arthritis, Breast cancer ([place]), CAD (coronary artery disease), Cerebral atherosclerosis, DVT (deep venous thrombosis) ([place]), GERD (gastroesophageal reflux disease), Guaiac positive stools, Hypercholesteremia, Hyperlipidemia, Hypertension, Hypothyroidism, Osteoporosis, Ovarian cyst, Pancreatitis, S/P CABG x 5 (10/25/1995), Severe aortic stenosis, Swelling of right lower extremity, Type II diabetes mellitus ([place]), and Vitamin D deficiency.   Surgical History    Past  Surgical History:  Procedure Laterality Date  . AORTIC VALVE REPLACEMENT    . CARDIOLITE MYOCARDIAL PERFUSION STUDY  06/13/2004   Normal static and dynamic myocardial perfusion images with EF of 73%  . CORONARY ARTERY BYPASS GRAFT  10/25/1995   x5 , Dr Prescott Gum  . HIP ARTHROPLASTY     right  . LAPAROTOMY    . LEFT AND RIGHT HEART CATHETERIZATION WITH CORONARY/GRAFT ANGIOGRAM N/A 05/23/2012   Procedure: LEFT AND RIGHT HEART CATHETERIZATION WITH Beatrix Fetters;  Surgeon: Sanda Klein, MD;  Location: [facility] CATH LAB;  Service: Cardiovascular;  Laterality: N/A;  . right mastectomy  1979     Social History   reports that she quit smoking about 49 years ago. She has a 9.50 pack-year smoking history. She has never used smokeless tobacco. She reports that she does not drink alcohol or use drugs.   Family History   Her family history  includes Heart disease in her brother, father, and sister; Heart disease (age of onset: 59) in her sister; Heart disease (age of onset: 37) in her brother; Pancreatitis in her mother.   Allergies Allergies  Allergen Reactions  . Contrast Media  [Iodinated Diagnostic Agents] Itching  . Keflex [Cephalexin] Rash    Severe itching  . Latex Rash  . Neosporin [Neomycin-Bacitracin Zn-Polymyx] Rash     Home Medications  Prior to Admission medications   Medication Sig Start Date End Date Taking? Authorizing Provider  Ascorbic Acid (VITAMIN C) 1000 MG tablet Take 1,000 mg by mouth daily.     [provider]  aspirin EC 81 MG tablet Take 81 mg by mouth every evening.     [provider]  atorvastatin (LIPITOR) 20 MG tablet Take 20 mg by mouth every evening.  08/13/14   [provider]  cetirizine (ZYRTEC) 10 MG tablet Take 10 mg by mouth daily.    [provider]  Cholecalciferol (VITAMIN D) 2000 units tablet Take 2,000 Units by mouth every evening.     [provider]  docusate sodium (COLACE) 100 MG capsule Take 1 capsule (100 mg total) by mouth every 12 (twelve) hours. Patient not taking: Reported on 07/07/2018 12/27/17   Sherwood Gambler, MD  donepezil (ARICEPT) 5 MG tablet Take 5 mg by mouth at bedtime.  06/28/18   [provider]  doxycycline (VIBRA-TABS) 100 MG tablet Take 1 tablet (100 mg total) by mouth 2 (two) times daily. 07/04/18   Edrick Kins, DPM  fluticasone (FLONASE) 50 MCG/ACT nasal spray Place 1 spray into both nostrils daily.    [provider]  gabapentin (NEURONTIN) 100 MG capsule Take 1 capsule (100 mg total) by mouth 3 (three) times daily. Patient not taking: Reported on 07/07/2018 11/09/17   Drenda Freeze, MD  HYDROcodone-acetaminophen [facility]) 5-325 MG tablet Take 1 tablet by mouth every 6 (six) hours as needed for severe pain. Patient not taking: Reported on 07/07/2018 12/27/17   Sherwood Gambler, MD  ibuprofen  (ADVIL,MOTRIN) 200 MG tablet Take 200 mg by mouth every 6 (six) hours as needed for moderate pain.    [provider]  levothyroxine (SYNTHROID, LEVOTHROID) 50 MCG tablet Take 50 mcg by mouth daily before breakfast.     [provider]  lisinopril (PRINIVIL,ZESTRIL) 10 MG tablet Take 10 mg by mouth daily.  07/07/16   [provider]  LORazepam (ATIVAN) 0.5 MG tablet Take 0.5 mg by mouth 2 (two) times daily. Takes at lunchtime and bedtime.    [provider]  mirtazapine (REMERON) 7.5 MG tablet Take 7.5 mg by mouth at bedtime.  06/28/18   [provider]  Multiple Vitamin (MULTI-VITAMINS) TABS Take 1 tablet by mouth daily.     [provider]  Multiple Vitamins-Minerals (MULTIVITAMIN PO) Take 1 tablet by mouth daily.     [provider]  nystatin (NYSTATIN) powder Apply 1 g topically daily. Applies under breasts 05/12/16   [provider]  oxyCODONE (ROXICODONE) 5 MG immediate release tablet Take 0.5-1 tablets (2.5-5 mg total) by mouth every 6 (six) hours as needed for severe pain. Patient not taking: Reported on 07/07/2018 11/05/17   Duffy Bruce, MD  traMADol (ULTRAM) 50 MG tablet Take 1 tablet (50 mg total) by mouth every 8 (eight) hours as needed for moderate pain. Patient not taking: Reported on 07/07/2018 03/08/18   Daleen Bo, MD  valACYclovir (VALTREX) 1000 MG tablet Take 1 tablet (1,000 mg total) by mouth 3 (three) times daily. Patient not taking: Reported on 07/07/2018 11/09/17   Drenda Freeze, MD  warfarin (COUMADIN) 5 MG tablet Take 2.5-5 mg by mouth See admin instructions. Takes 2.5 mg daily with supper except takes 5 mg on Mon, Wed, Fri, Sat    [provider]     Critical care time: 44 mins     Georgann Housekeeper, AGACNP-BC [place] Pager [phone] or [phone]  07/08/2018 4:09 PM

## 2018-07-08 NOTE — Consult Note (Signed)
[facility] Consult: 4:03 PM 07/08/2018  LOS: 0 days    Referring Provider: Dr Lamonte Sakai of [facility]  Primary Care Physician:  Prince Solian, MD Primary Gastroenterologist:       Reason for Consultation: GI bleed.   HPI: Becky Mcmahon is a 85 y.o. female.  Hx breast cancer, status post mastectomy.  CAD, status post CABG.  Lifelong warfarin for history DVT.  Mild dementia.  Hypertension.  Diabetes.  Compression fracture of the spine.  Recent treatment for right great toe infection and cellulitis with removal of the right great toe plate on 2/72, prescribed doxycycline after this. Tubular adenoma on path report on 03/2009.   No colonoscopy report found and unable to determine which physician submitted the polyp.  Evaluated at ED 2/27 for abdominal/flank pain felt secondary to radiating spinal pain.  07/07/2018 noncontrast CT abdomen pelvis showed large hiatal hernia and large right cysts pleural effusion, small left pleural effusion.  Chronic L1 compression fracture and chronic compression deformity T9.  Aortic atherosclerosis without significant stenosis.  At some point recently her Protonix was discontinued, no details as to when or why. Besides Coumadin, home medicines include but not limited to ASA 81 mg.   No PPI, no H2 blocker.  Previously used ibuprofen but currently not taking.  Patient had been in her usual state of health and 5 minutes later her daughter found her slumped over and minimally responsive.  EMS arrived with SBP measuring 65.  She was bolused with IV fluids.  SBP improved to 100.  Temp 94.4.  She became agitated.  Hgb 9.3.  WBCs 13.  Lactic acid 7.  INR 5.2.  AKI BUN 67.   While in the ED she passed a large liquid dark/black stool. 2 PRBCs, 2 FFP ordered patient started on Protonix  infusion Started on epinephrine.  Patient is a partial code.  Family accepting of intubation to protect airway if needed for any procedures.  She arrests she is DNR.   Past Medical History:  Diagnosis Date  . Anxiety   . Arthritis   . Breast cancer ([place])    Rt mastectomy  . CAD (coronary artery disease)   . Cerebral atherosclerosis    CAROTID DOPPLER, 06/18/2009 - RIGHT/LEFT BULB AND PROXIMAL ICAs-0-49% diameter reduction  . DVT (deep venous thrombosis) (HCC)    Recurrent, 1st episode after CABG 1997, 2nd episode after hip surgery 2001, placed on life-long coumadin ever since; LEXISCAN, 03/13/2008 - normal, no ECG changes, EKG negative for ischemia  . GERD (gastroesophageal reflux disease)   . Guaiac positive stools   . Hypercholesteremia   . Hyperlipidemia   . Hypertension   . Hypothyroidism   . Osteoporosis   . Ovarian cyst   . Pancreatitis   . 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 Severe aortic stenosis    2D ECHO, 04/22/2012 - EF 60-65%, aortic valve-heavily calcified with  restricted leaflet motion, left atrium severly dilated  . Swelling of right lower extremity    LEA VENOUS DUPLEX SCAN, 08/18/2011 - no evidence of acute thrombus or thrombophlebitis  . Type II diabetes mellitus (HCC)    Diet controlled  . Vitamin D deficiency     Past Surgical History:  Procedure Laterality Date  . AORTIC VALVE REPLACEMENT    . CARDIOLITE MYOCARDIAL PERFUSION STUDY  06/13/2004   Normal static and dynamic myocardial perfusion images with EF of 73%  . CORONARY ARTERY BYPASS GRAFT  10/25/1995   x5 , Dr Prescott Gum  . HIP ARTHROPLASTY     right  . LAPAROTOMY    . LEFT AND RIGHT HEART CATHETERIZATION WITH CORONARY/GRAFT ANGIOGRAM N/A 05/23/2012   Procedure: LEFT AND RIGHT HEART CATHETERIZATION WITH Beatrix Fetters;  Surgeon: Sanda Klein, MD;  Location: [facility]  [facility];  Service: Cardiovascular;  Laterality: N/A;  . right mastectomy  1979    Prior to Admission medications   Medication Sig Start Date End Date Taking? Authorizing Provider  Ascorbic Acid (VITAMIN C) 1000 MG tablet Take 1,000 mg by mouth daily.     [provider]  aspirin EC 81 MG tablet Take 81 mg by mouth every evening.     [provider]  atorvastatin (LIPITOR) 20 MG tablet Take 20 mg by mouth every evening.  08/13/14   [provider]  cetirizine (ZYRTEC) 10 MG tablet Take 10 mg by mouth daily.    [provider]  Cholecalciferol (VITAMIN D) 2000 units tablet Take 2,000 Units by mouth every evening.     [provider]  docusate sodium (COLACE) 100 MG capsule Take 1 capsule (100 mg total) by mouth every 12 (twelve) hours. Patient not taking: Reported on 07/07/2018 12/27/17   Sherwood Gambler, MD  donepezil (ARICEPT) 5 MG tablet Take 5 mg by mouth at bedtime.  06/28/18   [provider]  doxycycline (VIBRA-TABS) 100 MG tablet Take 1 tablet (100 mg total) by mouth 2 (two) times daily. 07/04/18   Edrick Kins, DPM  fluticasone (FLONASE) 50 MCG/ACT nasal spray Place 1 spray into both nostrils daily.    [provider]  gabapentin (NEURONTIN) 100 MG capsule Take 1 capsule (100 mg total) by mouth 3 (three) times daily. Patient not taking: Reported on 07/07/2018 11/09/17   Drenda Freeze, MD  HYDROcodone-acetaminophen [facility]) 5-325 MG tablet Take 1 tablet by mouth every 6 (six) hours as needed for severe pain. Patient not taking: Reported on 07/07/2018 12/27/17   Sherwood Gambler, MD  ibuprofen (ADVIL,MOTRIN) 200 MG tablet Take 200 mg by mouth every 6 (six) hours as needed for moderate pain.    [provider]  levothyroxine (SYNTHROID, LEVOTHROID) 50 MCG tablet Take 50 mcg by mouth daily before breakfast.     [provider]  lisinopril (PRINIVIL,ZESTRIL) 10 MG tablet Take 10 mg by mouth daily.  07/07/16   [provider]  LORazepam (ATIVAN) 0.5 MG tablet Take 0.5 mg by mouth 2 (two) times daily. Takes at lunchtime and bedtime.    [provider]  mirtazapine (REMERON) 7.5 MG tablet Take 7.5 mg by mouth at bedtime.  06/28/18   [provider]  Multiple Vitamin (MULTI-VITAMINS) TABS Take 1 tablet by mouth daily.     [provider]  Multiple Vitamins-Minerals (MULTIVITAMIN PO) Take 1 tablet by mouth daily.     [provider]  nystatin (NYSTATIN) powder Apply 1 g topically daily. Applies under breasts  05/12/16   [provider]  oxyCODONE (ROXICODONE) 5 MG immediate release tablet Take 0.5-1 tablets (2.5-5 mg total) by mouth every 6 (six) hours as needed for severe pain. Patient not taking: Reported on 07/07/2018 11/05/17   Duffy Bruce, MD  traMADol (ULTRAM) 50 MG tablet Take 1 tablet (50 mg total) by mouth every 8 (eight) hours as needed for moderate pain. Patient not taking: Reported on 07/07/2018 03/08/18   Daleen Bo, MD  valACYclovir (VALTREX) 1000 MG tablet Take 1 tablet (1,000 mg total) by mouth 3 (three) times daily. Patient not taking: Reported on 07/07/2018 11/09/17   Drenda Freeze, MD  warfarin (COUMADIN) 5 MG tablet Take 2.5-5 mg by mouth See admin instructions. Takes 2.5 mg daily with supper except takes 5 mg on Mon, Wed, Fri, Sat    [provider]    Scheduled Meds: . sodium chloride   Intravenous Once  . [START ON 07/12/2018] pantoprazole  40 mg Intravenous Q12H   Infusions: . sodium chloride    . norepinephrine (LEVOPHED) Adult infusion 10 mcg/min (07/08/18 1535)  . pantoprazole (PROTONIX) IVPB    . pantoprozole (PROTONIX) infusion    . piperacillin-tazobactam (ZOSYN)  IV    . vancomycin    . [START ON 07/10/2018] vancomycin     PRN Meds: haloperidol lactate   Allergies as of 07/08/2018 - Review Complete 07/08/2018  Allergen Reaction Noted  . Contrast media  [iodinated diagnostic agents] Itching 10/13/2012  . Keflex  [cephalexin] Rash 04/23/2016  . Latex Rash 10/13/2012  . Neosporin [neomycin-bacitracin zn-polymyx] Rash 11/05/2017    Family History  Problem Relation Age of Onset  . Pancreatitis Mother   . Heart disease Father        family HX  . Heart disease Sister   . Heart disease Brother   . Heart disease Brother 31  . Heart disease Sister 55    Social History   Socioeconomic History  . Marital status: Married    Spouse name: Not on file  . Number of children: Not on file  . Years of education: Not on file  . Highest education level: Not on file  Occupational History  . Occupation: retired Psychologist, counselling: [employer]  . Financial resource strain: Not on file  . Food insecurity:    Worry: Not on file    Inability: Not on file  . Transportation needs:    Medical: Not on file    Non-medical: Not on file  Tobacco Use  . Smoking status: Former Smoker    Packs/day: 0.50    Years: 19.00    Pack years: 9.50    Last attempt to quit: 05/24/1969    Years since quitting: 49.1  . Smokeless tobacco: Never Used  Substance and Sexual Activity  . Alcohol use: No  . Drug use: No  . Sexual activity: Not on file  Lifestyle  . Physical activity:    Days per week: Not on file    Minutes per session: Not on file  . Stress: Not on file  Relationships  . Social connections:    Talks on phone: Not on file    Gets together: Not on file    Attends religious service: Not on file    Active member of club or organization: Not on file    Attends meetings of clubs or organizations: Not on file    Relationship status: Not on file  . Intimate partner violence:  Fear of current or ex partner: Not on file    Emotionally abused: Not on file    Physically abused: Not on file    Forced sexual activity: Not on file  Other Topics Concern  . Not on file  Social History Narrative   Lives with husband and 1 daughter, 2nd daughter lives in [place]  physically active and functionally independent   Active in church    REVIEW OF SYSTEMS: Unable to obtain ROS as the patient will not/cannot answer questions is not responding to commands.   PHYSICAL EXAM: Vital signs in last 24 hours: Vitals:   07/08/18 1540 07/08/18 1545  BP: (!) 94/36 (!) 105/31  Pulse: 98 95  Resp: (!) 24 (!) 33  Temp:    SpO2:  100%   Wt Readings from Last 3 Encounters:  11/09/17 70.3 kg  11/05/17 70.3 kg  04/23/16 72.6 kg    General: Pale, ill looking WF Head: No facial asymmetry or swelling.  No signs of head trauma. Eyes:   No scleral icterus.  No conjunctival pallor Ears: Unable to assess hearing. Nose: No congestion or discharge Mouth: No blood at the mouth. Neck: No JVD, no masses, no thyromegaly Lungs: Clear bilaterally.  No labored breathing. Heart: RRR.  No MRG.  S1, S2 present. Abdomen: Soft.  Nondistended.  Active bowel sounds.  Tenderness diffusely in the mid to upper abdomen bilaterally..   Rectal: Did not perform DRE or inspect rectum.  There is a tube collecting stool from her underwear.  This is liquid, dark/melenic looking. Musc/Skeltl: No gross joint deformities. Extremities: Erythema on some of her toes.  Feet are warm.  Nail plate removed from the right great toe. Neurologic: Not responding to voice, commands.  Not moving her limbs currently Skin: No large hematomas on incomplete survey of her skin. Nodes: No cervical adenopathy. Psych: Unable to assess as she is not able to verbalize for me.  .  Intake/Output from previous day: No intake/output data recorded. Intake/Output this shift: Total I/O In: 5000 [I.V.:5000] Out: -   LAB RESULTS: Recent Labs    07/07/18 1450 07/08/18 1218 07/08/18 1457  WBC 19.1* 13.2*  --   HGB 11.6* 9.3* 8.8*  HCT 38.5 32.9* 26.0*  PLT 299 261  --    BMET Lab Results  Component Value Date   NA 146 (H) 07/08/2018   NA 145 07/08/2018   NA 143 07/07/2018   K 4.0 07/08/2018   K 4.1  07/08/2018   K 5.0 07/07/2018   CL 112 (H) 07/08/2018   CL 108 07/07/2018   CL 107 11/09/2017   CO2 13 (L) 07/08/2018   CO2 25 07/07/2018   CO2 28 11/09/2017   GLUCOSE 214 (H) 07/08/2018   GLUCOSE 168 (H) 07/07/2018   GLUCOSE 167 (H) 11/09/2017   BUN 67 (H) 07/08/2018   BUN 51 (H) 07/07/2018   BUN 31 (H) 11/09/2017   CREATININE 1.60 (H) 07/08/2018   CREATININE 1.35 (H) 07/07/2018   CREATININE 1.23 (H) 11/09/2017   CALCIUM 9.1 07/08/2018   CALCIUM 9.5 07/07/2018   CALCIUM 9.5 11/09/2017   LFT Recent Labs    07/07/18 1450 07/08/18 1218  PROT 7.4 5.4*  ALBUMIN 3.5 2.2*  AST 36 61*  ALT 7 32  ALKPHOS 86 63  BILITOT 1.5* 0.5   PT/INR Lab Results  Component Value Date   INR 5.2 (HH) 07/08/2018   INR 2.69 11/09/2017   INR 2.22 11/05/2017   Hepatitis  Panel No results for input(s): HEPBSAG, HCVAB, HEPAIGM, HEPBIGM in the last 72 hours. C-Diff No components found for: CDIFF Lipase     Component Value Date/Time   LIPASE 50 07/07/2018 1450    Drugs of Abuse  No results found for: LABOPIA, COCAINSCRNUR, LABBENZ, AMPHETMU, THCU, LABBARB   RADIOLOGY STUDIES: Ct Abdomen Pelvis Wo Contrast  Result Date: 07/07/2018 CLINICAL DATA:  Flank pain EXAM: CT ABDOMEN AND PELVIS WITHOUT CONTRAST TECHNIQUE: Multidetector CT imaging of the abdomen and pelvis was performed following the standard protocol without IV contrast. COMPARISON:  CT abdomen pelvis 01/28/2018 FINDINGS: LOWER CHEST: Large right and small left pleural effusions. Large hiatal hernia. There is a proximal aortic stent. HEPATOBILIARY: The hepatic contours and density are normal. There is no intra- or extrahepatic biliary dilatation. Status post cholecystectomy. PANCREAS: The pancreatic parenchymal contours are normal and there is no ductal dilatation. There is no peripancreatic fluid collection. SPLEEN: Normal. ADRENALS/URINARY TRACT: --Adrenal glands: Normal. --Right kidney/ureter: No hydronephrosis,  nephroureterolithiasis, perinephric stranding or solid renal mass. --Left kidney/ureter: There is a lower pole cyst measuring 2.2 cm. No hydronephrosis, nephroureterolithiasis, perinephric stranding or solid renal mass. --Urinary bladder: Normal for degree of distention STOMACH/BOWEL: --Stomach/Duodenum: Large hiatal hernia. Normal duodenal course. --Small bowel: No dilatation or inflammation. --Colon: No focal abnormality. --Appendix: Not visualized. No right lower quadrant inflammation or free fluid. VASCULAR/LYMPHATIC: There is aortic atherosclerosis without hemodynamically significant stenosis. No abdominal or pelvic lymphadenopathy. REPRODUCTIVE: No free fluid in the pelvis. No adnexal mass. MUSCULOSKELETAL. Postsurgical changes of the right bony pelvis. Multilevel degenerative disc disease and facet arthrosis with chronic compression fracture of L1. There is also a chronic compression deformity of T9. OTHER: None. IMPRESSION: 1. No acute abdominal or pelvic abnormality. 2. Large hiatal hernia and large right, small left pleural effusions. Aortic Atherosclerosis (ICD10-I70.0). Electronically Signed   By: Ulyses Jarred M.D.   On: 07/07/2018 15:35   Dg Chest 2 View  Result Date: 07/07/2018 CLINICAL DATA:  Back pain and leukocytosis EXAM: CHEST - 2 VIEW COMPARISON:  12/27/2017 chest radiograph FINDINGS: Prior median sternotomy with CABG and aortic endograft. There is blunting of the costophrenic angles, likely pleural effusion or chronic pleural scarring. Unchanged multilevel thoracic compression deformities. No focal airspace consolidation. IMPRESSION: No active cardiopulmonary disease. Bilateral pleural scarring or small effusion. Electronically Signed   By: Ulyses Jarred M.D.   On: 07/07/2018 18:01   Ct Head Wo Contrast  Result Date: 07/08/2018 CLINICAL DATA:  Altered level of consciousness. EXAM: CT HEAD WITHOUT CONTRAST TECHNIQUE: Contiguous axial images were obtained from the base of the skull  through the vertex without intravenous contrast. COMPARISON:  04/07/2016 FINDINGS: Brain: There is no evidence of acute infarct, intracranial hemorrhage, mass, midline shift, or extra-axial fluid collection. Cerebral atrophy is unchanged. Cerebral white matter hypodensities are similar to the prior study and nonspecific but compatible with mild chronic small vessel ischemic disease. Vascular: Calcified atherosclerosis at the skull base. No hyperdense vessel. Skull: No fracture or focal osseous lesion. Sinuses/Orbits: Unchanged right sphenoid sinus mucous retention cyst. Clear mastoid air cells. Bilateral cataract extraction. Other: Small metallic object in the left external auditory canal, possibly a portion of a hearing aid. IMPRESSION: 1. No evidence of acute intracranial abnormality. 2. Mild chronic small vessel ischemic disease. Electronically Signed   By: Logan Bores M.D.   On: 07/08/2018 13:11   Dg Chest Portable 1 View  Result Date: 07/08/2018 CLINICAL DATA:  Hypoxia EXAM: PORTABLE CHEST 1 VIEW COMPARISON:  07/07/2018 FINDINGS: Postsurgical changes are  again seen and stable. Small bilateral pleural effusions are again identified and stable given some variation in the technical factors of the film. No focal infiltrate is seen. No acute bony abnormality is noted. IMPRESSION: Stable blunting of the costophrenic angles. No new focal abnormality is noted. Electronically Signed   By: Inez Catalina M.D.   On: 07/08/2018 12:41   Dg Foot Complete Right  Result Date: 07/08/2018 CLINICAL DATA:  Right great toe discoloration and laceration. Infection/sepsis. EXAM: RIGHT FOOT COMPLETE - 3+ VIEW COMPARISON:  07/04/2018 FINDINGS: No acute fracture or dislocation is identified. The bones are diffusely osteopenic. There is great toe swelling without evidence of underlying osseous erosion or soft tissue emphysema. Marginal osteophyte formation is most notable at the DIP joints of the second and third toes. A small  plantar calcaneal enthesophyte is noted. There are extensive atherosclerotic vascular calcifications. IMPRESSION: Soft tissue swelling without evidence of acute osseous abnormality. Electronically Signed   By: Logan Bores M.D.   On: 07/08/2018 13:48     IMPRESSION:   *     GI bleed.  Suspect upper GI source given dark stool, elevated BUN. Word is the Protonix she was taking had recently been discontinued but details not known  *   AMS.  No signs of CVA on head CT.  PRBCs ordered  *     Coagulopathy.  FFP ordered.  Coumadin for history DVT currently on hold  *      Hypotension.  On Levophed.  *     Sepsis criteria with elevated lactic acid  *      Normocytic anemia. PRBCs ordered.    *   Mild elevation AST, otherwise normal LFTs.  This may be the beginnings of shock liver.    PLAN:     *   Will need EGD.  Timing TBD.  Before EGD performed, coags need to be corrected.  *   ? IV vitamin K?   Azucena Freed  07/08/2018, 4:03 PM Phone [phone]

## 2018-07-08 NOTE — ED Notes (Signed)
Soft restraints taken off.

## 2018-07-08 NOTE — ED Notes (Signed)
Unable to give report call back  In 5 minutes

## 2018-07-08 NOTE — ED Notes (Addendum)
This Rn contacted ICU provider and informed him about large amount of black stool. He ordered to give 2 units of blood stat. He will put order in. Pt's condition is changed. She is now tachypneic, BP dropping again and she has decreased loc

## 2018-07-08 NOTE — ED Provider Notes (Addendum)
[facility] EMERGENCY DEPARTMENT Provider Note   CSN: [id number] Arrival date & time: 07/08/18  1207  LEVEL 5 CAVEAT - ALTERED MENTAL STATUS   History   Chief Complaint Chief Complaint  Patient presents with  . Respiratory Arrest    HPI Becky Mcmahon is a 85 y.o. female.     HPI  85 year old female from home with altered mental status and respiratory failure.  History is initially taken from EMS and later family.  EMS reports the patient was eating and then all of a sudden became catatonic and not breathing well.  They have been assisting ventilations.  She was given Narcan by fire department with no relief.  Since they have started bag-valve-mask ventilating her, her color has improved and she is becoming more awake and breathing a little bit better.  So hypotensive they could get a blood pressure but now after some fluids she is doing a little better and her last blood pressure was 65.  Family has arrived and the daughter states that she was sitting in a chair and they were tending to her foot, which recently had the toenail removed for an infection about 4 days ago.  She left and when she came back, the patient's head was slumped over and she was unresponsive.  No recent fevers but she is been having severe back pain for months and was in the ED yesterday.  No other specific complaints.  Past Medical History:  Diagnosis Date  . Anxiety   . Arthritis   . Breast cancer ([place])    Rt mastectomy  . CAD (coronary artery disease)   . Cerebral atherosclerosis    CAROTID DOPPLER, 06/18/2009 - RIGHT/LEFT BULB AND PROXIMAL ICAs-0-49% diameter reduction  . DVT (deep venous thrombosis) (HCC)    Recurrent, 1st episode after CABG 1997, 2nd episode after hip surgery 2001, placed on life-long coumadin ever since; LEXISCAN, 03/13/2008 - normal, no ECG changes, EKG negative for ischemia  . GERD (gastroesophageal reflux disease)   . Guaiac positive stools   .  Hypercholesteremia   . Hyperlipidemia   . Hypertension   . Hypothyroidism   . Osteoporosis   . Ovarian cyst   . Pancreatitis   . 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 Severe aortic stenosis    2D ECHO, 04/22/2012 - EF 60-65%, aortic valve-heavily calcified with restricted leaflet motion, left atrium severly dilated  . Swelling of right lower extremity    LEA VENOUS DUPLEX SCAN, 08/18/2011 - no evidence of acute thrombus or thrombophlebitis  . Type II diabetes mellitus (HCC)    Diet controlled  . Vitamin D deficiency     Patient Active Problem List   Diagnosis Date Noted  . Shock ([place]) 07/08/2018  . 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 Examination of participant in clinical trial 02/17/2013  . Breast cancer ([place]) 08/08/2012  . GERD (gastroesophageal reflux disease) 08/08/2012  . Hypothyroidism, unspecified 08/08/2012  . Osteoporosis 08/08/2012  . Aortic valve disorders 05/27/2012  . 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[place]) 05/27/2012  . Arthritis 05/27/2012  . HTN (hypertension) 05/27/2012  . Hyperlipidemia, unspecified 05/27/2012  . DVT (deep venous thrombosis) ([place])   . AS (aortic stenosis) 05/24/2012  . CAD (coronary artery disease) 05/24/2012  . S/P CABG x 5 10/25/1995    Past Surgical  History:  Procedure Laterality Date  . AORTIC VALVE REPLACEMENT    . CARDIOLITE MYOCARDIAL PERFUSION STUDY  06/13/2004   Normal static and dynamic myocardial perfusion images with EF of 73%  . CORONARY ARTERY BYPASS GRAFT  10/25/1995   x5 , Dr Prescott Gum  . HIP ARTHROPLASTY     right  . LAPAROTOMY    . LEFT AND RIGHT HEART CATHETERIZATION WITH CORONARY/GRAFT ANGIOGRAM N/A 05/23/2012   Procedure: LEFT AND RIGHT HEART CATHETERIZATION WITH Beatrix Fetters;  Surgeon: Sanda Klein, MD;  Location: [facility] CATH LAB;  Service: Cardiovascular;  Laterality:  N/A;  . right mastectomy  1979     OB History   No obstetric history on file.      Home Medications    Prior to Admission medications   Medication Sig Start Date End Date Taking? Authorizing Provider  Ascorbic Acid (VITAMIN C) 1000 MG tablet Take 1,000 mg by mouth daily.     [provider]  aspirin EC 81 MG tablet Take 81 mg by mouth every evening.     [provider]  atorvastatin (LIPITOR) 20 MG tablet Take 20 mg by mouth every evening.  08/13/14   [provider]  cetirizine (ZYRTEC) 10 MG tablet Take 10 mg by mouth daily.    [provider]  Cholecalciferol (VITAMIN D) 2000 units tablet Take 2,000 Units by mouth every evening.     [provider]  docusate sodium (COLACE) 100 MG capsule Take 1 capsule (100 mg total) by mouth every 12 (twelve) hours. Patient not taking: Reported on 07/07/2018 12/27/17   Sherwood Gambler, MD  donepezil (ARICEPT) 5 MG tablet Take 5 mg by mouth at bedtime.  06/28/18   [provider]  doxycycline (VIBRA-TABS) 100 MG tablet Take 1 tablet (100 mg total) by mouth 2 (two) times daily. 07/04/18   Edrick Kins, DPM  fluticasone (FLONASE) 50 MCG/ACT nasal spray Place 1 spray into both nostrils daily.    [provider]  gabapentin (NEURONTIN) 100 MG capsule Take 1 capsule (100 mg total) by mouth 3 (three) times daily. Patient not taking: Reported on 07/07/2018 11/09/17   Drenda Freeze, MD  HYDROcodone-acetaminophen [facility]) 5-325 MG tablet Take 1 tablet by mouth every 6 (six) hours as needed for severe pain. Patient not taking: Reported on 07/07/2018 12/27/17   Sherwood Gambler, MD  ibuprofen (ADVIL,MOTRIN) 200 MG tablet Take 200 mg by mouth every 6 (six) hours as needed for moderate pain.    [provider]  levothyroxine (SYNTHROID, LEVOTHROID) 50 MCG tablet Take 50 mcg by mouth daily before breakfast.     [provider]  lisinopril (PRINIVIL,ZESTRIL) 10 MG tablet Take 10 mg by mouth  daily.  07/07/16   [provider]  LORazepam (ATIVAN) 0.5 MG tablet Take 0.5 mg by mouth 2 (two) times daily. Takes at lunchtime and bedtime.    [provider]  mirtazapine (REMERON) 7.5 MG tablet Take 7.5 mg by mouth at bedtime.  06/28/18   [provider]  Multiple Vitamin (MULTI-VITAMINS) TABS Take 1 tablet by mouth daily.     [provider]  Multiple Vitamins-Minerals (MULTIVITAMIN PO) Take 1 tablet by mouth daily.     [provider]  nystatin (NYSTATIN) powder Apply 1 g topically daily. Applies under breasts 05/12/16   [provider]  oxyCODONE (ROXICODONE) 5 MG immediate release tablet Take 0.5-1 tablets (2.5-5 mg total) by mouth every 6 (six) hours as needed for severe pain. Patient not  taking: Reported on 07/07/2018 11/05/17   Duffy Bruce, MD  traMADol (ULTRAM) 50 MG tablet Take 1 tablet (50 mg total) by mouth every 8 (eight) hours as needed for moderate pain. Patient not taking: Reported on 07/07/2018 03/08/18   Daleen Bo, MD  valACYclovir (VALTREX) 1000 MG tablet Take 1 tablet (1,000 mg total) by mouth 3 (three) times daily. Patient not taking: Reported on 07/07/2018 11/09/17   Drenda Freeze, MD  warfarin (COUMADIN) 5 MG tablet Take 2.5-5 mg by mouth See admin instructions. Takes 2.5 mg daily with supper except takes 5 mg on Mon, Wed, Fri, Sat    [provider]    Family History Family History  Problem Relation Age of Onset  . Pancreatitis Mother   . Heart disease Father        family HX  . Heart disease Sister   . Heart disease Brother   . Heart disease Brother 64  . Heart disease Sister 36    Social History Social History   Tobacco Use  . Smoking status: Former Smoker    Packs/day: 0.50    Years: 19.00    Pack years: 9.50    Last attempt to quit: 05/24/1969    Years since quitting: 49.1  . Smokeless tobacco: Never Used  Substance Use Topics  . Alcohol use: No  . Drug use: No     Allergies     Contrast media  [iodinated diagnostic agents]; Keflex [cephalexin]; Latex; and Neosporin [neomycin-bacitracin zn-polymyx]   Review of Systems Review of Systems  Unable to perform ROS: Mental status change     Physical Exam Updated Vital Signs BP (!) 102/42   Pulse (!) 102   Temp (!) 96.4 F (35.8 C) (Temporal)   Resp (!) 32   SpO2 100%   Physical Exam Vitals signs and nursing note reviewed.  Constitutional:      General: She is not in acute distress.    Appearance: She is well-developed. She is ill-appearing. She is not diaphoretic.  HENT:     Head: Normocephalic and atraumatic.     Right Ear: External ear normal.     Left Ear: External ear normal.     Nose: Nose normal.  Eyes:     General:        Right eye: No discharge.        Left eye: No discharge.  Cardiovascular:     Rate and Rhythm: Regular rhythm. Tachycardia present.     Heart sounds: Normal heart sounds.  Pulmonary:     Effort: Tachypnea and accessory muscle usage present.     Breath sounds: Examination of the right-lower field reveals decreased breath sounds and rales. Examination of the left-lower field reveals decreased breath sounds and rales. Decreased breath sounds and rales present.  Abdominal:     Palpations: Abdomen is soft.     Tenderness: There is no abdominal tenderness.  Musculoskeletal:     Comments: Right great toenail is removed.  There is no purulent drainage or streaking cellulitis.  Skin:    General: Skin is warm and dry.  Neurological:     Mental Status: She is alert.     Comments: Patient has her eyes open but does not follow commands or acknowledge her name.  She is groaning and grunting.  Does spontaneously move her lower extremities.  Psychiatric:        Mood and Affect: Mood is not anxious.      ED Treatments / Results  Labs (all labs ordered are listed, but only abnormal results are displayed) Labs Reviewed  TROPONIN I - Abnormal; Notable for the following components:       Result Value   Troponin I 0.03 (*)    All other components within normal limits  BRAIN NATRIURETIC PEPTIDE - Abnormal; Notable for the following components:   B Natriuretic Peptide 122.7 (*)    All other components within normal limits  COMPREHENSIVE METABOLIC PANEL - Abnormal; Notable for the following components:   Chloride 112 (*)    CO2 13 (*)    Glucose, Bld 214 (*)    BUN 67 (*)    Creatinine, Ser 1.60 (*)    Total Protein 5.4 (*)    Albumin 2.2 (*)    AST 61 (*)    GFR calc non Af Amer 29 (*)    GFR calc Af Amer 34 (*)    Anion gap 20 (*)    All other components within normal limits  CBC WITH DIFFERENTIAL/PLATELET - Abnormal; Notable for the following components:   WBC 13.2 (*)    RBC 3.86 (*)    Hemoglobin 9.3 (*)    HCT 32.9 (*)    MCH 24.1 (*)    MCHC 28.3 (*)    Neutro Abs 9.7 (*)    Abs Immature Granulocytes 0.33 (*)    All other components within normal limits  PROTIME-INR - Abnormal; Notable for the following components:   Prothrombin Time 47.1 (*)    INR 5.2 (*)    All other components within normal limits  LACTIC ACID, PLASMA - Abnormal; Notable for the following components:   Lactic Acid, Venous >11.0 (*)    All other components within normal limits  URINALYSIS, ROUTINE W REFLEX MICROSCOPIC - Abnormal; Notable for the following components:   APPearance CLOUDY (*)    Protein, ur 30 (*)    Bacteria, UA RARE (*)    All other components within normal limits  CBG MONITORING, ED - Abnormal; Notable for the following components:   Glucose-Capillary 150 (*)    All other components within normal limits  POC OCCULT BLOOD, ED - Abnormal; Notable for the following components:   Fecal Occult Bld POSITIVE (*)    All other components within normal limits  POCT I-STAT 7, (LYTES, BLD GAS, ICA,H+H) - Abnormal; Notable for the following components:   pH, Arterial 7.285 (*)    pCO2 arterial 21.7 (*)    pO2, Arterial 77.0 (*)    Bicarbonate 10.6 (*)    TCO2 11 (*)     Acid-base deficit 15.0 (*)    Sodium 146 (*)    HCT 26.0 (*)    Hemoglobin 8.8 (*)    All other components within normal limits  CULTURE, BLOOD (ROUTINE X 2)  CULTURE, BLOOD (ROUTINE X 2)  URINE CULTURE  LACTIC ACID, PLASMA  CBC  CBC  I-STAT TROPONIN, ED  TYPE AND SCREEN  ABO/RH  PREPARE FRESH FROZEN PLASMA  PREPARE RBC (CROSSMATCH)    EKG EKG Interpretation  Date/Time:  Friday July 08 2018 12:20:29 EST Ventricular Rate:  100 PR Interval:    QRS Duration: 82 QT Interval:  393 QTC Calculation: 512 R Axis:   47 Text Interpretation:  Sinus tachycardia Prolonged QT interval diffuse ST depressions Confirmed by Sherwood Gambler [phone]) on 07/08/2018 12:27:56 PM   Radiology Ct Abdomen Pelvis Wo Contrast  Result Date: 07/07/2018 CLINICAL DATA:  Flank pain EXAM: CT ABDOMEN AND PELVIS WITHOUT CONTRAST TECHNIQUE: Multidetector  CT imaging of the abdomen and pelvis was performed following the standard protocol without IV contrast. COMPARISON:  CT abdomen pelvis 01/28/2018 FINDINGS: LOWER CHEST: Large right and small left pleural effusions. Large hiatal hernia. There is a proximal aortic stent. HEPATOBILIARY: The hepatic contours and density are normal. There is no intra- or extrahepatic biliary dilatation. Status post cholecystectomy. PANCREAS: The pancreatic parenchymal contours are normal and there is no ductal dilatation. There is no peripancreatic fluid collection. SPLEEN: Normal. ADRENALS/URINARY TRACT: --Adrenal glands: Normal. --Right kidney/ureter: No hydronephrosis, nephroureterolithiasis, perinephric stranding or solid renal mass. --Left kidney/ureter: There is a lower pole cyst measuring 2.2 cm. No hydronephrosis, nephroureterolithiasis, perinephric stranding or solid renal mass. --Urinary bladder: Normal for degree of distention STOMACH/BOWEL: --Stomach/Duodenum: Large hiatal hernia. Normal duodenal course. --Small bowel: No dilatation or inflammation. --Colon: No focal  abnormality. --Appendix: Not visualized. No right lower quadrant inflammation or free fluid. VASCULAR/LYMPHATIC: There is aortic atherosclerosis without hemodynamically significant stenosis. No abdominal or pelvic lymphadenopathy. REPRODUCTIVE: No free fluid in the pelvis. No adnexal mass. MUSCULOSKELETAL. Postsurgical changes of the right bony pelvis. Multilevel degenerative disc disease and facet arthrosis with chronic compression fracture of L1. There is also a chronic compression deformity of T9. OTHER: None. IMPRESSION: 1. No acute abdominal or pelvic abnormality. 2. Large hiatal hernia and large right, small left pleural effusions. Aortic Atherosclerosis (ICD10-I70.0). Electronically Signed   By: Ulyses Jarred M.D.   On: 07/07/2018 15:35   Dg Chest 2 View  Result Date: 07/07/2018 CLINICAL DATA:  Back pain and leukocytosis EXAM: CHEST - 2 VIEW COMPARISON:  12/27/2017 chest radiograph FINDINGS: Prior median sternotomy with CABG and aortic endograft. There is blunting of the costophrenic angles, likely pleural effusion or chronic pleural scarring. Unchanged multilevel thoracic compression deformities. No focal airspace consolidation. IMPRESSION: No active cardiopulmonary disease. Bilateral pleural scarring or small effusion. Electronically Signed   By: Ulyses Jarred M.D.   On: 07/07/2018 18:01   Ct Head Wo Contrast  Result Date: 07/08/2018 CLINICAL DATA:  Altered level of consciousness. EXAM: CT HEAD WITHOUT CONTRAST TECHNIQUE: Contiguous axial images were obtained from the base of the skull through the vertex without intravenous contrast. COMPARISON:  04/07/2016 FINDINGS: Brain: There is no evidence of acute infarct, intracranial hemorrhage, mass, midline shift, or extra-axial fluid collection. Cerebral atrophy is unchanged. Cerebral white matter hypodensities are similar to the prior study and nonspecific but compatible with mild chronic small vessel ischemic disease. Vascular: Calcified atherosclerosis  at the skull base. No hyperdense vessel. Skull: No fracture or focal osseous lesion. Sinuses/Orbits: Unchanged right sphenoid sinus mucous retention cyst. Clear mastoid air cells. Bilateral cataract extraction. Other: Small metallic object in the left external auditory canal, possibly a portion of a hearing aid. IMPRESSION: 1. No evidence of acute intracranial abnormality. 2. Mild chronic small vessel ischemic disease. Electronically Signed   By: Logan Bores M.D.   On: 07/08/2018 13:11   Dg Chest Portable 1 View  Result Date: 07/08/2018 CLINICAL DATA:  Hypoxia EXAM: PORTABLE CHEST 1 VIEW COMPARISON:  07/07/2018 FINDINGS: Postsurgical changes are again seen and stable. Small bilateral pleural effusions are again identified and stable given some variation in the technical factors of the film. No focal infiltrate is seen. No acute bony abnormality is noted. IMPRESSION: Stable blunting of the costophrenic angles. No new focal abnormality is noted. Electronically Signed   By: Inez Catalina M.D.   On: 07/08/2018 12:41   Dg Foot Complete Right  Result Date: 07/08/2018 CLINICAL DATA:  Right great toe discoloration  and laceration. Infection/sepsis. EXAM: RIGHT FOOT COMPLETE - 3+ VIEW COMPARISON:  07/04/2018 FINDINGS: No acute fracture or dislocation is identified. The bones are diffusely osteopenic. There is great toe swelling without evidence of underlying osseous erosion or soft tissue emphysema. Marginal osteophyte formation is most notable at the DIP joints of the second and third toes. A small plantar calcaneal enthesophyte is noted. There are extensive atherosclerotic vascular calcifications. IMPRESSION: Soft tissue swelling without evidence of acute osseous abnormality. Electronically Signed   By: Logan Bores M.D.   On: 07/08/2018 13:48    Procedures .Critical Care Performed by: Sherwood Gambler, MD Authorized by: Sherwood Gambler, MD   Critical care provider statement:    Critical care time (minutes):   45   Critical care was necessary to treat or prevent imminent or life-threatening deterioration of the following conditions:  Shock and respiratory failure   Critical care was time spent personally by me on the following activities:  Discussions with consultants, evaluation of patient's response to treatment, examination of patient, ordering and performing treatments and interventions, ordering and review of laboratory studies, ordering and review of radiographic studies, pulse oximetry, re-evaluation of patient's condition, obtaining history from patient or surrogate, review of old charts and development of treatment plan with patient or surrogate   (including critical care time)  Medications Ordered in ED Medications  0.9 %  sodium chloride infusion (has no administration in time range)  norepinephrine (LEVOPHED) 4mg  in 265mL premix infusion (10 mcg/min Intravenous New Bag/Given 07/08/18 1535)  vancomycin (VANCOCIN) IVPB 1000 mg/200 mL premix (has no administration in time range)  vancomycin (VANCOCIN) 500 mg in sodium chloride 0.9 % 100 mL IVPB (has no administration in time range)  piperacillin-tazobactam (ZOSYN) IVPB 3.375 g (has no administration in time range)  haloperidol lactate (HALDOL) injection 2.5 mg (has no administration in time range)  0.9 %  sodium chloride infusion (Manually program via Guardrails IV Fluids) (has no administration in time range)  pantoprazole (PROTONIX) 80 mg in sodium chloride 0.9 % 100 mL IVPB (has no administration in time range)  pantoprazole (PROTONIX) 80 mg in sodium chloride 0.9 % 250 mL (0.32 mg/mL) infusion (has no administration in time range)  pantoprazole (PROTONIX) injection 40 mg (has no administration in time range)  sodium chloride 0.9 % bolus 1,000 mL (0 mLs Intravenous Stopped 07/08/18 1306)  aztreonam (AZACTAM) 2 g in sodium chloride 0.9 % 100 mL IVPB (0 g Intravenous Stopped 07/08/18 1306)  vancomycin (VANCOCIN) IVPB 1000 mg/200 mL premix (0 mg  Intravenous Stopped 07/08/18 1356)  sodium chloride 0.9 % bolus 1,000 mL (0 mLs Intravenous Stopped 07/08/18 1306)    And  sodium chloride 0.9 % bolus 250 mL (0 mLs Intravenous Stopped 07/08/18 1306)  lactated ringers bolus 1,000 mL (0 mLs Intravenous Stopped 07/08/18 1442)     Initial Impression / Assessment and Plan / ED Course  I have reviewed the triage vital signs and the nursing notes.  Pertinent labs & imaging results that were available during my care of the patient were reviewed by me and considered in my medical decision making (see chart for details).        Patient presents in severe shock.  I think it was less likely to be a respiratory arrest, rather severe hypotension causing her to be altered and having difficulty breathing.  She was given broad antibiotics.  Patient was given 30 cc/kg and more and her pressure has come up but is still hypotensive.  She was started on  peripheral levophed.  I had discussion with daughter and husband, they would like full care at this point and have not thought of living will/advanced directives.  I discussed her overall poor prognosis, at this time still wants the full care.  She is breathing on her own and while she is tachypneic, her work of breathing has improved with resuscitation.  She is on nasal cannula at this time.  I do not think she needs emergent intubation.  ICU will admit.  Final Clinical Impressions(s) / ED Diagnoses   Final diagnoses:  Septic shock ([place])  Lactic acidosis    ED Discharge Orders    None       Sherwood Gambler, MD 07/08/18 1610    Sherwood Gambler, MD 07/25/18 [phone]

## 2018-07-08 NOTE — Progress Notes (Signed)
Pharmacy Antibiotic Note  Becky Mcmahon is a 85 y.o. female admitted on 07/08/2018 with code sepsis called - unknown source.  Pharmacy has been consulted for vancomycin/aztreonam dosing. Also with Flagyl ordered per EDP.  Patient with "rash" allergy to Keflex reported. Spoke with patient's family - they report patient has tolerated PCN antibiotics in the past with no issue and confirm Keflex allergy was only "rash." Augmentin outpatient Rx documented in chart. EDP ok to give Zosyn. Patient's most recent weight ~130 lbs per family. SCr 1.6 on admit.  1x doses of aztreonam 2g IV and vancomycin 1g IV already given in ER.  Plan: D/c aztreonam/Flagyl >> start Zosyn 3.375g IV q8h (4h infusion) Additional vancomycin 500mg  IV x 1 to complete 1500mg  IV load; then vancomycin 1000 mg IV Q 48 hrs. Goal AUC 400-550. Expected AUC: 519 SCr used: 1.6 Monitor clinical progress, c/s, renal function F/u de-escalation plan/LOT, vancomycin levels as indicated     Temp (24hrs), Avg:96.1 F (35.6 C), Min:94.5 F (34.7 C), Max:97.7 F (36.5 C)  Recent Labs  Lab 07/07/18 1450  WBC 19.1*  CREATININE 1.35*    CrCl cannot be calculated (Unknown ideal weight.).    Allergies  Allergen Reactions  . Contrast Media  [Iodinated Diagnostic Agents] Itching  . Keflex [Cephalexin] Rash    Severe itching  . Latex Rash  . Neosporin [Neomycin-Bacitracin Zn-Polymyx] Rash   Elicia Lamp, PharmD, BCPS Please check AMION for all [facility] contact numbers Clinical Pharmacist 07/08/2018 1:40 PM

## 2018-07-08 NOTE — ED Notes (Signed)
Hearing aids (2) given to family at this time

## 2018-07-08 NOTE — ED Notes (Signed)
Report called to rnb on 2h

## 2018-07-08 NOTE — ED Triage Notes (Signed)
Pt from home- was eating strawberries sitting in chair then heard her groaning and returned to room and she was not breathing well- agonally and unresponsive. EMS bagging. Narcan given but did not change her responsiveness. Pt has become more alert with bagging- wiggling toes and crying some and blowing back against bagging. Fingers cold and cyan anotic and mottled feet. Pt currently breathing 25/min and moaning

## 2018-07-08 NOTE — ED Notes (Signed)
Critical care at  The bedside  Family in room talking to ciritcal carfe

## 2018-07-09 ENCOUNTER — Encounter (HOSPITAL_COMMUNITY)
Admission: EM | Disposition: A | Discharge status: Skilled Nursing Facility | Payer: Self-pay | Source: Home / Self Care | Attending: Internal Medicine

## 2018-07-09 ENCOUNTER — Other Ambulatory Visit: Payer: Self-pay

## 2018-07-09 ENCOUNTER — Encounter (HOSPITAL_COMMUNITY): Payer: Self-pay | Admitting: Critical Care Medicine

## 2018-07-09 LAB — GLUCOSE, CAPILLARY
GLUCOSE-CAPILLARY: 123 mg/dL — AB (ref 70–99)
Glucose-Capillary: 107 mg/dL — ABNORMAL HIGH (ref 70–99)
Glucose-Capillary: 109 mg/dL — ABNORMAL HIGH (ref 70–99)
Glucose-Capillary: 109 mg/dL — ABNORMAL HIGH (ref 70–99)
Glucose-Capillary: 113 mg/dL — ABNORMAL HIGH (ref 70–99)
Glucose-Capillary: 122 mg/dL — ABNORMAL HIGH (ref 70–99)

## 2018-07-09 LAB — BPAM FFP
Blood Product Expiration Date: 202003042359
Blood Product Expiration Date: 202003042359
ISSUE DATE / TIME: 202002281557
ISSUE DATE / TIME: 202002281605
Unit Type and Rh: 1700
Unit Type and Rh: 7300

## 2018-07-09 LAB — PROTIME-INR
INR: 1.2 (ref 0.8–1.2)
INR: 1.1 (ref 0.8–1.2)
Prothrombin Time: 15.1 seconds (ref 11.4–15.2)
Prothrombin Time: 13.8 seconds (ref 11.4–15.2)

## 2018-07-09 LAB — PREPARE FRESH FROZEN PLASMA: Unit division: 0

## 2018-07-09 LAB — CBC
HCT: 41.7 % (ref 36.0–46.0)
HCT: 39.9 % (ref 36.0–46.0)
Hemoglobin: 13.6 g/dL (ref 12.0–15.0)
Hemoglobin: 12.6 g/dL (ref 12.0–15.0)
MCH: 26.8 pg (ref 26.0–34.0)
MCH: 25.9 pg — ABNORMAL LOW (ref 26.0–34.0)
MCHC: 32.6 g/dL (ref 30.0–36.0)
MCHC: 31.6 g/dL (ref 30.0–36.0)
MCV: 82.2 fL (ref 80.0–100.0)
MCV: 81.9 fL (ref 80.0–100.0)
PLATELETS: 189 10*3/uL (ref 150–400)
Platelets: 182 10*3/uL (ref 150–400)
RBC: 5.07 MIL/uL (ref 3.87–5.11)
RBC: 4.87 MIL/uL (ref 3.87–5.11)
RDW: 15.5 % (ref 11.5–15.5)
RDW: 15.9 % — ABNORMAL HIGH (ref 11.5–15.5)
WBC: 21.9 10*3/uL — ABNORMAL HIGH (ref 4.0–10.5)
WBC: 19.9 10*3/uL — ABNORMAL HIGH (ref 4.0–10.5)
nRBC: 0 % (ref 0.0–0.2)
nRBC: 0 % (ref 0.0–0.2)

## 2018-07-09 LAB — URINE CULTURE
Culture: <10000 — AB
Culture: NO GROWTH

## 2018-07-09 LAB — BASIC METABOLIC PANEL
Anion gap: 10 (ref 5–15)
BUN: 57 mg/dL — ABNORMAL HIGH (ref 8–23)
CO2: 13 mmol/L — ABNORMAL LOW (ref 22–32)
CREATININE: 1.2 mg/dL — AB (ref 0.44–1.00)
Calcium: 8.3 mg/dL — ABNORMAL LOW (ref 8.9–10.3)
Chloride: 125 mmol/L — ABNORMAL HIGH (ref 98–111)
GFR calc Af Amer: 48 mL/min — ABNORMAL LOW (ref >60)
GFR calc non Af Amer: 41 mL/min — ABNORMAL LOW (ref >60)
Glucose, Bld: 114 mg/dL — ABNORMAL HIGH (ref 70–99)
Potassium: 3.9 mmol/L (ref 3.5–5.1)
Sodium: 148 mmol/L — ABNORMAL HIGH (ref 135–145)

## 2018-07-09 LAB — MAGNESIUM: Magnesium: 1.9 mg/dL (ref 1.7–2.4)

## 2018-07-09 LAB — PHOSPHORUS: Phosphorus: 3 mg/dL (ref 2.5–4.6)

## 2018-07-09 LAB — MRSA PCR SCREENING: MRSA BY PCR: NEGATIVE

## 2018-07-09 LAB — LACTIC ACID, PLASMA: Lactic Acid, Venous: 1.2 mmol/L (ref 0.5–1.9)

## 2018-07-09 SURGERY — CANCELLED PROCEDURE

## 2018-07-09 MED ORDER — VANCOMYCIN HCL 500 MG IV SOLR
500.0000 mg | Freq: Once | INTRAVENOUS | Status: AC
  Administered 2018-07-09: 500 mg via INTRAVENOUS
  Filled 2018-07-09: qty 500

## 2018-07-09 MED ORDER — MORPHINE SULFATE (PF) 2 MG/ML IV SOLN
2.0000 mg | INTRAVENOUS | Status: DC | PRN
  Administered 2018-07-11: 2 mg via INTRAVENOUS
  Filled 2018-07-09: qty 1

## 2018-07-09 MED ORDER — CHLORHEXIDINE GLUCONATE 0.12 % MT SOLN
15.0000 mL | Freq: Two times a day (BID) | OROMUCOSAL | Status: DC
  Administered 2018-07-09 – 2018-07-10 (×4): 15 mL via OROMUCOSAL
  Filled 2018-07-09: qty 15

## 2018-07-09 MED ORDER — SODIUM CHLORIDE 0.9 % IV SOLN
INTRAVENOUS | Status: DC
  Administered 2018-07-09: 06:00:00 via INTRAVENOUS

## 2018-07-09 MED ORDER — ORAL CARE MOUTH RINSE
15.0000 mL | Freq: Two times a day (BID) | OROMUCOSAL | Status: DC
  Administered 2018-07-09 – 2018-07-10 (×3): 15 mL via OROMUCOSAL

## 2018-07-09 MED ORDER — FENTANYL CITRATE (PF) 100 MCG/2ML IJ SOLN
25.0000 ug | Freq: Once | INTRAMUSCULAR | Status: AC
  Administered 2018-07-09: 25 ug via INTRAVENOUS
  Filled 2018-07-09: qty 2

## 2018-07-09 SURGICAL SUPPLY — 14 items

## 2018-07-09 NOTE — Progress Notes (Signed)
Patient crying out in pain with her back, multiple postions tried without helping.  CCM notified

## 2018-07-09 NOTE — Progress Notes (Signed)
Patient screaming out in pain with back and feet, confused and agitated un consolable at this time.  Ativan .5mg  given with no response.  MD notified, awaiting orders.

## 2018-07-09 NOTE — Progress Notes (Signed)
Pt due to void- 0 urine output since admission and has received over  6+ Liters of fluid. Pt also has extensive MASD/ sacral skin breakdown and having continuous loose/ bloody stool. Pt cool- Temp 96 oral.Verbal orders given to place temp foley and rectal tube.

## 2018-07-09 NOTE — Progress Notes (Signed)
[facility] ROUNDING NOTE   Subjective: Rectal tube with dark heme positive stool Complaints of severe back pain Off pressors.   Hemoglobin 14 after 4 unit PRBC transfusion INR 1.2   Objective: Vital signs in last 24 hours: Temp:  [94.4 F (34.7 C)-98.8 F (37.1 C)] 98.8 F (37.1 C) (02/29 1100) Pulse Rate:  [58-118] 109 (02/29 1100) Resp:  [20-42] 32 (02/29 1100) BP: (64-138)/(31-83) 127/56 (02/29 1100) SpO2:  [87 %-100 %] 96 % (02/29 1100) FiO2 (%):  [2 %] 2 % (02/28 2000) Weight:  [65 kg] 65 kg (02/28 1700) Last BM Date: 07/09/18 General: NAD Lungs: Clear Heart: Regular, tachycardic Abdomen: Soft no distention, nontender Ext: No edema    Intake/Output from previous day: 02/28 0701 - 02/29 0700 In: 6568.6 [I.V.:5220.7; Blood:1167.6; IV Piggyback:180.3] Out: 783 [Urine:333; Stool:450] Intake/Output this shift: Total I/O In: -  Out: 155 [Urine:155]   Lab Results: Recent Labs    07/08/18 1218 07/08/18 1457 07/08/18 2238 07/09/18 0619  WBC 13.2*  --  26.6* 21.9*  HGB 9.3* 8.8* 14.0 13.6  PLT 261  --  185 189  MCV 85.2  --  83.2 82.2   BMET Recent Labs    07/07/18 1450 07/08/18 1218 07/08/18 1457 07/09/18 0630  NA 143 145 146* 148*  K 5.0 4.1 4.0 3.9  CL 108 112*  --  125*  CO2 25 13*  --  13*  GLUCOSE 168* 214*  --  114*  BUN 51* 67*  --  57*  CREATININE 1.35* 1.60*  --  1.20*  CALCIUM 9.5 9.1  --  8.3*   LFT Recent Labs    07/07/18 1450 07/08/18 1218  PROT 7.4 5.4*  ALBUMIN 3.5 2.2*  AST 36 61*  ALT 7 32  ALKPHOS 86 63  BILITOT 1.5* 0.5   PT/INR Recent Labs    07/08/18 2231 07/09/18 0619  INR 1.4* 1.2      Imaging/Other results: Ct Abdomen Pelvis Wo Contrast  Result Date: 07/07/2018 CLINICAL DATA:  Flank pain EXAM: CT ABDOMEN AND PELVIS WITHOUT CONTRAST TECHNIQUE: Multidetector CT imaging of the abdomen and pelvis was performed following the standard protocol without IV contrast. COMPARISON:  CT abdomen pelvis  01/28/2018 FINDINGS: LOWER CHEST: Large right and small left pleural effusions. Large hiatal hernia. There is a proximal aortic stent. HEPATOBILIARY: The hepatic contours and density are normal. There is no intra- or extrahepatic biliary dilatation. Status post cholecystectomy. PANCREAS: The pancreatic parenchymal contours are normal and there is no ductal dilatation. There is no peripancreatic fluid collection. SPLEEN: Normal. ADRENALS/URINARY TRACT: --Adrenal glands: Normal. --Right kidney/ureter: No hydronephrosis, nephroureterolithiasis, perinephric stranding or solid renal mass. --Left kidney/ureter: There is a lower pole cyst measuring 2.2 cm. No hydronephrosis, nephroureterolithiasis, perinephric stranding or solid renal mass. --Urinary bladder: Normal for degree of distention STOMACH/BOWEL: --Stomach/Duodenum: Large hiatal hernia. Normal duodenal course. --Small bowel: No dilatation or inflammation. --Colon: No focal abnormality. --Appendix: Not visualized. No right lower quadrant inflammation or free fluid. VASCULAR/LYMPHATIC: There is aortic atherosclerosis without hemodynamically significant stenosis. No abdominal or pelvic lymphadenopathy. REPRODUCTIVE: No free fluid in the pelvis. No adnexal mass. MUSCULOSKELETAL. Postsurgical changes of the right bony pelvis. Multilevel degenerative disc disease and facet arthrosis with chronic compression fracture of L1. There is also a chronic compression deformity of T9. OTHER: None. IMPRESSION: 1. No acute abdominal or pelvic abnormality. 2. Large hiatal hernia and large right, small left pleural effusions. Aortic Atherosclerosis (ICD10-I70.0). Electronically Signed   By: Cletus Gash.D.  On: 07/07/2018 15:35   Dg Chest 2 View  Result Date: 07/07/2018 CLINICAL DATA:  Back pain and leukocytosis EXAM: CHEST - 2 VIEW COMPARISON:  12/27/2017 chest radiograph FINDINGS: Prior median sternotomy with CABG and aortic endograft. There is blunting of the costophrenic  angles, likely pleural effusion or chronic pleural scarring. Unchanged multilevel thoracic compression deformities. No focal airspace consolidation. IMPRESSION: No active cardiopulmonary disease. Bilateral pleural scarring or small effusion. Electronically Signed   By: Ulyses Jarred M.D.   On: 07/07/2018 18:01   Ct Head Wo Contrast  Result Date: 07/08/2018 CLINICAL DATA:  Altered level of consciousness. EXAM: CT HEAD WITHOUT CONTRAST TECHNIQUE: Contiguous axial images were obtained from the base of the skull through the vertex without intravenous contrast. COMPARISON:  04/07/2016 FINDINGS: Brain: There is no evidence of acute infarct, intracranial hemorrhage, mass, midline shift, or extra-axial fluid collection. Cerebral atrophy is unchanged. Cerebral white matter hypodensities are similar to the prior study and nonspecific but compatible with mild chronic small vessel ischemic disease. Vascular: Calcified atherosclerosis at the skull base. No hyperdense vessel. Skull: No fracture or focal osseous lesion. Sinuses/Orbits: Unchanged right sphenoid sinus mucous retention cyst. Clear mastoid air cells. Bilateral cataract extraction. Other: Small metallic object in the left external auditory canal, possibly a portion of a hearing aid. IMPRESSION: 1. No evidence of acute intracranial abnormality. 2. Mild chronic small vessel ischemic disease. Electronically Signed   By: Logan Bores M.D.   On: 07/08/2018 13:11   Dg Chest Portable 1 View  Result Date: 07/08/2018 CLINICAL DATA:  Hypoxia EXAM: PORTABLE CHEST 1 VIEW COMPARISON:  07/07/2018 FINDINGS: Postsurgical changes are again seen and stable. Small bilateral pleural effusions are again identified and stable given some variation in the technical factors of the film. No focal infiltrate is seen. No acute bony abnormality is noted. IMPRESSION: Stable blunting of the costophrenic angles. No new focal abnormality is noted. Electronically Signed   By: Inez Catalina M.D.    On: 07/08/2018 12:41   Dg Foot Complete Right  Result Date: 07/08/2018 CLINICAL DATA:  Right great toe discoloration and laceration. Infection/sepsis. EXAM: RIGHT FOOT COMPLETE - 3+ VIEW COMPARISON:  07/04/2018 FINDINGS: No acute fracture or dislocation is identified. The bones are diffusely osteopenic. There is great toe swelling without evidence of underlying osseous erosion or soft tissue emphysema. Marginal osteophyte formation is most notable at the DIP joints of the second and third toes. A small plantar calcaneal enthesophyte is noted. There are extensive atherosclerotic vascular calcifications. IMPRESSION: Soft tissue swelling without evidence of acute osseous abnormality. Electronically Signed   By: Logan Bores M.D.   On: 07/08/2018 13:48      Assessment &Plan  85 year old female with CAD status post CABG, breast cancer status post mastectomy, hypertension, hyperlipidemia, diabetes, mild dementia, DVT on Coumadin admitted with shock and altered mental status unclear etiology. Initial concern for possible GI hemorrhage as etiology of shock but her hemoglobin improved to 13-14 to PRBC transfusion and is not having any overt GI bleed.  Currently hemodynamically stable off pressors Continues to have altered mental status Severe back pain likely secondary to chronic compression injury, unclear if she has new exacerbation of pain On broad-spectrum antibiotics AKI improving GI bleeding in the setting of supratherapeutic INR, Coumadin on hold, reversed with Kcentra. EGD is likely going to be only diagnostic and not needed for therapy at this point.  She is at risk for intubation with procedure and sedation. Based on clinical response in the last 24 hours,  my suspicion for high risk GI lesion is low Will hold off EGD/endoscopic evaluation for now unless show signs of active GI bleeding Clear liquids today and advance diet tomorrow as tolerated PPI IV twice daily We will continue to follow,  available if have any questions     K. Denzil Magnuson , MD [phone]  [facility]

## 2018-07-09 NOTE — Progress Notes (Signed)
NAME:  Becky Mcmahon, MRN:  [id number], DOB:  05/12/1930, LOS: 1 ADMISSION DATE:  07/08/2018, CONSULTATION DATE:  2/28 REFERRING MD:  Dr. Regenia Skeeter EDP, CHIEF COMPLAINT:  Shock   Brief History   85 year old female admitted with shock of unclear etiology and altered mental status.   History of present illness   85 year old female with PMH as below, which is significant for CAD s/p CABG, breast Ca s/p R mastectomy, DVT on lifelong warfarin, HTN, HLD, DM, mild dementia, and cerebral atherosclerosis. Recent medical course complicated by compression fracture of spine 7 months ago. She has been seen by neurosurgery who did not recommend surgery. She has been using tylenol for pain. More recently she has been treated for infection of R great toe nail plate infection and cellulitis of the tight hallux. R great toe plate was removed on 2/24 and she was prescribe doxycycline, which family reports she has been taking. She presented to the ED 2/27 for abdominal pain, which was felt to be secondary to her back pain. CT abdomen at that time was unremarkable.   Then 2/28 she was in her normal state of health with the exception of some nausea. Her daughter left her side and returned 5 minutes later to find her slumped over and minimally responsive. EMS was called and she was transported to the ED. Upon arrival she was found to be hypotensive to SBP 65 and was bolused with IVF. After 5 liters her SBP improved to maintain above 100. Mental status improved, and quickly became very agitated. Laboratory evaluation significnat for WBC 13, hgb 9.3, CO2 13, lactic acid 11, INR 5.2. PCCM asked to evaluate for admission.   Past Medical History   has a past medical history of Anxiety, Arthritis, Breast cancer ([place]), CAD (coronary artery disease), Cerebral atherosclerosis, DVT (deep venous thrombosis) ([place]), GERD (gastroesophageal reflux disease), Guaiac positive stools, Hypercholesteremia, Hyperlipidemia, Hypertension,  Hypothyroidism, Osteoporosis, Ovarian cyst, Pancreatitis, S/P CABG x 5 (10/25/1995), Severe aortic stenosis, Swelling of right lower extremity, Type II diabetes mellitus ([place]), and Vitamin D deficiency.  Significant Hospital Events   2/28 admit  Consults:  [facility] 2/28 >  Procedures:    Significant Diagnostic Tests:  CT abd/pelvis 2/28 > No acute abdominal or pelvic abnormality. Large hiatal hernia and large right, small left pleural effusions.  CT head 2/28 > No evidence of acute intracranial abnormality. Mild chronic small vessel ischemic disease.  Micro Data:  Blood 2/28 > Urine 2/28 >  Antimicrobials:  Doxycycline 2/24 > 2/28 Aztreonam 2/28 Zosyn 2/28 > Vancomycin 2/28 >  Interim history/subjective:  Pressors have been weaned to off since last night She is having a lot of back pain, experienced some delirium after fentanyl, has received some Ativan Hemoglobin up to 14 after transfusion  Objective   Blood pressure 123/61, pulse (!) 107, temperature 98.4 F (36.9 C), resp. rate (!) 29, weight 65 kg, SpO2 98 %.    FiO2 (%):  [2 %] 2 %   Intake/Output Summary (Last 24 hours) at 07/09/2018 1044 Last data filed at 07/09/2018 1000 Gross per 24 hour  Intake 6568.56 ml  Output 938 ml  Net 5630.56 ml   Filed Weights   07/08/18 1700  Weight: 65 kg    Examination: General: Frail woman, lying in bed, more comfortable today HENT: Edentulous, no oral lesions, oropharynx dry Lungs: Clear bilaterally Cardiovascular: Regular, no murmur Abdomen: Soft, nondistended, mild diffuse tenderness, positive bowel sounds Extremities: Right great toe with nail  removal, some associated hemorrhage, no tenderness, no discharge Neuro: More calm today, wakes to voice, attempts to answer questions although poorly oriented, mild dysarthria  Resolved Hospital Problem list     Assessment & Plan:   Shock: Suspect hemorrhagic shock secdonary to gastrointestinal hemorrhage, possible component  septic but source unclear with new finding of black liquid stools. She is hemoccult positive and her INR was 5.2 from warfarin. She recently had PPI discontinued in primary care office.  No further transfusions, hemoglobin is stabilized Continue to follow CBC intermittently Appreciate gastroenterology assistance.  She would likely benefit from EGD but this decision is complicated by her frail status, her goals of care, the desire to avoid intubation if possible. PPI bid Warfarin held and reversed with Kcentra Pressors weaned to off Follow culture data, currently on empiric Zosyn and vancomycin Discussed with family goals of care, no CPR if she were to acutely decline.  They would consider intubation to facilitate EGD or procedures if it was felt that this was reversible.  Acute metabolic encephalopathy - etiology unclear but suspect due to shock state. Her mental status suffered a very precipitous change. Her confusion is not accompanied by any focal defect. She is not lethargic or post-ictal. CT head unremarkable.  Improving with supportive care, continue to follow mental status Careful with sedating medications although she is requiring analgesia as below  AKI: related to shock, improving Follow BMP, urine output with restoration good perfusion pressures  DM2 with hyperglycemia Sliding scale insulin as needed.   Hypothyroid Continue Synthroid as ordered  Severe back pain, arthritis, history of compression fracture Uses Tylenol at home, currently n.p.o. Has received some low-dose fentanyl, will change this to morphine to allow longer action.  Try to use ibuprofen or Tylenol when she is able to take p.o.    Best practice:  Diet: NPO Pain/Anxiety/Delirium protocol (if indicated): Na VAP protocol (if indicated): Na DVT prophylaxis: SCD GI prophylaxis: PPI infusion Glucose control: SSI Mobility: BR Code Status: Limited. No CPR. Intubation for procedure facilitation only.  Family  Communication: discussed with husband and daughters at bedside 2/29 Disposition: ICU  Labs   CBC: Recent Labs  Lab 07/07/18 1450 07/08/18 1218 07/08/18 1457 07/08/18 2238 07/09/18 0619  WBC 19.1* 13.2*  --  26.6* 21.9*  NEUTROABS  --  9.7*  --   --   --   HGB 11.6* 9.3* 8.8* 14.0 13.6  HCT 38.5 32.9* 26.0* 44.1 41.7  MCV 82.3 85.2  --  83.2 82.2  PLT 299 261  --  185 130    Basic Metabolic Panel: Recent Labs  Lab 07/07/18 1450 07/08/18 1218 07/08/18 1457 07/09/18 0630  NA 143 145 146* 148*  K 5.0 4.1 4.0 3.9  CL 108 112*  --  125*  CO2 25 13*  --  13*  GLUCOSE 168* 214*  --  114*  BUN 51* 67*  --  57*  CREATININE 1.35* 1.60*  --  1.20*  CALCIUM 9.5 9.1  --  8.3*  MG  --   --   --  1.9  PHOS  --   --   --  3.0   GFR: CrCl cannot be calculated (Unknown ideal weight.). Recent Labs  Lab 07/07/18 1450 07/08/18 1218 07/08/18 2238 07/09/18 0619 07/09/18 0630  WBC 19.1* 13.2* 26.6* 21.9*  --   LATICACIDVEN  --  >11.0* 2.9*  --  1.2    Liver Function Tests: Recent Labs  Lab 07/07/18 1450 07/08/18 1218  AST  36 61*  ALT 7 32  ALKPHOS 86 63  BILITOT 1.5* 0.5  PROT 7.4 5.4*  ALBUMIN 3.5 2.2*   Recent Labs  Lab 07/07/18 1450  LIPASE 50   No results for input(s): AMMONIA in the last 168 hours.  ABG    Component Value Date/Time   PHART 7.285 (L) 07/08/2018 1457   PCO2ART 21.7 (L) 07/08/2018 1457   PO2ART 77.0 (L) 07/08/2018 1457   HCO3 10.6 (L) 07/08/2018 1457   TCO2 11 (L) 07/08/2018 1457   ACIDBASEDEF 15.0 (H) 07/08/2018 1457   O2SAT 96.0 07/08/2018 1457     Coagulation Profile: Recent Labs  Lab 07/08/18 1218 07/08/18 2231 07/09/18 0619  INR 5.2* 1.4* 1.2    Cardiac Enzymes: Recent Labs  Lab 07/08/18 1218  TROPONINI 0.03*    HbA1C: No results found for: HGBA1C  CBG: Recent Labs  Lab 07/08/18 1217 07/08/18 1636 07/08/18 2005 07/08/18 2338 07/09/18 0405  GLUCAP 150* 158* 143* 122* 109*     Critical care time: 31 mins       Baltazar Apo, MD, PhD 07/09/2018, 11:02 AM [facility] [phone] or if no answer [phone]

## 2018-07-09 NOTE — Plan of Care (Signed)
Patient continues to have dark tarry stools her flexiseal.  Patient confused and complaining of foot and back pain despite pain medication and Ativan.  NPO for EGD, trending labs.  Will continue to monitor closely.

## 2018-07-10 DIAGNOSIS — R195 Other fecal abnormalities: Secondary | ICD-10-CM

## 2018-07-10 LAB — BPAM RBC
BLOOD PRODUCT EXPIRATION DATE: 202003262359
Blood Product Expiration Date: 202003262359
Blood Product Expiration Date: 202003272359
Blood Product Expiration Date: 202003272359
ISSUE DATE / TIME: 202002281530
ISSUE DATE / TIME: 202002281530
UNIT TYPE AND RH: 7300
Unit Type and Rh: 7300
Unit Type and Rh: 7300
Unit Type and Rh: 7300

## 2018-07-10 LAB — CBC
HEMATOCRIT: 37.9 % (ref 36.0–46.0)
Hemoglobin: 11.9 g/dL — ABNORMAL LOW (ref 12.0–15.0)
MCH: 26 pg (ref 26.0–34.0)
MCHC: 31.4 g/dL (ref 30.0–36.0)
MCV: 82.8 fL (ref 80.0–100.0)
Platelets: 202 10*3/uL (ref 150–400)
RBC: 4.58 MIL/uL (ref 3.87–5.11)
RDW: 16.7 % — ABNORMAL HIGH (ref 11.5–15.5)
WBC: 20.6 10*3/uL — ABNORMAL HIGH (ref 4.0–10.5)
nRBC: 0.1 % (ref 0.0–0.2)

## 2018-07-10 LAB — BASIC METABOLIC PANEL
Anion gap: 10 (ref 5–15)
BUN: 44 mg/dL — ABNORMAL HIGH (ref 8–23)
CHLORIDE: 129 mmol/L — AB (ref 98–111)
CO2: 15 mmol/L — ABNORMAL LOW (ref 22–32)
Calcium: 8.5 mg/dL — ABNORMAL LOW (ref 8.9–10.3)
Creatinine, Ser: 1.36 mg/dL — ABNORMAL HIGH (ref 0.44–1.00)
GFR calc Af Amer: 41 mL/min — ABNORMAL LOW (ref >60)
GFR calc non Af Amer: 35 mL/min — ABNORMAL LOW (ref >60)
Glucose, Bld: 127 mg/dL — ABNORMAL HIGH (ref 70–99)
POTASSIUM: 3.5 mmol/L (ref 3.5–5.1)
Sodium: 154 mmol/L — ABNORMAL HIGH (ref 135–145)

## 2018-07-10 LAB — TYPE AND SCREEN
ABO/RH(D): B POS
ANTIBODY SCREEN: NEGATIVE
UNIT DIVISION: 0
Unit division: 0
Unit division: 0
Unit division: 0

## 2018-07-10 LAB — MAGNESIUM: Magnesium: 2.1 mg/dL (ref 1.7–2.4)

## 2018-07-10 LAB — GLUCOSE, CAPILLARY
Glucose-Capillary: 109 mg/dL — ABNORMAL HIGH (ref 70–99)
Glucose-Capillary: 113 mg/dL — ABNORMAL HIGH (ref 70–99)
Glucose-Capillary: 103 mg/dL — ABNORMAL HIGH (ref 70–99)
Glucose-Capillary: 152 mg/dL — ABNORMAL HIGH (ref 70–99)
Glucose-Capillary: 139 mg/dL — ABNORMAL HIGH (ref 70–99)

## 2018-07-10 LAB — PROTIME-INR
INR: 1.1 (ref 0.8–1.2)
INR: 1.1 (ref 0.8–1.2)
PROTHROMBIN TIME: 14.1 s (ref 11.4–15.2)
Prothrombin Time: 13.9 seconds (ref 11.4–15.2)

## 2018-07-10 NOTE — Evaluation (Signed)
Clinical/Bedside Swallow Evaluation Patient Details  Name: Becky Mcmahon MRN: [id number] Date of Birth: [birth year]  Today's Date: 07/10/2018 Time: SLP Start Time (ACUTE ONLY): 66 SLP Stop Time (ACUTE ONLY): 0932 SLP Time Calculation (min) (ACUTE ONLY): 17 min  Past Medical History:  Past Medical History:  Diagnosis Date  . Anxiety   . Arthritis   . Breast cancer ([place])    Rt mastectomy  . CAD (coronary artery disease)   . Cerebral atherosclerosis    CAROTID DOPPLER, 06/18/2009 - RIGHT/LEFT BULB AND PROXIMAL ICAs-0-49% diameter reduction  . DVT (deep venous thrombosis) ([facility])    Recurrent, 1st episode after CABG 1997, 2nd episode after hip surgery 2001, placed on life-long coumadin ever since; LEXISCAN, 03/13/2008 - normal, no ECG changes, EKG negative for ischemia  . GERD (gastroesophageal reflux disease)   . Guaiac positive stools   . Hypercholesteremia   . Hyperlipidemia   . Hypertension   . Hypothyroidism   . Osteoporosis   . Ovarian cyst   . Pancreatitis   . 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 Severe aortic stenosis    2D ECHO, 04/22/2012 - EF 60-65%, aortic valve-heavily calcified with restricted leaflet motion, left atrium severly dilated  . Swelling of right lower extremity    LEA VENOUS DUPLEX SCAN, 08/18/2011 - no evidence of acute thrombus or thrombophlebitis  . Type II diabetes mellitus ([facility])    Diet controlled  . Vitamin D deficiency    Past Surgical History:  Past Surgical History:  Procedure Laterality Date  . AORTIC VALVE REPLACEMENT    . CARDIOLITE MYOCARDIAL PERFUSION STUDY  06/13/2004   Normal static and dynamic myocardial perfusion images with EF of 73%  . CORONARY ARTERY BYPASS GRAFT  10/25/1995   x5 , Dr Prescott Gum  . HIP ARTHROPLASTY     right  . LAPAROTOMY    . LEFT AND RIGHT HEART CATHETERIZATION WITH CORONARY/GRAFT ANGIOGRAM  N/A 05/23/2012   Procedure: LEFT AND RIGHT HEART CATHETERIZATION WITH Beatrix Fetters;  Surgeon: Sanda Klein, MD;  Location: [facility];  Service: Cardiovascular;  Laterality: N/A;  . right mastectomy  [id number]   HPI:  85 year old female with CAD status post CABG, breast cancer status post mastectomy, hypertension, hyperlipidemia, diabetes, mild dementia, hiatal hernia, DVT, and recent compression fracture of spine. Admitted with shock and altered mental status unclear etiology. Initial concern for possible GI hemorrhage as etiology of shock but per GI no signs of active GI bleeding. Recommended clear liquid diet, advance as tolerated. Pt has remained encephalopathic, CT head 07/08/18 showed no acute abnormality, CXR showed no active disease. Per RN pt was coughing with ice chips.   Assessment / Plan / Recommendation Clinical Impression  Pt at increased risk for aspiration primarily due to mentation, lethargy. She is oriented to self only, follows some simple commands, but keeps her eyes closed, opening them only briefly on verbal command. Significant xerostomia; pt allowed SLP to perform oral care but would not allow removal of her top denture for cleaning. Family reports placing bottom dentures yesterday however unable to locate. Pt refused ice chips and water via spoon, but did wake briefly when asked if she would like some water. Able to hold the cup and take small sips with assistance, with no overt signs of aspiration. With multiple straw sips, immediate coughing, suggestive  of decreased airway protection. Pt's respiratory rate fluctuating throughout assessment, from upper 20s to mid 30s. Recommend allowing small cup sips of thin water, ice chips, after oral care, and only if pt alert and positioned fully upright. Advise otherwise NPO with medications via alternative means. Hold ice/water if pt lethargic or coughing with intake. Will follow up next date.    SLP Visit Diagnosis: Dysphagia,  unspecified (R13.10)    Aspiration Risk  Moderate aspiration risk    Diet Recommendation NPO;Other (Comment)(small sips of water, ice chips when alert after oral care)   Liquid Administration via: Cup;No straw Medication Administration: Via alternative means Supervision: Full supervision/cueing for compensatory strategies;Staff to assist with self feeding Compensations: Slow rate;Small sips/bites Postural Changes: Seated upright at 90 degrees    Other  Recommendations Oral Care Recommendations: Oral care QID;Oral care prior to ice chip/H20   Follow up Recommendations Other (comment)(tbd)      Frequency and Duration min 2x/week  2 weeks       Prognosis Prognosis for Safe Diet Advancement: Good Barriers to Reach Goals: Cognitive deficits      Swallow Study   General Date of Onset: 07/08/18 HPI: 85 year old female with CAD status post CABG, breast cancer status post mastectomy, hypertension, hyperlipidemia, diabetes, mild dementia, hiatal hernia, DVT, and recent compression fracture of spine. Admitted with shock and altered mental status unclear etiology. Initial concern for possible GI hemorrhage as etiology of shock but per GI no signs of active GI bleeding. Recommended clear liquid diet, advance as tolerated. Pt has remained encephalopathic, CT head 07/08/18 showed no acute abnormality, CXR showed no active disease. Per RN pt was coughing with ice chips. Type of Study: Bedside Swallow Evaluation Previous Swallow Assessment: none in chart Diet Prior to this Study: Thin liquids Temperature Spikes Noted: Yes(99.7) Respiratory Status: Nasal cannula History of Recent Intubation: No Behavior/Cognition: Lethargic/Drowsy;Requires cueing Oral Cavity Assessment: Dry Oral Care Completed by SLP: Yes Oral Cavity - Dentition: Dentures, top;Other (Comment)(bottom dentures missing) Self-Feeding Abilities: Needs assist Patient Positioning: Upright in bed Baseline Vocal Quality: Low vocal  intensity Volitional Cough: Cognitively unable to elicit Volitional Swallow: Unable to elicit    Oral/Motor/Sensory Function Overall Oral Motor/Sensory Function: Generalized oral weakness   Ice Chips Ice chips: (pt refused)   Thin Liquid Thin Liquid: Impaired Presentation: Cup;Self Fed;Straw Pharyngeal  Phase Impairments: Cough - Immediate(with consecutive sips of thin via straw, none with cup sips)    Nectar Thick Nectar Thick Liquid: Not tested   Honey Thick Honey Thick Liquid: Not tested   Puree Puree: Not tested   Solid     Deneise Lever, [place], [place] Pager: [phone] Office: [phone]  Solid: Not tested      Aliene Altes 07/10/2018,9:53 AM

## 2018-07-10 NOTE — Progress Notes (Addendum)
[facility] Progress Note   Chief Complaint:   GI bleed    SUBJECTIVE:    pulling on pulse ox and wants IVs off. Family at bedside. Patient has no complaint other than back pain   ASSESSMENT AND PLAN:   72. 85 yo female with shock / GI bleed on admission, presumed upper with melena and 2 gram decline in hgb in setting of supra therapeutic INR.  Shock suspected to be hemorrhagic but sepsis also under consideration.  Bleeding seems to have stopped. Stool in drainage bag more dark green rather than black. -Normotensive, still tachycardic.  -Hgb 11.9. She has recieved 4 units PRBC -coumadin on hold, given Kcentra. INR now 1.1 -continue IV BID PPI -Probable EGD at some point when more medically stable.   2. Shock, sepsis vrs hemorrhagic.  -On IV antibiotics empirically. Blood and Ur cultures at day 1.   2. Metabolic encephalopathy. Head CTscan negative. Per Family, her mental status has significantly improved but not back to baseline.   3. AKI, Cr 1.36  4. Severe back pain, hx of compression fracture   OBJECTIVE:     Vital signs in last 24 hours: Temp:  [98.1 F (36.7 C)-99.7 F (37.6 C)] 99.7 F (37.6 C) (03/01 1100) Pulse Rate:  [104-111] 111 (03/01 1100) Resp:  [20-64] 23 (03/01 1100) BP: (101-134)/(50-76) 123/75 (03/01 1100) SpO2:  [86 %-100 %] 86 % (03/01 1100) Weight:  [65 kg-69 kg] 69 kg (03/01 0331) Last BM Date: 07/09/18 General:   Alert, well-developed female in NAD EENT:  Normal hearing, non icteric sclera, conjunctive pink.  Heart:  tachycardic.  No lower extremity edema   Pulm: Normal respiratory effort, lungs CTA bilaterally without wheezes or crackles. Abdomen:  Soft, nondistended, nontender.  Normal bowel sounds Neurologic:  Alert, interacting with family but slightly restless, wants IVs and pulse ox removed.  Psych:  Pleasant, cooperative.     Intake/Output from previous day: 02/29 0701 - 03/01 0700 In: 417.8 [I.V.:260; IV  Piggyback:147.8] Out: 2045 [Urine:795; Stool:1250] Intake/Output this shift: Total I/O In: 142 [I.V.:112.6; IV Piggyback:29.4] Out: 380 [Urine:155; Stool:225]  Lab Results: Recent Labs    07/09/18 0619 07/09/18 1723 07/10/18 0614  WBC 21.9* 19.9* 20.6*  HGB 13.6 12.6 11.9*  HCT 41.7 39.9 37.9  PLT 189 182 202   BMET Recent Labs    07/08/18 1218 07/08/18 1457 07/09/18 0630 07/10/18 0614  NA 145 146* 148* 154*  K 4.1 4.0 3.9 3.5  CL 112*  --  125* 129*  CO2 13*  --  13* 15*  GLUCOSE 214*  --  114* 127*  BUN 67*  --  57* 44*  CREATININE 1.60*  --  1.20* 1.36*  CALCIUM 9.1  --  8.3* 8.5*   LFT Recent Labs    07/08/18 1218  PROT 5.4*  ALBUMIN 2.2*  AST 61*  ALT 32  ALKPHOS 63  BILITOT 0.5   PT/INR Recent Labs    07/09/18 2355 07/10/18 0614  LABPROT 13.9 14.1  INR 1.1 1.1   Hepatitis Panel No results for input(s): HEPBSAG, HCVAB, HEPAIGM, HEPBIGM in the last 72 hours.  Ct Head Wo Contrast  Result Date: 07/08/2018 CLINICAL DATA:  Altered level of consciousness. EXAM: CT HEAD WITHOUT CONTRAST TECHNIQUE: Contiguous axial images were obtained from the base of the skull through the vertex without intravenous contrast. COMPARISON:  04/07/2016 FINDINGS: Brain: There is no evidence of acute infarct, intracranial hemorrhage, mass, midline shift, or extra-axial fluid collection. Cerebral atrophy  is unchanged. Cerebral white matter hypodensities are similar to the prior study and nonspecific but compatible with mild chronic small vessel ischemic disease. Vascular: Calcified atherosclerosis at the skull base. No hyperdense vessel. Skull: No fracture or focal osseous lesion. Sinuses/Orbits: Unchanged right sphenoid sinus mucous retention cyst. Clear mastoid air cells. Bilateral cataract extraction. Other: Small metallic object in the left external auditory canal, possibly a portion of a hearing aid. IMPRESSION: 1. No evidence of acute intracranial abnormality. 2. Mild chronic  small vessel ischemic disease. Electronically Signed   By: Logan Bores M.D.   On: 07/08/2018 13:11   Dg Chest Portable 1 View  Result Date: 07/08/2018 CLINICAL DATA:  Hypoxia EXAM: PORTABLE CHEST 1 VIEW COMPARISON:  07/07/2018 FINDINGS: Postsurgical changes are again seen and stable. Small bilateral pleural effusions are again identified and stable given some variation in the technical factors of the film. No focal infiltrate is seen. No acute bony abnormality is noted. IMPRESSION: Stable blunting of the costophrenic angles. No new focal abnormality is noted. Electronically Signed   By: Inez Catalina M.D.   On: 07/08/2018 12:41   Dg Foot Complete Right  Result Date: 07/08/2018 CLINICAL DATA:  Right great toe discoloration and laceration. Infection/sepsis. EXAM: RIGHT FOOT COMPLETE - 3+ VIEW COMPARISON:  07/04/2018 FINDINGS: No acute fracture or dislocation is identified. The bones are diffusely osteopenic. There is great toe swelling without evidence of underlying osseous erosion or soft tissue emphysema. Marginal osteophyte formation is most notable at the DIP joints of the second and third toes. A small plantar calcaneal enthesophyte is noted. There are extensive atherosclerotic vascular calcifications. IMPRESSION: Soft tissue swelling without evidence of acute osseous abnormality. Electronically Signed   By: Logan Bores M.D.   On: 07/08/2018 13:48     Active Problems:   Shock ([place])   Encephalopathy acute   Pressure injury of skin     LOS: 2 days   Tye Savoy ,NP 07/10/2018, 11:16 AM    Attending physician's note   I have taken an interval history, reviewed the chart and examined the patient. I agree with the Advanced Practitioner's note, impression and recommendations.   No evidence of ongoing GI hemorrhage Mental status improving but not back to baseline per family. Persistent leucocytosis, unclear source of infection. On broad spectrum antibiotics Will hold off EGD, as not  likely to alter overall management at this point.  Can reconsider if develops active GI bleeding Coumadin on hold, Ok to restart if indicated. Will need close monitoring to prevent supra therapeutic level. Continue to monitor H&H PPI twice daily We will sign off, available if have any questions or change in clinical status (recurrent bleeding).  Damaris Hippo , MD [phone]

## 2018-07-10 NOTE — Progress Notes (Addendum)
NAME:  Becky Mcmahon, MRN:  [id number], DOB:  [birth year], LOS: 2 ADMISSION DATE:  07/08/2018, CONSULTATION DATE:  2/28 REFERRING MD:  Dr. Regenia Skeeter EDP, CHIEF COMPLAINT:  Shock   Brief History   85 year old female admitted with shock of unclear etiology and altered mental status.   History of present illness   85 year old female with PMH as below, which is significant for CAD s/p CABG, breast Ca s/p R mastectomy, DVT on lifelong warfarin, HTN, HLD, DM, mild dementia, and cerebral atherosclerosis. Recent medical course complicated by compression fracture of spine 7 months ago. She has been seen by neurosurgery who did not recommend surgery. She has been using tylenol for pain. More recently she has been treated for infection of R great toe nail plate infection and cellulitis of the tight hallux. R great toe plate was removed on 2/24 and she was prescribe doxycycline, which family reports she has been taking. She presented to the ED 2/27 for abdominal pain, which was felt to be secondary to her back pain. CT abdomen at that time was unremarkable.   Then 2/28 she was in her normal state of health with the exception of some nausea. Her daughter left her side and returned 5 minutes later to find her slumped over and minimally responsive. EMS was called and she was transported to the ED. Upon arrival she was found to be hypotensive to SBP 65 and was bolused with IVF. After 5 liters her SBP improved to maintain above 100. Mental status improved, and quickly became very agitated. Laboratory evaluation significnat for WBC 13, hgb 9.3, CO2 13, lactic acid 11, INR 5.2. PCCM asked to evaluate for admission.   Past Medical History   has a past medical history of Anxiety, Arthritis, Breast cancer ([place]), CAD (coronary artery disease), Cerebral atherosclerosis, DVT (deep venous thrombosis) ([place]), GERD (gastroesophageal reflux disease), Guaiac positive stools, Hypercholesteremia, Hyperlipidemia, Hypertension,  Hypothyroidism, Osteoporosis, Ovarian cyst, Pancreatitis, S/P CABG x 5 (10/25/1995), Severe aortic stenosis, Swelling of right lower extremity, Type II diabetes mellitus ([place]), and Vitamin D deficiency.  Significant Hospital Events   2/28 admit  Consults:  [facility] 2/28 >  Procedures:    Significant Diagnostic Tests:  CT abd/pelvis 2/28 > No acute abdominal or pelvic abnormality. Large hiatal hernia and large right, small left pleural effusions.  CT head 2/28 > No evidence of acute intracranial abnormality. Mild chronic small vessel ischemic disease.  Micro Data:  Blood 2/28 > Urine 2/28 > negative  Antimicrobials:  Doxycycline 2/24 > 2/28 Aztreonam 2/28 Zosyn 2/28 > Vancomycin 2/28 > 3/1  Interim history/subjective:  Overall improved, stronger and more interactive today Hemodynamically stable Note hypernatremia  Objective   Blood pressure (!) 124/53, pulse (!) 106, temperature 99.5 F (37.5 C), resp. rate (!) 50, height 5' 4.02" (1.626 m), weight 69 kg, SpO2 96 %.        Intake/Output Summary (Last 24 hours) at 07/10/2018 1459 Last data filed at 07/10/2018 1400 Gross per 24 hour  Intake 754.98 ml  Output 1500 ml  Net -745.02 ml   Filed Weights   07/09/18 1953 07/09/18 2002 07/10/18 0331  Weight: 65 kg 65 kg 69 kg    Examination: General: Frail woman, lying in bed, more comfortable today HENT: Edentulous, no oral lesions, oropharynx dry Lungs: Clear bilaterally Cardiovascular: Regular, no murmur Abdomen: Soft, nondistended, mild diffuse tenderness, positive bowel sounds Extremities: Right great toe with nail removal, some associated hemorrhage, no tenderness, no discharge Neuro: More  awake, oriented to self, place, situation.  Moves all extremities.  Answers questions appropriately follows commands  Resolved Hospital Problem list     Assessment & Plan:   Shock: Suspect hemorrhagic shock secdonary to gastrointestinal hemorrhage, possible component septic but  source unclear with new finding of black liquid stools. She is hemoccult positive and her INR was 5.2 from warfarin. She recently had PPI discontinued in primary care office.  Continue to follow CBC daily Appreciate GI assistance, following for stability.  No plans for EGD at this time Continue PPI twice daily Warfarin held, likely needs more definitive GI diagnostics before we would commit to trying to restart it.  May be best to just stop it altogether Stop vancomycin in absence of clear evidence for infection, likely narrow antibiotics on 3/2 DNR status confirmed.  She would probably accept ventilation in order to facilitate procedures if short-term  Acute metabolic encephalopathy - etiology unclear but suspect due to shock state.  Continues to improve.  CT head unremarkable.  Continue to follow.  Avoid sedating medications  AKI: related to shock, improved and plateauing around SCr 1.36 Follow BMP, urine output  Hypernatremia, likely hypovolemic Push oral fluids if she can tolerate Start low-dose D5W Follow BMP  DM2 with hyperglycemia Sliding-scale insulin as needed  Hypothyroid Continue Synthroid  Severe back pain, arthritis, history of compression fracture Uses Tylenol at home, currently n.p.o. Low-dose morphine, transition back to either Tylenol or ibuprofen when she is able to tolerate    Best practice:  Diet: NPO per SLP recommendations Pain/Anxiety/Delirium protocol (if indicated): Na VAP protocol (if indicated): Na DVT prophylaxis: SCD GI prophylaxis: PPI infusion Glucose control: SSI Mobility: BR Code Status: Limited. No CPR. Intubation for procedure facilitation only.  Family Communication: discussed with husband and daughters at bedside 2/29 Disposition: ICU to progressive care  Labs   CBC: Recent Labs  Lab 07/08/18 1218 07/08/18 1457 07/08/18 2238 07/09/18 0619 07/09/18 1723 07/10/18 0614  WBC 13.2*  --  26.6* 21.9* 19.9* 20.6*  NEUTROABS 9.7*  --    --   --   --   --   HGB 9.3* 8.8* 14.0 13.6 12.6 11.9*  HCT 32.9* 26.0* 44.1 41.7 39.9 37.9  MCV 85.2  --  83.2 82.2 81.9 82.8  PLT 261  --  185 189 182 732    Basic Metabolic Panel: Recent Labs  Lab 07/07/18 1450 07/08/18 1218 07/08/18 1457 07/09/18 0630 07/10/18 0614  NA 143 145 146* 148* 154*  K 5.0 4.1 4.0 3.9 3.5  CL 108 112*  --  125* 129*  CO2 25 13*  --  13* 15*  GLUCOSE 168* 214*  --  114* 127*  BUN 51* 67*  --  57* 44*  CREATININE 1.35* 1.60*  --  1.20* 1.36*  CALCIUM 9.5 9.1  --  8.3* 8.5*  MG  --   --   --  1.9 2.1  PHOS  --   --   --  3.0  --    GFR: Estimated Creatinine Clearance: 28.8 mL/min (A) (by C-G formula based on SCr of 1.36 mg/dL (H)). Recent Labs  Lab 07/08/18 1218 07/08/18 2238 07/09/18 0619 07/09/18 0630 07/09/18 1723 07/10/18 0614  WBC 13.2* 26.6* 21.9*  --  19.9* 20.6*  LATICACIDVEN >11.0* 2.9*  --  1.2  --   --     Liver Function Tests: Recent Labs  Lab 07/07/18 1450 07/08/18 1218  AST 36 61*  ALT 7 32  ALKPHOS 86 63  BILITOT 1.5* 0.5  PROT 7.4 5.4*  ALBUMIN 3.5 2.2*   Recent Labs  Lab 07/07/18 1450  LIPASE 50   No results for input(s): AMMONIA in the last 168 hours.  ABG    Component Value Date/Time   PHART 7.285 (L) 07/08/2018 1457   PCO2ART 21.7 (L) 07/08/2018 1457   PO2ART 77.0 (L) 07/08/2018 1457   HCO3 10.6 (L) 07/08/2018 1457   TCO2 11 (L) 07/08/2018 1457   ACIDBASEDEF 15.0 (H) 07/08/2018 1457   O2SAT 96.0 07/08/2018 1457     Coagulation Profile: Recent Labs  Lab 07/08/18 2231 07/09/18 0619 07/09/18 1138 07/09/18 2355 07/10/18 0614  INR 1.4* 1.2 1.1 1.1 1.1    Cardiac Enzymes: Recent Labs  Lab 07/08/18 1218  TROPONINI 0.03*    HbA1C: No results found for: HGBA1C  CBG: Recent Labs  Lab 07/09/18 1938 07/09/18 2328 07/10/18 0306 07/10/18 0816 07/10/18 1156  GLUCAP 109* 107* 109* 113* 103*     Baltazar Apo, MD, PhD 07/10/2018, 2:59 PM [facility] [phone] or if no answer [phone]

## 2018-07-11 DIAGNOSIS — D62 Acute posthemorrhagic anemia: Secondary | ICD-10-CM

## 2018-07-11 DIAGNOSIS — K922 Gastrointestinal hemorrhage, unspecified: Secondary | ICD-10-CM

## 2018-07-11 LAB — BASIC METABOLIC PANEL
BUN: 39 mg/dL — ABNORMAL HIGH (ref 8–23)
BUN: 37 mg/dL — ABNORMAL HIGH (ref 8–23)
CO2: 19 mmol/L — ABNORMAL LOW (ref 22–32)
CO2: 16 mmol/L — ABNORMAL LOW (ref 22–32)
Calcium: 8.4 mg/dL — ABNORMAL LOW (ref 8.9–10.3)
Calcium: 8.4 mg/dL — ABNORMAL LOW (ref 8.9–10.3)
Chloride: >130 mmol/L (ref 98–111)
Chloride: >130 mmol/L (ref 98–111)
Creatinine, Ser: 1.37 mg/dL — ABNORMAL HIGH (ref 0.44–1.00)
Creatinine, Ser: 1.36 mg/dL — ABNORMAL HIGH (ref 0.44–1.00)
GFR calc Af Amer: 41 mL/min — ABNORMAL LOW (ref >60)
GFR calc non Af Amer: 35 mL/min — ABNORMAL LOW (ref >60)
GFR calc non Af Amer: 35 mL/min — ABNORMAL LOW (ref >60)
GFR, EST AFRICAN AMERICAN: 41 mL/min — AB (ref >60)
Glucose, Bld: 89 mg/dL (ref 70–99)
Glucose, Bld: 180 mg/dL — ABNORMAL HIGH (ref 70–99)
POTASSIUM: 3.2 mmol/L — AB (ref 3.5–5.1)
Potassium: 3 mmol/L — ABNORMAL LOW (ref 3.5–5.1)
Sodium: 156 mmol/L — ABNORMAL HIGH (ref 135–145)
Sodium: 154 mmol/L — ABNORMAL HIGH (ref 135–145)

## 2018-07-11 LAB — CBC
HCT: 34.2 % — ABNORMAL LOW (ref 36.0–46.0)
HCT: 35.9 % — ABNORMAL LOW (ref 36.0–46.0)
Hemoglobin: 10.8 g/dL — ABNORMAL LOW (ref 12.0–15.0)
Hemoglobin: 11.3 g/dL — ABNORMAL LOW (ref 12.0–15.0)
MCH: 26.5 pg (ref 26.0–34.0)
MCH: 26.3 pg (ref 26.0–34.0)
MCHC: 31.6 g/dL (ref 30.0–36.0)
MCHC: 31.5 g/dL (ref 30.0–36.0)
MCV: 84 fL (ref 80.0–100.0)
MCV: 83.7 fL (ref 80.0–100.0)
NRBC: 0.2 % (ref 0.0–0.2)
NRBC: 0.3 % — AB (ref 0.0–0.2)
Platelets: 173 10*3/uL (ref 150–400)
Platelets: 197 10*3/uL (ref 150–400)
RBC: 4.07 MIL/uL (ref 3.87–5.11)
RBC: 4.29 MIL/uL (ref 3.87–5.11)
RDW: 17.7 % — ABNORMAL HIGH (ref 11.5–15.5)
RDW: 18.1 % — AB (ref 11.5–15.5)
WBC: 21.3 10*3/uL — ABNORMAL HIGH (ref 4.0–10.5)
WBC: 20.7 10*3/uL — ABNORMAL HIGH (ref 4.0–10.5)

## 2018-07-11 LAB — GLUCOSE, CAPILLARY
Glucose-Capillary: 134 mg/dL — ABNORMAL HIGH (ref 70–99)
Glucose-Capillary: 138 mg/dL — ABNORMAL HIGH (ref 70–99)
Glucose-Capillary: 147 mg/dL — ABNORMAL HIGH (ref 70–99)
Glucose-Capillary: 150 mg/dL — ABNORMAL HIGH (ref 70–99)
Glucose-Capillary: 86 mg/dL (ref 70–99)
Glucose-Capillary: 86 mg/dL (ref 70–99)
Glucose-Capillary: 95 mg/dL (ref 70–99)

## 2018-07-11 MED ORDER — INSULIN ASPART 100 UNIT/ML ~~LOC~~ SOLN
0.0000 [IU] | Freq: Three times a day (TID) | SUBCUTANEOUS | Status: DC
  Administered 2018-07-11: 1 [IU] via SUBCUTANEOUS

## 2018-07-11 MED ORDER — DEXTROSE 5 % IV SOLN
INTRAVENOUS | Status: DC
  Administered 2018-07-11 – 2018-07-12 (×3): via INTRAVENOUS

## 2018-07-11 MED ORDER — DONEPEZIL HCL 5 MG PO TABS
5.0000 mg | ORAL_TABLET | Freq: Every day | ORAL | Status: DC
  Administered 2018-07-11 – 2018-07-19 (×9): 5 mg via ORAL
  Filled 2018-07-11 (×9): qty 1

## 2018-07-11 MED ORDER — MORPHINE SULFATE (PF) 2 MG/ML IV SOLN
2.0000 mg | INTRAVENOUS | Status: DC | PRN
  Administered 2018-07-11: 2 mg via INTRAVENOUS
  Filled 2018-07-11: qty 1

## 2018-07-11 MED ORDER — ACETAMINOPHEN 325 MG PO TABS
650.0000 mg | ORAL_TABLET | Freq: Four times a day (QID) | ORAL | Status: DC | PRN
  Administered 2018-07-16 – 2018-07-17 (×2): 650 mg via ORAL
  Filled 2018-07-11 (×2): qty 2

## 2018-07-11 MED ORDER — POTASSIUM CHLORIDE 10 MEQ/100ML IV SOLN
10.0000 meq | INTRAVENOUS | Status: AC
  Administered 2018-07-11 (×2): 10 meq via INTRAVENOUS
  Filled 2018-07-11 (×2): qty 100

## 2018-07-11 MED ORDER — INSULIN ASPART 100 UNIT/ML ~~LOC~~ SOLN: 0.0000 [IU] | Freq: Every day | SUBCUTANEOUS | Status: DC

## 2018-07-11 MED ORDER — LEVOTHYROXINE SODIUM 50 MCG PO TABS
50.0000 ug | ORAL_TABLET | Freq: Every day | ORAL | Status: DC
  Administered 2018-07-12 – 2018-07-20 (×9): 50 ug via ORAL
  Filled 2018-07-11 (×9): qty 1

## 2018-07-11 MED ORDER — LORAZEPAM 0.5 MG PO TABS
0.2500 mg | ORAL_TABLET | Freq: Two times a day (BID) | ORAL | Status: DC
  Administered 2018-07-11 (×2): 0.25 mg via ORAL
  Filled 2018-07-11 (×2): qty 1

## 2018-07-11 MED ORDER — MIRTAZAPINE 15 MG PO TABS
7.5000 mg | ORAL_TABLET | Freq: Every day | ORAL | Status: DC
  Administered 2018-07-11: 7.5 mg via ORAL
  Filled 2018-07-11: qty 1

## 2018-07-11 MED ORDER — TRAMADOL HCL 50 MG PO TABS
50.0000 mg | ORAL_TABLET | Freq: Four times a day (QID) | ORAL | Status: DC | PRN
  Administered 2018-07-13 – 2018-07-20 (×15): 50 mg via ORAL
  Filled 2018-07-11 (×15): qty 1

## 2018-07-11 NOTE — Evaluation (Signed)
Physical Therapy Evaluation Patient Details Name: Becky Mcmahon MRN: [id number] DOB: 1930-06-14 Today's Date: 07/11/2018   History of Present Illness  85 y.o. female admitted on 07/08/18 from home where she was found to be minimally responsive.  She was hypotensive and anemic in the ED.  Pt found to have hemorrhagic vs septic shock, GI hemorrhage, ABLA s/p 2 units PRBC, and acute metabolic encepholopathy.  Pt with other significant PMH of fall with thoracic compression fx, DM2, CABG, HTN, DVT, CAD, R Breast CA s/p mastectomy, R hip arthroplasty, and aortic valve replacement.    Clinical Impression  Pt is very weak and deconditioned, needing significant assistance to sit EOB today.  Family reports at her most recent baseline she could walk short household distances with the RW and close supervision.  She is unable to even attempt standing as of today.  She will need post acute rehab before returning home where she has her daughter and husband's full time assistance.  PT will continue to follow acutely for safe mobility progression    Follow Up Recommendations SNF(family requests [facility])    Equipment Recommendations  Hospital bed    Recommendations for Other Services   NA    Precautions / Restrictions Precautions Precautions: Fall      Mobility  Bed Mobility Overal bed mobility: Needs Assistance Bed Mobility: Supine to Sit;Sit to Supine;Rolling Rolling: +2 for physical assistance;Max assist   Supine to sit: Mod assist;HOB elevated Sit to supine: HOB elevated;Max assist   General bed mobility comments: Mod to max assist of one to two people for all bed mobility and transition to sitting EOB.  Pt was able to slowly and weakly progress both legs over the side of the bed, but needed significant assist at trunk and pelvis to get to where her feet could touch the floor and she could attempt to sit EOB.  Assist at trunk and legs to return to supine.   Transfers                 General transfer comment: too weak to attempt today.   Ambulation/Gait             General Gait Details: Unable at this time.          Balance Overall balance assessment: Needs assistance Sitting-balance support: Feet supported;Bilateral upper extremity supported Sitting balance-Leahy Scale: Poor Sitting balance - Comments: Pt nearly folded in half in sitting EOB with her ribcage touching her pelvis, kyphotic spine and inability to lift her cervical spine or trunk up to sit upright.  Up to mod assist in sitting EOB.  Pt only tolerated for <5 mins before requesting to lay back down due to back pain.                                      Pertinent Vitals/Pain Pain Assessment: Faces Faces Pain Scale: Hurts whole lot Pain Location: back Pain Descriptors / Indicators: Aching;Discomfort Pain Intervention(s): Limited activity within patient's tolerance;Monitored during session;Repositioned    Home Living Family/patient expects to be discharged to:: Private residence Living Arrangements: Spouse/significant other;Children(husband and daughter.  ) Available Help at Discharge: Family;Available 24 hours/day Type of Home: House Home Access: Stairs to enter Entrance Stairs-Rails: Right;Can reach both;Left Entrance Stairs-Number of Steps: 1 Home Layout: One level Home Equipment: Walker - 2 wheels;Wheelchair - manual;Bedside commode Additional Comments: Pt had [facility] services, but they "expired" per daughter.  Prior Function Level of Independence: Needs assistance   Gait / Transfers Assistance Needed: pt walked limited household distances with RW and close supervision.  Since her fall with her spine compression fx she has been very sedentary.            Hand Dominance   Dominant Hand: Right    Extremity/Trunk Assessment   Upper Extremity Assessment Upper Extremity Assessment: Defer to OT evaluation    Lower Extremity Assessment Lower Extremity  Assessment: RLE deficits/detail;LLE deficits/detail RLE Deficits / Details: bil LE weakness, equal bil, unable to lift legs against gravity.  Weak movement at knees and ankles (grossly 2+/5). LLE Deficits / Details: bil LE weakness, equal bil, unable to lift legs against gravity.  Weak movement at knees and ankles (grossly 2+/5).    Cervical / Trunk Assessment Cervical / Trunk Assessment: Kyphotic;Other exceptions Cervical / Trunk Exceptions: Pt unable to hold head and trunk up off of her pelvis when sitting EOB.  Daugther reports this is worse than baseline.   Communication   Communication: No difficulties  Cognition Arousal/Alertness: Awake/alert Behavior During Therapy: WFL for tasks assessed/performed                                   General Comments: General conversation is normal, fear and anxiety of falling.       General Comments General comments (skin integrity, edema, etc.): VSS throughout session on 2L O2 [place].          Assessment/Plan    PT Assessment Patient needs continued PT services  PT Problem List Decreased strength;Decreased activity tolerance;Decreased balance;Decreased mobility;Decreased knowledge of use of DME;Decreased knowledge of precautions;Pain;Decreased skin integrity       PT Treatment Interventions DME instruction;Gait training;Functional mobility training;Stair training;Therapeutic activities;Balance training;Therapeutic exercise;Patient/family education;Wheelchair mobility training    PT Goals (Current goals can be found in the Care Plan section)  Acute Rehab PT Goals Patient Stated Goal: to decrease back pain and get strong enough to walk so she can go home again.  PT Goal Formulation: With patient/family Time For Goal Achievement: 07/25/18 Potential to Achieve Goals: Fair    Frequency Min 2X/week           AM-PAC PT "6 Clicks" Mobility  Outcome Measure Help needed turning from your back to your side while in a flat bed  without using bedrails?: Total Help needed moving from lying on your back to sitting on the side of a flat bed without using bedrails?: A Lot Help needed moving to and from a bed to a chair (including a wheelchair)?: Total Help needed standing up from a chair using your arms (e.g., wheelchair or bedside chair)?: Total Help needed to walk in hospital room?: Total Help needed climbing 3-5 steps with a railing? : Total 6 Click Score: 7    End of Session Equipment Utilized During Treatment: Oxygen Activity Tolerance: Patient limited by pain Patient left: in bed;with call bell/phone within reach;with family/visitor present   PT Visit Diagnosis: Muscle weakness (generalized) (M62.81);Difficulty in walking, not elsewhere classified (R26.2);Pain Pain - Right/Left: (middle) Pain - part of body: (upper back)    Time: 9485-4627 PT Time Calculation (min) (ACUTE ONLY): 39 min   Charges:          Wells Guiles B. Icy Fuhrmann, PT, DPT  Acute Rehabilitation #[phone] pager #[phone] office   PT Evaluation $PT Eval Moderate Complexity: 1 Mod PT Treatments $Therapeutic Activity: 23-37  mins        07/11/2018, 4:52 PM

## 2018-07-11 NOTE — Progress Notes (Addendum)
Called [facility] --- spoke w/Jodi RN, and notified of Cholride = > 130 and K = 3.0 and UO for this shift so far of = 53 cc  Awaiting orders.

## 2018-07-11 NOTE — Progress Notes (Signed)
  Speech Language Pathology Treatment: Dysphagia  Patient Details Name: Becky Mcmahon MRN: [id number] DOB: 09/21/1930 Today's Date: 07/11/2018 Time: 8768-1157 SLP Time Calculation (min) (ACUTE ONLY): 14 min  Assessment / Plan / Recommendation Clinical Impression  Pt is more alert today compared to description from evaluating SLP and per report of family and RN. They also share that she has been consuming contents of clear liquid trays without overt difficulty, utilizing small bites/sips and avoiding cold liquids, as her daughter says that they make her cough. SLP provided various consistencies with Mod cues provided for smaller bolus sizes and posterior transit/swallow trigger with solids. Coughing was not observed until soft solids were introduced. Recommend advancing diet to soft purees. Family prefers a full liquid diet over a dysphagia 1 diet. Will advance to full liquids and f/u for tolerance and readiness to advance.   HPI HPI: 85 year old female with CAD status post CABG, breast cancer status post mastectomy, hypertension, hyperlipidemia, diabetes, mild dementia, hiatal hernia, DVT, and recent compression fracture of spine. Admitted with shock and altered mental status unclear etiology. Initial concern for possible GI hemorrhage as etiology of shock but per GI no signs of active GI bleeding. Recommended clear liquid diet, advance as tolerated. Pt has remained encephalopathic, CT head 07/08/18 showed no acute abnormality, CXR showed no active disease. Per RN pt was coughing with ice chips.      SLP Plan  Continue with current plan of care       Recommendations  Diet recommendations: Thin liquid;Other(comment)(full liquid diet) Liquids provided via: Cup;Straw Medication Administration: Crushed with puree Supervision: Staff to assist with self feeding;Full supervision/cueing for compensatory strategies Compensations: Slow rate;Small sips/bites Postural Changes and/or Swallow  Maneuvers: Seated upright 90 degrees;Upright 30-60 min after meal                Oral Care Recommendations: Oral care BID Follow up Recommendations: (tba) SLP Visit Diagnosis: Dysphagia, unspecified (R13.10) Plan: Continue with current plan of care       GO                Venita Sheffield Ran Tullis 07/11/2018, 10:45 AM  Pollyann Glen, M.A. [place] Acute Environmental education officer [phone] Office [phone]

## 2018-07-11 NOTE — Progress Notes (Signed)
[facility] TEAM 1 - Stepdown/ICU TEAM  DION PARROW  VZC:[id number] DOB: 07-Aug-1930 DOA: 07/08/2018 PCP: Prince Solian, MD    Brief Narrative:  14IN w/ a hx of CAD s/p CABG, breast CA s/p R mastectomy, DVT on lifelong warfarin, HTN, HLD, mild dementia, cerebral atherosclerosis, and a compression fracture who presented to the ED 2/27 w/ abdominal pain. CT abd was unremarkable. 2/28 she reported some nausea. Her daughter then found her slumped over and minimally responsive. EMS was called and she was transported to the ED where she was found to be hypotensive (SBP 65). Her Hgb at presentation was 9.3.  Significant Events: 2/28 admit by PCCM  Subjective: Alert but confused. Not able to provide a detailed hx or ROS. In no apparent distress. Denies SOB or CP.   Assessment & Plan:  Hemorrhagic v/s septic shock  Shock resolved - hemodynamically stable at this time - no clear source of infection - plan to stop abx after last dose today    Gastrointestinal hemorrhage Black liquid stools were noted after admit - INR 5.2 at admit, w/ recent d/c of PPI as outpt - no plans for EGD at this time - warfarin on hold - cont PPI - no evidence of ongoing bleeding at this time   Acute blood loss anemia S/p 2U PRBC and 2U FFP thus far - Hgb still fluctuating - follow until clear stability achieved   Acute metabolic encephalopathy  suspect due to shock state - CT head unremarkable - remains confused today, but baseline not entirely clear   Hx of DVT Was on lifelong warfarin - remains on hold for now - can consider resuming once Hgb proven to be stable, but may be most prudent to discontinue   AKI Due to shock - improved and plateaud around Cr 1.36 - hydrate and follow   Hypernatremia Due to dehydration - cont free water admin   Hypothyroid Continue Synthroid  Severe back pain, arthritis, history of compression fracture   DVT prophylaxis: SCDs Code Status: LIMITED CODE Family  Communication: spoke w/ husband at bedside   Disposition Plan: stable for tele bed - begin PT/OT - follow lytes and Hgb   Consultants:  [facility]  Antimicrobials:   Doxycycline 2/24 > 2/28 Aztreonam 2/28 Zosyn 2/28 > 3/2 Vancomycin 2/28 > 3/1  Objective: Blood pressure 109/63, pulse 88, temperature 99 F (37.2 C), temperature source Axillary, resp. rate (!) 21, height 5' 4.02" (1.626 m), weight 69 kg, SpO2 100 %.  Intake/Output Summary (Last 24 hours) at 07/11/2018 1146 Last data filed at 07/11/2018 1000 Gross per 24 hour  Intake 1402.64 ml  Output 1198 ml  Net 204.64 ml   Filed Weights   07/09/18 1953 07/09/18 2002 07/10/18 0331  Weight: 65 kg 65 kg 69 kg    Examination: General: No acute respiratory distress Lungs: Clear to auscultation bilaterally without wheezes or crackles Cardiovascular: Regular rate and rhythm without murmur gallop or rub normal S1 and S2 Abdomen: Nontender, nondistended, soft, bowel sounds positive, no rebound, no ascites, no appreciable mass Extremities: No significant cyanosis, clubbing, or edema bilateral lower extremities  CBC: Recent Labs  Lab 07/08/18 1218  07/09/18 1723 07/10/18 0614 07/11/18 0250  WBC 13.2*   < > 19.9* 20.6* 21.3*  NEUTROABS 9.7*  --   --   --   --   HGB 9.3*   < > 12.6 11.9* 10.8*  HCT 32.9*   < > 39.9 37.9 34.2*  MCV 85.2   < >  81.9 82.8 84.0  PLT 261   < > 182 202 173   < > = values in this interval not displayed.   Basic Metabolic Panel: Recent Labs  Lab 07/09/18 0630 07/10/18 0614 07/11/18 0250 07/11/18 0953  NA 148* 154* 156* 154*  K 3.9 3.5 3.0* 3.2*  CL 125* 129* >130* >130*  CO2 13* 15* 19* 16*  GLUCOSE 114* 127* 89 180*  BUN 57* 44* 39* 37*  CREATININE 1.20* 1.36* 1.37* 1.36*  CALCIUM 8.3* 8.5* 8.4* 8.4*  MG 1.9 2.1  --   --   PHOS 3.0  --   --   --    GFR: Estimated Creatinine Clearance: 28.8 mL/min (A) (by C-G formula based on SCr of 1.36 mg/dL (H)).  Liver Function Tests: Recent Labs    Lab 07/07/18 1450 07/08/18 1218  AST 36 61*  ALT 7 32  ALKPHOS 86 63  BILITOT 1.5* 0.5  PROT 7.4 5.4*  ALBUMIN 3.5 2.2*   Recent Labs  Lab 07/07/18 1450  LIPASE 50   Coagulation Profile: Recent Labs  Lab 07/08/18 2231 07/09/18 0619 07/09/18 1138 07/09/18 2355 07/10/18 0614  INR 1.4* 1.2 1.1 1.1 1.1    Cardiac Enzymes: Recent Labs  Lab 07/08/18 1218  TROPONINI 0.03*    Recent Results (from the past 240 hour(s))  Urine Culture     Status: Abnormal   Collection Time: 07/07/18  5:17 PM  Result Value Ref Range Status   Specimen Description   Final    URINE, RANDOM Performed at [facility], [place] [address], [place], [place] [address]    Special Requests   Final    NONE Performed at [facility], [place] [address] [place], [place] [address]    Culture (A)  Final    <10,000 COLONIES/mL INSIGNIFICANT GROWTH Performed at [facility] [address], [place], [place] [address]    Report Status 07/09/2018 FINAL  Final  Culture, blood (routine x 2)     Status: None (Preliminary result)   Collection Time: 07/08/18 12:19 PM  Result Value Ref Range Status   Specimen Description BLOOD LEFT ANTECUBITAL  Final   Special Requests   Final    BOTTLES DRAWN AEROBIC AND ANAEROBIC Blood Culture adequate volume Performed at [facility], [place] [address], [place], [place] [address]    Culture NO GROWTH 3 DAYS  Final   Report Status PENDING  Incomplete  Urine culture     Status: None   Collection Time: 07/08/18 12:33 PM  Result Value Ref Range Status   Specimen Description URINE, CATHETERIZED  Final   Special Requests NONE  Final   Culture   Final    NO GROWTH Performed at [facility], [place] [address] [place], [place] [address]    Report Status 07/09/2018 FINAL  Final  Culture, blood (routine x 2)     Status: None (Preliminary result)   Collection Time: 07/08/18 12:55 PM  Result Value Ref Range Status   Specimen  Description BLOOD RIGHT WRIST  Final   Special Requests   Final    BOTTLES DRAWN AEROBIC AND ANAEROBIC Blood Culture results may not be optimal due to an inadequate volume of blood received in culture bottles Performed at [facility], [place] [address], [place], [place] [address]    Culture NO GROWTH 3 DAYS  Final   Report Status PENDING  Incomplete  MRSA PCR Screening  Status: None   Collection Time: 07/09/18  3:00 AM  Result Value Ref Range Status   MRSA by PCR NEGATIVE NEGATIVE Final    Comment:        The GeneXpert MRSA Assay (FDA approved for NASAL specimens only), is one component of a comprehensive MRSA colonization surveillance program. It is not intended to diagnose MRSA infection nor to guide or monitor treatment for MRSA infections. Performed at [facility], [place] [address] [place], [place] [address]      Scheduled Meds: . insulin aspart  0-9 Units Subcutaneous Q4H  . levothyroxine  25 mcg Intravenous Daily  . pantoprazole (PROTONIX) IV  40 mg Intravenous Q12H     LOS: 3 days   Cherene Altes, MD [facility] Office  [phone] Pager - Text Page per Amion  If 7PM-7AM, please contact night-coverage per Amion 07/11/2018, 11:46 AM

## 2018-07-11 NOTE — Progress Notes (Signed)
[facility] Progress Note Patient Name: Becky Mcmahon DOB: 09-May-1931 MRN: [id number]   Date of Service  07/11/2018  HPI/Events of Note  Low K, high sod and chloride. Discussed with bed side RN.   eICU Interventions  Kcl q1 hr Kcl IV x 32 doses.  Start D5water at 50 ml/hr. Follow BMP at 10 AM.      Intervention Category Intermediate Interventions: Electrolyte abnormality - evaluation and management  Elmer Sow 07/11/2018, 6:27 AM

## 2018-07-12 LAB — COMPREHENSIVE METABOLIC PANEL
ALT: 26 U/L (ref 0–44)
AST: 26 U/L (ref 15–41)
Albumin: 2 g/dL — ABNORMAL LOW (ref 3.5–5.0)
Alkaline Phosphatase: 72 U/L (ref 38–126)
Anion gap: 8 (ref 5–15)
BUN: 28 mg/dL — ABNORMAL HIGH (ref 8–23)
CHLORIDE: 123 mmol/L — AB (ref 98–111)
CO2: 16 mmol/L — ABNORMAL LOW (ref 22–32)
Calcium: 8.2 mg/dL — ABNORMAL LOW (ref 8.9–10.3)
Creatinine, Ser: 1.26 mg/dL — ABNORMAL HIGH (ref 0.44–1.00)
GFR calc Af Amer: 45 mL/min — ABNORMAL LOW (ref >60)
GFR calc non Af Amer: 39 mL/min — ABNORMAL LOW (ref >60)
Glucose, Bld: 137 mg/dL — ABNORMAL HIGH (ref 70–99)
Potassium: 3.2 mmol/L — ABNORMAL LOW (ref 3.5–5.1)
SODIUM: 147 mmol/L — AB (ref 135–145)
Total Bilirubin: 0.7 mg/dL (ref 0.3–1.2)
Total Protein: 4.6 g/dL — ABNORMAL LOW (ref 6.5–8.1)

## 2018-07-12 LAB — CBC
HCT: 33.7 % — ABNORMAL LOW (ref 36.0–46.0)
Hemoglobin: 10.8 g/dL — ABNORMAL LOW (ref 12.0–15.0)
MCH: 27.3 pg (ref 26.0–34.0)
MCHC: 32 g/dL (ref 30.0–36.0)
MCV: 85.3 fL (ref 80.0–100.0)
Platelets: UNDETERMINED 10*3/uL (ref 150–400)
RBC: 3.95 MIL/uL (ref 3.87–5.11)
RDW: 18 % — ABNORMAL HIGH (ref 11.5–15.5)
WBC: 16.2 10*3/uL — ABNORMAL HIGH (ref 4.0–10.5)
nRBC: 0.3 % — ABNORMAL HIGH (ref 0.0–0.2)

## 2018-07-12 LAB — GLUCOSE, CAPILLARY
Glucose-Capillary: 115 mg/dL — ABNORMAL HIGH (ref 70–99)
Glucose-Capillary: 148 mg/dL — ABNORMAL HIGH (ref 70–99)

## 2018-07-12 MED ORDER — WARFARIN - PHARMACIST DOSING INPATIENT
Freq: Every day | Status: DC
  Administered 2018-07-15 – 2018-07-17 (×3)

## 2018-07-12 MED ORDER — WARFARIN SODIUM 2.5 MG PO TABS
2.5000 mg | ORAL_TABLET | Freq: Once | ORAL | Status: AC
  Administered 2018-07-12: 2.5 mg via ORAL
  Filled 2018-07-12: qty 1

## 2018-07-12 MED ORDER — CALCITONIN (SALMON) 200 UNIT/ACT NA SOLN
1.0000 | Freq: Every day | NASAL | Status: DC
  Administered 2018-07-12 – 2018-07-20 (×9): 1 via NASAL
  Filled 2018-07-12 (×2): qty 3.7

## 2018-07-12 NOTE — Progress Notes (Signed)
Occupational Therapy Evaluation Patient Details Name: NGA RABON MRN: [id number] DOB: 09/25/1930 Today's Date: 07/12/2018    History of Present Illness 85 y.o. female admitted on 07/08/18 from home where she was found to be minimally responsive.  She was hypotensive and anemic in the ED.  Pt found to have hemorrhagic vs septic shock, GI hemorrhage, ABLA s/p 2 units PRBC, and acute metabolic encepholopathy.  Pt with other significant PMH of fall with thoracic compression fx, DM2, CABG, HTN, DVT, CAD, R Breast CA s/p mastectomy, R hip arthroplasty, and aortic valve replacement.     Clinical Impression   PTA, pt lived at home with her husband and required assistance with ADL. Pt ambulated with RW and had a fear of falling since her fall in October per family. Pt will benefit form rehab at SNF to maximize functional level of independence of facilitate retur home with husband. Will follow acutely.    Follow Up Recommendations  SNF;Supervision/Assistance - 24 hour    Equipment Recommendations  Other (comment)(TBA)    Recommendations for Other Services       Precautions / Restrictions Precautions Precautions: Fall Precaution Comments: at risk for skin breakdown      Mobility Bed Mobility Overal bed mobility: Needs Assistance   Rolling: Max assist;+2 for physical assistance         General bed mobility comments: only tolerated rolling this session  Transfers                 General transfer comment: too weak to attempt today.     Balance Overall balance assessment: Needs assistance   Sitting balance-Leahy Scale: Poor Sitting balance - Comments: R lateral lean in bed                                   ADL either performed or assessed with clinical judgement   ADL Overall ADL's : Needs assistance/impaired Eating/Feeding: Minimal assistance   Grooming: Moderate assistance;Bed level   Upper Body Bathing: Moderate assistance;Bed level   Lower  Body Bathing: Total assistance;Bed level   Upper Body Dressing : Maximal assistance;Bed level   Lower Body Dressing: Total assistance;Bed level               Functional mobility during ADLs: (not attmepted due to need for +2 and pt's pain level)       Vision         Perception     Praxis      Pertinent Vitals/Pain Pain Assessment: Faces Faces Pain Scale: Hurts whole lot Pain Location: back Pain Descriptors / Indicators: Aching;Discomfort Pain Intervention(s): Limited activity within patient's tolerance;Repositioned     Hand Dominance Right   Extremity/Trunk Assessment Upper Extremity Assessment Upper Extremity Assessment: Generalized weakness   Lower Extremity Assessment Lower Extremity Assessment: Defer to PT evaluation   Cervical / Trunk Assessment Cervical / Trunk Assessment: Kyphotic;Other exceptions Cervical / Trunk Exceptions: Pt unable to hold head and trunk up off of her pelvis when sitting EOB.  Daugther reports this is worse than baseline. Hx of compression fractures   Communication Communication Communication: No difficulties   Cognition Arousal/Alertness: Awake/alert Behavior During Therapy: WFL for tasks assessed/performed Overall Cognitive Status: Impaired/Different from baseline                                 General Comments: General conversation  is normal, fear and anxiety of falling. Cognitioin most likely affected by medicaitoin but not at baseline. will further assess   General Comments       Exercises Exercises: Other exercises Other Exercises Other Exercises: encouraged pt's family to complete BUE A/AA/PROM through full ROM x 20 3 x/day Other Exercises: Discussed importance of elevating B heels   Shoulder Instructions      Home Living Family/patient expects to be discharged to:: Skilled nursing facility Living Arrangements: Spouse/significant other;Children(husband and daughter.  ) Available Help at Discharge:  Family;Available 24 hours/day Type of Home: House Home Access: Stairs to enter [employer] of Steps: 1 Entrance Stairs-Rails: Right;Can reach both;Left Home Layout: One level     Bathroom Shower/Tub: Other (comment)(pt has been sponge bathing recently with aids)   Bathroom Toilet: Standard     Home Equipment: Environmental consultant - 2 wheels;Wheelchair - manual;Bedside commode   Additional Comments: Pt had [facility] services, but they "expired" per daughter.       Prior Functioning/Environment Level of Independence: Needs assistance  Gait / Transfers Assistance Needed: pt walked limited household distances with RW and close supervision.  Since her fall with her spine compression fx she has been very sedentary.  ADL's / Homemaking Assistance Needed: husband assisted wtih ADL as needed            OT Problem List: Decreased strength;Decreased range of motion;Decreased activity tolerance;Impaired balance (sitting and/or standing);Decreased coordination;Decreased safety awareness;Decreased knowledge of use of DME or AE;Decreased cognition;Cardiopulmonary status limiting activity;Pain      OT Treatment/Interventions: Self-care/ADL training;Therapeutic exercise;Energy conservation;DME and/or AE instruction;Therapeutic activities;Cognitive remediation/compensation;Patient/family education;Balance training    OT Goals(Current goals can be found in the care plan section) Acute Rehab OT Goals Patient Stated Goal: to decrease back pain and get strong enough to walk so she can go home again.  OT Goal Formulation: With patient/family Time For Goal Achievement: 07/26/18 Potential to Achieve Goals: Good  OT Frequency: Min 2X/week   Barriers to D/C:            Co-evaluation              AM-PAC OT "6 Clicks" Daily Activity     Outcome Measure Help from another person eating meals?: A Lot Help from another person taking care of personal grooming?: A Lot Help from another person toileting,  which includes using toliet, bedpan, or urinal?: Total Help from another person bathing (including washing, rinsing, drying)?: A Lot Help from another person to put on and taking off regular upper body clothing?: A Lot Help from another person to put on and taking off regular lower body clothing?: Total 6 Click Score: 10   End of Session Nurse Communication: Mobility status  Activity Tolerance: Patient limited by pain;Patient limited by fatigue Patient left: in bed;with call bell/phone within reach;with bed alarm set;with family/visitor present;with SCD's reapplied  OT Visit Diagnosis: Other abnormalities of gait and mobility (R26.89);Muscle weakness (generalized) (M62.81);Cognitive communication deficit (R41.841);Pain Pain - part of body: (back; all over)                Time: 9622-2979 OT Time Calculation (min): 21 min Charges:  OT General Charges $OT Visit: 1 Visit OT Evaluation $OT Eval Moderate Complexity: [place], OT/L   Acute OT Clinical Specialist [place] Pager [phone] Office [phone]   [facility] 07/12/2018, 6:08 PM

## 2018-07-12 NOTE — Progress Notes (Signed)
ANTICOAGULATION CONSULT NOTE - Initial Consult  Pharmacy Consult for Coumadin Indication: hx DVT, on lifelong anticoagulation  Allergies  Allergen Reactions  . Contrast Media  [Iodinated Diagnostic Agents] Itching  . Keflex [Cephalexin] Rash    Severe itching  . Latex Rash  . Neosporin [Neomycin-Bacitracin Zn-Polymyx] Rash    Patient Measurements: Height: 5' 4.02" (162.6 cm) Weight: 142 lb 6.7 oz (64.6 kg) IBW/kg (Calculated) : 54.74  Vital Signs: Temp: 98.3 F (36.8 C) (03/03 1558) Temp Source: Oral (03/03 1558) BP: 145/68 (03/03 1558) Pulse Rate: 96 (03/03 1558)  Labs: Recent Labs    07/09/18 2355  07/10/18 6045 07/11/18 0250 07/11/18 0953 07/11/18 1645 07/12/18 0343  HGB  --   --  11.9* 10.8*  --  11.3* 10.8*  HCT  --   --  37.9 34.2*  --  35.9* 33.7*  PLT  --   --  202 173  --  197 PLATELET CLUMPS NOTED ON SMEAR, UNABLE TO ESTIMATE  LABPROT 13.9  --  14.1  --   --   --   --   INR 1.1  --  1.1  --   --   --   --   CREATININE  --    < > 1.36* 1.37* 1.36*  --  1.26*   < > = values in this interval not displayed.    Estimated Creatinine Clearance: 28.2 mL/min (A) (by C-G formula based on SCr of 1.26 mg/dL (H)).   Medical History: Past Medical History:  Diagnosis Date  . Anxiety   . Arthritis   . Breast cancer ([place])    Rt mastectomy  . CAD (coronary artery disease)   . Cerebral atherosclerosis    CAROTID DOPPLER, 06/18/2009 - RIGHT/LEFT BULB AND PROXIMAL ICAs-0-49% diameter reduction  . DVT (deep venous thrombosis) ([facility])    Recurrent, 1st episode after CABG 1997, 2nd episode after hip surgery 2001, placed on life-long coumadin ever since; LEXISCAN, 03/13/2008 - normal, no ECG changes, EKG negative for ischemia  . GERD (gastroesophageal reflux disease)   . Guaiac positive stools   . Hypercholesteremia   . Hyperlipidemia   . Hypertension   . Hypothyroidism   . Osteoporosis   . Ovarian cyst   . Pancreatitis   . S/P CABG x 5 10/25/1995   LIMA to LAD,  sequential SVG to RI and OM, sequential SVG to RCA and PDA, open vein harvest from both thighs and legs - Dr Prescott Gum  . Severe aortic stenosis    2D ECHO, 04/22/2012 - EF 60-65%, aortic valve-heavily calcified with restricted leaflet motion, left atrium severly dilated  . Swelling of right lower extremity    LEA VENOUS DUPLEX SCAN, 08/18/2011 - no evidence of acute thrombus or thrombophlebitis  . Type II diabetes mellitus (H[facility])    Diet controlled  . Vitamin D deficiency    Assessment:  85 yr old female on lifelong Coumadin PTA for hx DVT x 2.  Admitted 2/28 with GI bleed and hemorrhagic shock.  INR was 5.2. Coumadin reversed with KCentra, FFP and Vitamin K. No evidence of ongoing bleeding. Coumadin to resume today without bridge.  Last INR 1.1 on 07/10/18.  Continues on Protonix 40 mg IV q12h.      PTA Coumadin regimen:  5 mg MWFSat, 2.5 mg TTSun.  INR 5.2 on admit 2/28  Goal of Therapy:  INR 2-3 Monitor platelets by anticoagulation protocol: Yes   Plan:   Will resume Coumadin conservatively with 2.5 mg x 1  today  Daily PT/INR.  Monitor for any s/sx bleeding.  Arty Baumgartner, [place] Pager: [phone] or phone: [phone] 07/12/2018,4:01 PM

## 2018-07-12 NOTE — NC FL2 (Signed)
[place] LEVEL OF CARE SCREENING TOOL     IDENTIFICATION  Patient Name: Becky Mcmahon Birthdate: 1930/08/11 Sex: female Admission Date (Current Location): 07/08/2018  [facility] and [place] Number:  Herbalist and Address:  [facility], [place] [address], [place], [place] [address]      Provider Number: [id number]  Attending Physician Name and Address:  Cherene Altes, MD  Relative Name and Phone Number:       Current Level of Care: Hospital Recommended Level of Care: [place] Prior Approval Number:    Date Approved/Denied:   PASRR Number: [id number]  Discharge Plan: SNF    Current Diagnoses: Patient Active Problem List   Diagnosis Date Noted  . Shock ([place]) 07/08/2018  . Pressure injury of skin 07/08/2018  . Encephalopathy acute   . 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 Examination of participant in clinical trial 02/17/2013  . Breast cancer ([place]) 08/08/2012  . GERD (gastroesophageal reflux disease) 08/08/2012  . Hypothyroidism, unspecified 08/08/2012  . Osteoporosis 08/08/2012  . Aortic valve disorders 05/27/2012  . 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[place]) 05/27/2012  . Arthritis 05/27/2012  . HTN (hypertension) 05/27/2012  . Hyperlipidemia, unspecified 05/27/2012  . DVT (deep venous thrombosis) ([place])   . AS (aortic stenosis) 05/24/2012  . CAD (coronary artery disease) 05/24/2012  . S/P CABG x 5 10/25/1995    Orientation RESPIRATION BLADDER Height & Weight     Self  O2(Nasal Cannula 2.5L) Indwelling catheter, Incontinent(urethal cath placed 07/09/18) Weight: 142 lb 6.7 oz (64.6 kg) Height:  5' 4.02" (162.6 cm)  BEHAVIORAL SYMPTOMS/MOOD NEUROLOGICAL BOWEL NUTRITION STATUS      Incontinent(rectal tube placed 07/09/18) Diet(DYS 1 diet, thin liquids)  AMBULATORY STATUS COMMUNICATION OF NEEDS Skin   Extensive Assist Verbally PU Stage and Appropriate Care(Pressure inury on sacrum,  foam dressing, change every 3 days.  PU stage 2 on toe, guaze/foam dressing, change daily. )                       Personal Care Assistance Level of Assistance  Bathing, Feeding, Dressing Bathing Assistance: Maximum assistance Feeding assistance: Independent Dressing Assistance: Maximum assistance     Functional Limitations Info  Sight, Hearing, Speech Sight Info: Adequate Hearing Info: Adequate      SPECIAL CARE FACTORS FREQUENCY  PT (By licensed PT), OT (By licensed OT)     PT Frequency: 2x OT Frequency: 2x            Contractures Contractures Info: Not present    Additional Factors Info  Code Status, Allergies Code Status Info: Partial Allergies Info: Contrast Media  Iodinated Diagnostic Agents, Keflex Cephalexin, Latex, Neosporin Neomycin-bacitracin Zn-polymyx           Current Medications (07/12/2018):  This is the current hospital active medication list Current Facility-Administered Medications  Medication Dose Route Frequency Provider Last Rate Last Dose  . acetaminophen (TYLENOL) tablet 650 mg  650 mg Oral Q6H PRN Cherene Altes, MD      . dextrose 5 % solution   Intravenous Continuous Cherene Altes, MD 75 mL/hr at 07/12/18 0615    . donepezil (ARICEPT) tablet 5 mg  5 mg Oral QHS Cherene Altes, MD   5 mg at 07/11/18 2249  . insulin aspart (novoLOG) injection 0-5 Units  0-5 Units Subcutaneous QHS Joette Catching T, MD      . insulin aspart (novoLOG) injection 0-9 Units  0-9 Units Subcutaneous TID WC Cherene Altes, MD   1 Units at 07/11/18 1723  . levothyroxine (SYNTHROID, LEVOTHROID) tablet 50 mcg  50 mcg Oral QAC breakfast Cherene Altes, MD   50 mcg at 07/12/18 [phone]  . LORazepam (ATIVAN) tablet 0.25 mg  0.25 mg Oral BID Cherene Altes, MD   0.25 mg at 07/11/18 2250  . mirtazapine (REMERON) tablet 7.5 mg  7.5 mg Oral QHS Cherene Altes, MD   7.5 mg at 07/11/18 2250  . morphine 2 MG/ML injection 2 mg  2 mg Intravenous Q3H PRN  Cherene Altes, MD   2 mg at 07/11/18 2301  . pantoprazole (PROTONIX) injection 40 mg  40 mg Intravenous Q12H Collene Gobble, MD   40 mg at 07/12/18 0909  . traMADol (ULTRAM) tablet 50 mg  50 mg Oral Q6H PRN Cherene Altes, MD         Discharge Medications: Please see discharge summary for a list of discharge medications.  Relevant Imaging Results:  Relevant Lab Results:   Additional Information SSN: [id number]  Eileen Stanford, LCSW

## 2018-07-12 NOTE — Clinical Social Work Note (Signed)
Clinical Social Work Assessment  Patient Details  Name: Becky Mcmahon MRN: [id number] Date of Birth: 03/24/1931  Date of referral:  07/12/18               Reason for consult:  Facility Placement                Permission sought to share information with:  Chartered certified accountant granted to share information::  Yes, Verbal Permission Granted  Name::     Publishing rights manager::  [facility]  Relationship::  Daughter  Contact Information:     Housing/Transportation Living arrangements for the past 2 months:  Single Family Home Source of Information:  Adult Children Patient Interpreter Needed:  None Criminal Activity/Legal Involvement Pertinent to Current Situation/Hospitalization:  No - Comment as needed Significant Relationships:  Adult Children, Spouse Lives with:  Adult Children, Spouse Do you feel safe going back to the place where you live?  No Need for family participation in patient care:  No (Coment)  Care giving concerns:  Pt is only alert to self. CSW spoke with pt's daughter to discuss d/c plan.   Social Worker assessment / plan:  CSW spoke with pt's daughter via telephone. Pt lives with spouse and daughter, Becky Mcmahon. Pt's daughter, Becky Mcmahon confirmed family wants pt to go to [facility]. CSW will reach out to facility to see if they can take pt.  Employment status:  Retired Nurse, adult PT Recommendations:  [place] / Referral to community resources:  [place]  Patient/Family's Response to care:  Pt's daughter verbalized understanding of CSW role and expressed appreciation for support. Pt's daughter denies any concern regarding pt care at this time.   Patient/Family's Understanding of and Emotional Response to Diagnosis, Current Treatment, and Prognosis:  Pt's daughter understanding and realistic regarding pt's physical limitations. Pt's daughter understands the need for SNF  placement at d/c. Pt's daughter agreeable to pt's SNF placement at d/c, at this time. Pt's daughter denies any concern regarding pt's treatment plan at this time. CSW will continue to provide support and facilitate d/c needs.   Emotional Assessment Appearance:  Appears stated age Attitude/Demeanor/Rapport:  Unable to Assess Affect (typically observed):  Unable to Assess Orientation:  Oriented to Self Alcohol / Substance use:  Not Applicable Psych involvement (Current and /or in the community):  No (Comment)  Discharge Needs  Concerns to be addressed:  Care Coordination, Basic Needs Readmission within the last 30 days:  No Current discharge risk:  Dependent with Mobility Barriers to Discharge:  Continued Medical Work up, [employer], LCSW 07/12/2018, 10:03 AM

## 2018-07-12 NOTE — Progress Notes (Signed)
  Speech Language Pathology Treatment: Dysphagia  Patient Details Name: Becky Mcmahon MRN: [id number] DOB: Jul 10, 1930 Today's Date: 07/12/2018 Time: 0034-9179 SLP Time Calculation (min) (ACUTE ONLY): 16 min  Assessment / Plan / Recommendation Clinical Impression  Skilled treatment session focused on dysphagia goals and education with pt's daughters. Upon entering room, pt's daughter reports that pt "just had morphine and was very fatigued." Pt was able to interact with SLP, maintain attention appropriately. Clinically, pt appeared appropriate for trials of advanced PO textures. This Probation officer consulted pt's nurse who provides that pt was administered morphine over night but not recently. SLP further facilitated session by providing skilled observation of pt consuming puree with mild oral hold (typical for pt) and no decline in respiratory ability. Pt's respiratory breathing was typical for pt according to daughter's. Therefore consumption of purees didn't compromise pt's respiratory status. Education provided to family on recommendation to advance diet to dysphagia 1 with tin liquids. All were agreeable and education provided to pt's nurse to administer medication whole in puree or crushed in puree. ST to follow for further advancement.    HPI HPI: 85 year old female with CAD status post CABG, breast cancer status post mastectomy, hypertension, hyperlipidemia, diabetes, mild dementia, hiatal hernia, DVT, and recent compression fracture of spine. Admitted with shock and altered mental status unclear etiology. Initial concern for possible GI hemorrhage as etiology of shock but per GI no signs of active GI bleeding. Recommended clear liquid diet, advance as tolerated. Pt has remained encephalopathic, CT head 07/08/18 showed no acute abnormality, CXR showed no active disease. Per RN pt was coughing with ice chips.      SLP Plan  Continue with current plan of care       Recommendations  Diet  recommendations: Dysphagia 1 (puree);Thin liquid Liquids provided via: Cup;Straw Medication Administration: Whole meds with puree Supervision: Staff to assist with self feeding;Full supervision/cueing for compensatory strategies Compensations: Slow rate;Small sips/bites Postural Changes and/or Swallow Maneuvers: Seated upright 90 degrees;Upright 30-60 min after meal                Oral Care Recommendations: Oral care BID Follow up Recommendations: Skilled Nursing facility SLP Visit Diagnosis: Dysphagia, unspecified (R13.10) Plan: Continue with current plan of care       [place] 07/12/2018, 9:18 AM

## 2018-07-12 NOTE — Progress Notes (Signed)
[facility] TEAM 1 - Stepdown/ICU TEAM  TSERING LEAMAN  VZD:[id number] DOB: 03/06/1931 DOA: 07/08/2018 PCP: Prince Solian, MD    Brief Narrative:  31JJ w/ a hx of CAD s/p CABG, TAVR 2014 ([facility]) breast CA s/p R mastectomy, recurrent DVTs, HTN, HLD, mild dementia, cerebral atherosclerosis, and a compression fracture who presented to the ED 2/27 w/ abdominal pain. CT abd was unremarkable. 2/28 she reported some nausea. Her daughter then found her slumped over and minimally responsive. EMS was called and she was transported to the ED where she was found to be hypotensive (SBP 65). Her Hgb at presentation was 9.3.  Significant Events: 2/28 admit by PCCM  Subjective: More sedate/confused today. Family relates this to a dose of IV morphine given ~11PM last night. She does not appear uncomfortable. She is not in resp distress.  Assessment & Plan:  Hemorrhagic v/s septic shock  Shock resolved - hemodynamically stable at this time - no clear source of infection - now off abx - follow clinically - afebrile w/ declining WBC   Gastrointestinal hemorrhage Black liquid stools were noted after admit - INR 5.2 at admit, w/ recent d/c of PPI as outpt - no plans for EGD at this time - cont PPI - no evidence of ongoing bleeding at this time - follow as warfarin resumed w/o bridge  Acute blood loss anemia S/p 2U PRBC and 2U FFP thus far - Hgb appears to be stabilizing - follow w/ resumption of warfarin   Acute metabolic encephalopathy  suspect due to shock state - CT head unremarkable - had improved significantly as of 3/2, but now sedate again today - stop or minimize all sedating meds and follow   S/P TAVR 2014 At [facility] - 84mm core valve - should not need long term anticoag for this   Hx of DVT Was on lifelong warfarin due to 2 episodes of DVT (the first around the time of her CABG recovery, but the second sounds more unprovoked) - will resume anticoag while still in hospital and watch for repeat  bleeding   AKI Due to shock - improved and plateaud around Cr 1.36 - crt is on a downward trend at this time    Hypernatremia Due to dehydration w/ poor oral intake in setting of sesdation - cont free water admin per IV  Hypothyroid Continue Synthroid  Severe back pain, arthritis, history of compression fracture T7/L1 Discussed need for PT/OT w/ family - do not feel MRI would add to her care as she is not a candidate for a back surgery - add calcitonin nasal spray for 2-4 week course    DVT prophylaxis: SCDs Code Status: LIMITED CODE Family Communication: spoke w/ husband and 2 daughters at bedside   Disposition Plan: stable for tele bed - begin PT/OT - follow lytes and Hgb - resume anticoag   Consultants:  [facility]  Antimicrobials:   Doxycycline 2/24 > 2/28 Aztreonam 2/28 Zosyn 2/28 > 3/2 Vancomycin 2/28 > 3/1  Objective: Blood pressure (!) 148/75, pulse 92, temperature 98.3 F (36.8 C), temperature source Oral, resp. rate (!) 24, height 5' 4.02" (1.626 m), weight 64.6 kg, SpO2 98 %.  Intake/Output Summary (Last 24 hours) at 07/12/2018 1501 Last data filed at 07/12/2018 1000 Gross per 24 hour  Intake 1255.26 ml  Output 1400 ml  Net -144.74 ml   Filed Weights   07/09/18 2002 07/10/18 0331 07/12/18 0400  Weight: 65 kg 69 kg 64.6 kg    Examination: General: No  acute respiratory distress Lungs: Clear to auscultation bilaterally - no wheeze or crackles  Cardiovascular: Regular rate and rhythm - no M or rub or click  Abdomen: Nontender, nondistended, soft, bowel sounds positive, no rebound Extremities: No signif edema bilateral lower extremities  CBC: Recent Labs  Lab 07/08/18 1218  07/11/18 0250 07/11/18 1645 07/12/18 0343  WBC 13.2*   < > 21.3* 20.7* 16.2*  NEUTROABS 9.7*  --   --   --   --   HGB 9.3*   < > 10.8* 11.3* 10.8*  HCT 32.9*   < > 34.2* 35.9* 33.7*  MCV 85.2   < > 84.0 83.7 85.3  PLT 261   < > 173 197 PLATELET CLUMPS NOTED ON SMEAR, UNABLE TO  ESTIMATE   < > = values in this interval not displayed.   Basic Metabolic Panel: Recent Labs  Lab 07/09/18 0630 07/10/18 0614 07/11/18 0250 07/11/18 0953 07/12/18 0343  NA 148* 154* 156* 154* 147*  K 3.9 3.5 3.0* 3.2* 3.2*  CL 125* 129* >130* >130* 123*  CO2 13* 15* 19* 16* 16*  GLUCOSE 114* 127* 89 180* 137*  BUN 57* 44* 39* 37* 28*  CREATININE 1.20* 1.36* 1.37* 1.36* 1.26*  CALCIUM 8.3* 8.5* 8.4* 8.4* 8.2*  MG 1.9 2.1  --   --   --   PHOS 3.0  --   --   --   --    GFR: Estimated Creatinine Clearance: 28.2 mL/min (A) (by C-G formula based on SCr of 1.26 mg/dL (H)).  Liver Function Tests: Recent Labs  Lab 07/07/18 1450 07/08/18 1218 07/12/18 0343  AST 36 61* 26  ALT 7 32 26  ALKPHOS 86 63 72  BILITOT 1.5* 0.5 0.7  PROT 7.4 5.4* 4.6*  ALBUMIN 3.5 2.2* 2.0*   Recent Labs  Lab 07/07/18 1450  LIPASE 50   Coagulation Profile: Recent Labs  Lab 07/08/18 2231 07/09/18 0619 07/09/18 1138 07/09/18 2355 07/10/18 0614  INR 1.4* 1.2 1.1 1.1 1.1    Cardiac Enzymes: Recent Labs  Lab 07/08/18 1218  TROPONINI 0.03*    Recent Results (from the past 240 hour(s))  Urine Culture     Status: Abnormal   Collection Time: 07/07/18  5:17 PM  Result Value Ref Range Status   Specimen Description   Final    URINE, RANDOM Performed at [facility], [place] [address] [place], [place] [address]    Special Requests   Final    NONE Performed at [facility], [place] [address], [place], [place] [address]    Culture (A)  Final    <10,000 COLONIES/mL INSIGNIFICANT GROWTH Performed at [place] [address] [place], [place] [address]    Report Status 07/09/2018 FINAL  Final  Culture, blood (routine x 2)     Status: None (Preliminary result)   Collection Time: 07/08/18 12:19 PM  Result Value Ref Range Status   Specimen Description BLOOD LEFT ANTECUBITAL  Final   Special Requests   Final    BOTTLES DRAWN AEROBIC AND ANAEROBIC  Blood Culture adequate volume   Culture   Final    NO GROWTH 4 DAYS Performed at [facility], [place] [address], [place], [place] [address]    Report Status PENDING  Incomplete  Urine culture     Status: None   Collection Time: 07/08/18 12:33 PM  Result Value Ref Range Status   Specimen Description URINE, CATHETERIZED  Final   Special  Requests NONE  Final   Culture   Final    NO GROWTH Performed at [facility], [place] [address], [place], [place] [address]    Report Status 07/09/2018 FINAL  Final  Culture, blood (routine x 2)     Status: None (Preliminary result)   Collection Time: 07/08/18 12:55 PM  Result Value Ref Range Status   Specimen Description BLOOD RIGHT WRIST  Final   Special Requests   Final    BOTTLES DRAWN AEROBIC AND ANAEROBIC Blood Culture results may not be optimal due to an inadequate volume of blood received in culture bottles   Culture   Final    NO GROWTH 4 DAYS Performed at [facility], [place] [address] [place], [place] [address]    Report Status PENDING  Incomplete  MRSA PCR Screening     Status: None   Collection Time: 07/09/18  3:00 AM  Result Value Ref Range Status   MRSA by PCR NEGATIVE NEGATIVE Final    Comment:        The GeneXpert MRSA Assay (FDA approved for NASAL specimens only), is one component of a comprehensive MRSA colonization surveillance program. It is not intended to diagnose MRSA infection nor to guide or monitor treatment for MRSA infections. Performed at [facility], [place] [address], [place], [place] [address]      Scheduled Meds: . donepezil  5 mg Oral QHS  . insulin aspart  0-5 Units Subcutaneous QHS  . insulin aspart  0-9 Units Subcutaneous TID WC  . levothyroxine  50 mcg Oral QAC breakfast  . LORazepam  0.25 mg Oral BID  . mirtazapine  7.5 mg Oral QHS  . pantoprazole (PROTONIX) IV  40 mg Intravenous Q12H     LOS: 4 days   Cherene Altes, MD [facility] Office  [phone] Pager  - Text Page per Amion  If 7PM-7AM, please contact night-coverage per Amion 07/12/2018, 3:01 PM

## 2018-07-12 NOTE — Clinical Social Work Note (Signed)
[facility] has confirmed they can accommodate pt and has offered a bed. Daughter, Lenna Sciara, made aware. [facility] starting [facility] authorization at this time.  [place], [place]

## 2018-07-13 ENCOUNTER — Inpatient Hospital Stay (HOSPITAL_COMMUNITY): Payer: Medicare Other

## 2018-07-13 DIAGNOSIS — R6521 Severe sepsis with septic shock: Secondary | ICD-10-CM

## 2018-07-13 DIAGNOSIS — A419 Sepsis, unspecified organism: Principal | ICD-10-CM

## 2018-07-13 LAB — CBC WITH DIFFERENTIAL/PLATELET
Abs Immature Granulocytes: 0.21 10*3/uL — ABNORMAL HIGH (ref 0.00–0.07)
Basophils Absolute: 0 10*3/uL (ref 0.0–0.1)
Basophils Relative: 0 %
Eosinophils Absolute: 0.1 10*3/uL (ref 0.0–0.5)
Eosinophils Relative: 1 %
HCT: 35.2 % — ABNORMAL LOW (ref 36.0–46.0)
Hemoglobin: 11.2 g/dL — ABNORMAL LOW (ref 12.0–15.0)
Immature Granulocytes: 1 %
LYMPHS PCT: 5 %
Lymphs Abs: 0.9 10*3/uL (ref 0.7–4.0)
MCH: 26.3 pg (ref 26.0–34.0)
MCHC: 31.8 g/dL (ref 30.0–36.0)
MCV: 82.6 fL (ref 80.0–100.0)
Monocytes Absolute: 1 10*3/uL (ref 0.1–1.0)
Monocytes Relative: 5 %
NEUTROS PCT: 88 %
Neutro Abs: 16.7 10*3/uL — ABNORMAL HIGH (ref 1.7–7.7)
Platelets: 156 10*3/uL (ref 150–400)
RBC: 4.26 MIL/uL (ref 3.87–5.11)
RDW: 17.4 % — ABNORMAL HIGH (ref 11.5–15.5)
WBC: 19 10*3/uL — ABNORMAL HIGH (ref 4.0–10.5)
nRBC: 0 % (ref 0.0–0.2)

## 2018-07-13 LAB — BASIC METABOLIC PANEL
Anion gap: 7 (ref 5–15)
BUN: 16 mg/dL (ref 8–23)
CO2: 21 mmol/L — ABNORMAL LOW (ref 22–32)
CREATININE: 0.87 mg/dL (ref 0.44–1.00)
Calcium: 8.1 mg/dL — ABNORMAL LOW (ref 8.9–10.3)
Chloride: 117 mmol/L — ABNORMAL HIGH (ref 98–111)
GFR calc Af Amer: >60 mL/min (ref >60)
GFR calc non Af Amer: >60 mL/min (ref >60)
GLUCOSE: 135 mg/dL — AB (ref 70–99)
Potassium: 3.5 mmol/L (ref 3.5–5.1)
Sodium: 145 mmol/L (ref 135–145)

## 2018-07-13 LAB — CULTURE, BLOOD (ROUTINE X 2)
Culture: NO GROWTH
Culture: NO GROWTH
Special Requests: ADEQUATE

## 2018-07-13 LAB — COMPREHENSIVE METABOLIC PANEL
ALT: 42 U/L (ref 0–44)
AST: 55 U/L — ABNORMAL HIGH (ref 15–41)
Albumin: 2.1 g/dL — ABNORMAL LOW (ref 3.5–5.0)
Alkaline Phosphatase: 83 U/L (ref 38–126)
Anion gap: 7 (ref 5–15)
BUN: 19 mg/dL (ref 8–23)
CHLORIDE: 117 mmol/L — AB (ref 98–111)
CO2: 21 mmol/L — ABNORMAL LOW (ref 22–32)
CREATININE: 0.98 mg/dL (ref 0.44–1.00)
Calcium: 8 mg/dL — ABNORMAL LOW (ref 8.9–10.3)
GFR calc Af Amer: >60 mL/min (ref >60)
GFR calc non Af Amer: 53 mL/min — ABNORMAL LOW (ref >60)
Glucose, Bld: 140 mg/dL — ABNORMAL HIGH (ref 70–99)
Potassium: 2.5 mmol/L — CL (ref 3.5–5.1)
Sodium: 145 mmol/L (ref 135–145)
Total Bilirubin: 0.6 mg/dL (ref 0.3–1.2)
Total Protein: 4.9 g/dL — ABNORMAL LOW (ref 6.5–8.1)

## 2018-07-13 LAB — CBC
HCT: 33.4 % — ABNORMAL LOW (ref 36.0–46.0)
Hemoglobin: 11.1 g/dL — ABNORMAL LOW (ref 12.0–15.0)
MCH: 27.3 pg (ref 26.0–34.0)
MCHC: 33.2 g/dL (ref 30.0–36.0)
MCV: 82.1 fL (ref 80.0–100.0)
PLATELETS: 151 10*3/uL (ref 150–400)
RBC: 4.07 MIL/uL (ref 3.87–5.11)
RDW: 17.2 % — ABNORMAL HIGH (ref 11.5–15.5)
WBC: 16.2 10*3/uL — ABNORMAL HIGH (ref 4.0–10.5)
nRBC: 0.1 % (ref 0.0–0.2)

## 2018-07-13 LAB — MAGNESIUM: Magnesium: 1.8 mg/dL (ref 1.7–2.4)

## 2018-07-13 LAB — BRAIN NATRIURETIC PEPTIDE: B Natriuretic Peptide: 298.3 pg/mL — ABNORMAL HIGH (ref 0.0–100.0)

## 2018-07-13 LAB — PROCALCITONIN: Procalcitonin: 0.37 ng/mL

## 2018-07-13 LAB — PROTIME-INR
INR: 1.4 — ABNORMAL HIGH (ref 0.8–1.2)
Prothrombin Time: 17.4 seconds — ABNORMAL HIGH (ref 11.4–15.2)

## 2018-07-13 MED ORDER — POTASSIUM CHLORIDE CRYS ER 20 MEQ PO TBCR
40.0000 meq | EXTENDED_RELEASE_TABLET | Freq: Once | ORAL | Status: DC
  Filled 2018-07-13: qty 2

## 2018-07-13 MED ORDER — LIDOCAINE 5 % EX PTCH
2.0000 | MEDICATED_PATCH | CUTANEOUS | Status: DC
  Administered 2018-07-13 – 2018-07-20 (×8): 2 via TRANSDERMAL
  Filled 2018-07-13 (×9): qty 2

## 2018-07-13 MED ORDER — WARFARIN SODIUM 2.5 MG PO TABS
2.5000 mg | ORAL_TABLET | Freq: Once | ORAL | Status: AC
  Administered 2018-07-13: 2.5 mg via ORAL
  Filled 2018-07-13: qty 1

## 2018-07-13 MED ORDER — POTASSIUM CHLORIDE 20 MEQ/15ML (10%) PO SOLN
40.0000 meq | Freq: Once | ORAL | Status: AC
  Administered 2018-07-13: 40 meq via ORAL
  Filled 2018-07-13: qty 30

## 2018-07-13 MED ORDER — FUROSEMIDE 10 MG/ML IJ SOLN
20.0000 mg | Freq: Once | INTRAMUSCULAR | Status: AC
  Administered 2018-07-13: 20 mg via INTRAVENOUS
  Filled 2018-07-13: qty 2

## 2018-07-13 MED ORDER — LORAZEPAM 0.5 MG PO TABS
0.2500 mg | ORAL_TABLET | Freq: Two times a day (BID) | ORAL | Status: DC | PRN
  Administered 2018-07-13 – 2018-07-19 (×5): 0.25 mg via ORAL
  Filled 2018-07-13 (×5): qty 1

## 2018-07-13 MED ORDER — METHOCARBAMOL 500 MG PO TABS
250.0000 mg | ORAL_TABLET | Freq: Three times a day (TID) | ORAL | Status: DC | PRN
  Administered 2018-07-15 – 2018-07-18 (×3): 250 mg via ORAL
  Administered 2018-07-18: 500 mg via ORAL
  Administered 2018-07-20: 250 mg via ORAL
  Filled 2018-07-13 (×5): qty 1

## 2018-07-13 MED ORDER — POTASSIUM CHLORIDE CRYS ER 20 MEQ PO TBCR
40.0000 meq | EXTENDED_RELEASE_TABLET | ORAL | Status: DC
  Administered 2018-07-13: 40 meq via ORAL
  Filled 2018-07-13: qty 2

## 2018-07-13 NOTE — Clinical Social Work Note (Signed)
[facility] has received [facility] authorization. Pt's daughter is scheduled to complete paperwork at facility at 10:00. At this time, no barriers to d/c to SNF when medically stable.   [place], [place]

## 2018-07-13 NOTE — Discharge Instructions (Signed)

## 2018-07-13 NOTE — Progress Notes (Signed)
ANTICOAGULATION CONSULT NOTE - Follow Up Consult  Pharmacy Consult for Coumadin Indication:  hx DVT, on lifelong anticoagulation  Allergies  Allergen Reactions  . Contrast Media  [Iodinated Diagnostic Agents] Itching  . Keflex [Cephalexin] Rash    Severe itching  . Latex Rash  . Neosporin [Neomycin-Bacitracin Zn-Polymyx] Rash    Patient Measurements: Height: 5' 4.02" (162.6 cm) Weight: 142 lb 6.7 oz (64.6 kg) IBW/kg (Calculated) : 54.74  Vital Signs: Temp: 97.4 F (36.3 C) (03/04 0906) Temp Source: Oral (03/04 0906) BP: 140/70 (03/04 0906) Pulse Rate: 76 (03/04 0906)  Labs: Recent Labs    07/11/18 0953 07/11/18 1645 07/12/18 0343 07/13/18 0234  HGB  --  11.3* 10.8* 11.1*  HCT  --  35.9* 33.7* 33.4*  PLT  --  197 PLATELET CLUMPS NOTED ON SMEAR, UNABLE TO ESTIMATE 151  LABPROT  --   --   --  17.4*  INR  --   --   --  1.4*  CREATININE 1.36*  --  1.26* 0.98    Estimated Creatinine Clearance: 36.2 mL/min (by C-G formula based on SCr of 0.98 mg/dL).   Assessment:   85 yr old female on lifelong Coumadin PTA for hx DVT x 2.  Admitted 2/28 with GI bleed and hemorrhagic shock.  INR was 5.2. Coumadin reversed with KCentra, FFP and Vitamin K. No evidence of ongoing bleeding. Coumadin to resumed 3/3 without bridge. Plan conservative dosing for slow rise to therapeutic INR. Continues on Protonix 40 mg IV q12h.      INR 1.4 after Coumadin 2.5 mg yesterday. Hgb stable, no bleeding reported.   PTA Coumadin regimen:  5 mg MWFSat, 2.5 mg TTSun.  INR 5.2 on admit 2/28.  Daughter reports that recent outpatient INR had been just below goal and 5 mg doses were increased from three to four times a week.  Goal of Therapy:  INR 2-3 Monitor platelets by anticoagulation protocol: Yes   Plan:   Coumadin 2.5 mg again today.  Daily PT/INR.  Change Protonix to PO soon?  Arty Baumgartner, [place] Pager: [phone] or phone: [phone] 07/13/2018,10:23 AM

## 2018-07-13 NOTE — Progress Notes (Signed)
[facility] Progress Note  Patient: Becky Mcmahon OEV:[id number]   PCP: Prince Solian, MD DOB: March 16, 1931   DOA: 07/08/2018   DOS: 07/13/2018   Date of Service: the patient was seen and examined on 07/13/2018  Brief hospital course: 85yo w/ a hx of CAD s/p CABG, TAVR 2014 [facility]) breast CA s/p R mastectomy, recurrent DVTs, HTN, HLD, mild dementia, cerebral atherosclerosis, and a compression fracture who presented to the ED 2/27 w/ abdominal pain. CT abd was unremarkable. 2/28 she reported some nausea. Her daughter then found her slumped over and minimally responsive. EMS was called and she was transported to the ED where she was found to be hypotensive (SBP 65). Her Hgb at presentation was 9.3. Currently further plan is work on improving her breathing.  Subjective: feeling shortnes of breath, reports that her back pain is her main problem, rectal tube came out. No fever or chills.   Assessment and Plan: Hemorrhagic v/s septic shock  Shock resolved - hemodynamically stable at this time - no clear source of infection - now off abx - follow clinically - afebrile  Leukocytosis. WBC currently elevated.  Currently trending up Likely secondary to stress. We will monitor for now. Procalcitonin minimally elevated.  Hypokalemia. Being replaced. Monitor.  Bilateral pleural effusion. Right more than left. Receiving IV Lasix. We will recheck tomorrow.  Gastrointestinal hemorrhage Black liquid stools were noted after admit - INR 5.2 at admit, w/ recent d/c of PPI as outpt - no plans for EGD at this time - cont PPI - no evidence of ongoing bleeding at this time - follow as warfarin resumed w/o bridge  Acute blood loss anemia S/p 2U PRBC and 2U FFP thus far - Hgb appears to be stabilizing - follow w/ resumption of warfarin   Acute metabolic encephalopathy  suspect due to shock state - CT head unremarkable - Highly sensitive to psychotropic medication. Minimize the use.  S/P TAVR  2014 [facility] - 4mm core valve - do not need long term anticoag for this   Hx of DVT Was on lifelong warfarin due to 2 episodes of DVT (the first around the time of her CABG recovery, but the second sounds more unprovoked) , although like 20 years ago. will resume anticoag while still in hospital and watch for repeat bleeding  Family informed that if the patient has any episodes of bleeding would recommend to stop the anticoagulation forever.  AKI Due to shock - improved and plateaud around Cr 1.36 -  Now normal.  Monitor while the patient receiving diuresis.  Hypernatremia-resolved Due to dehydration w/ poor oral intake in setting of sedation Hold D5.  Hypothyroid Continue Synthroid  Severe back pain, arthritis, history of compression fracture L1 Discussed need for PT/OT w/ family add calcitonin nasal spray for 2-4 week course. CT scan does not show any acute compression fracture. Obvious to have chronic fracture. Will treat pain with care.,  Lidocaine patch and monitor.  Low-dose Robaxin added.  Pressure ulcer POA. Sacrum and bilateral toe. Continue dressing changes.  Pressure Injury 07/08/18 Deep Tissue Injury - Purple or maroon localized area of discolored intact skin or blood-filled blister due to damage of underlying soft tissue from pressure and/or shear. (Active)  07/08/18 1645  Location: Sacrum  Location Orientation: Medial  Staging: Deep Tissue Injury - Purple or maroon localized area of discolored intact skin or blood-filled blister due to damage of underlying soft tissue from pressure and/or shear.  Wound Description (Comments):   Present on Admission:  Yes     Pressure Injury 07/08/18 Stage II -  Partial thickness loss of dermis presenting as a shallow open ulcer with a red, pink wound bed without slough. Ulcer (Active)  07/08/18 1645  Location: Toe (Comment  which one)  Location Orientation: Right;Left  Staging: Stage II -  Partial thickness loss of dermis  presenting as a shallow open ulcer with a red, pink wound bed without slough.  Wound Description (Comments): Ulcer  Present on Admission: Yes     Diet: dysphagia diet 1 DVT Prophylaxis: subcutaneous Heparin  Advance goals of care discussion: partial code  Family Communication: family was present at bedside, at the time of interview. The pt provided permission to discuss medical plan with the family. Opportunity was given to ask question and all questions were answered satisfactorily.   Disposition:  Discharge to SNF when stable.  Consultants:  Primary admission with PCCM  [facility]   Procedures: none  Scheduled Meds: . calcitonin (salmon)  1 spray Alternating Nares Daily  . donepezil  5 mg Oral QHS  . furosemide  20 mg Intravenous Once  . levothyroxine  50 mcg Oral QAC breakfast  . lidocaine  2 patch Transdermal Q24H  . pantoprazole (PROTONIX) IV  40 mg Intravenous Q12H  . warfarin  2.5 mg Oral ONCE-1800  . Warfarin - Pharmacist Dosing Inpatient   Does not apply q1800   Continuous Infusions: PRN Meds: acetaminophen, LORazepam, methocarbamol, traMADol Antibiotics: Anti-infectives (From admission, onward)   Start     Dose/Rate Route Frequency Ordered Stop   07/10/18 1400  vancomycin (VANCOCIN) IVPB 1000 mg/200 mL premix  Status:  Discontinued     1,000 mg 200 mL/hr over 60 Minutes Intravenous Every 48 hours 07/08/18 1349 07/10/18 1516   07/09/18 1300  vancomycin (VANCOCIN) 500 mg in sodium chloride 0.9 % 100 mL IVPB     500 mg 100 mL/hr over 60 Minutes Intravenous  Once 07/09/18 1249 07/09/18 1419   07/08/18 2030  piperacillin-tazobactam (ZOSYN) IVPB 3.375 g     3.375 g 12.5 mL/hr over 240 Minutes Intravenous Every 8 hours 07/08/18 1349 07/12/18 0700   07/08/18 1400  vancomycin (VANCOCIN) 500 mg in sodium chloride 0.9 % 100 mL IVPB  Status:  Discontinued     500 mg 100 mL/hr over 60 Minutes Intravenous NOW 07/08/18 1349 07/09/18 1249   07/08/18 1230   aztreonam (AZACTAM) 2 g in sodium chloride 0.9 % 100 mL IVPB     2 g 200 mL/hr over 30 Minutes Intravenous  Once 07/08/18 1216 07/08/18 1306   07/08/18 1230  metroNIDAZOLE (FLAGYL) IVPB 500 mg  Status:  Discontinued     500 mg 100 mL/hr over 60 Minutes Intravenous Every 8 hours 07/08/18 1216 07/08/18 1340   07/08/18 1230  vancomycin (VANCOCIN) IVPB 1000 mg/200 mL premix     1,000 mg 200 mL/hr over 60 Minutes Intravenous  Once 07/08/18 1216 07/08/18 1356       Objective: Physical Exam: Vitals:   07/12/18 1558 07/12/18 2319 07/13/18 0906 07/13/18 1729  BP: (!) 145/68 (!) 133/58 140/70 (!) 145/91  Pulse: 96 88 76 88  Resp: (!) 31 20 (!) 22 16  Temp: 98.3 F (36.8 C) 98.2 F (36.8 C) (!) 97.4 F (36.3 C) 98.2 F (36.8 C)  TempSrc: Oral Oral Oral Oral  SpO2: 91% 93% 96% 96%  Weight:      Height:        Intake/Output Summary (Last 24 hours) at 07/13/2018 1738 Last data  filed at 07/13/2018 0648 Gross per 24 hour  Intake 1614.56 ml  Output 2500 ml  Net -885.44 ml   Filed Weights   07/09/18 2002 07/10/18 0331 07/12/18 0400  Weight: 65 kg 69 kg 64.6 kg   General: Alert, Awake and Oriented to Time, Place and Person. Appear in marked distress, affect appropriate Eyes: PERRL, Conjunctiva normal ENT: Oral Mucosa clear moist. Neck: difficult to assess  JVD, no Abnormal Mass Or lumps Cardiovascular: S1 and S2 Present, aortic systolic  Murmur, Peripheral Pulses Present Respiratory: increased respiratory effort, Bilateral Air entry equal and Decreased, no use of accessory muscle, bilateral  Crackles, Occasional  wheezes Abdomen: Bowel Sound present, Soft and no tenderness, no hernia Skin: no redness, no Rash, no induration Extremities: no Pedal edema, no calf tenderness Neurologic: Grossly no focal neuro deficit. Bilaterally Equal motor strength  Data Reviewed: CBC: Recent Labs  Lab 07/08/18 1218  07/11/18 0250 07/11/18 1645 07/12/18 0343 07/13/18 0234 07/13/18 1701  WBC 13.2*    < > 21.3* 20.7* 16.2* 16.2* 19.0*  NEUTROABS 9.7*  --   --   --   --   --  16.7*  HGB 9.3*   < > 10.8* 11.3* 10.8* 11.1* 11.2*  HCT 32.9*   < > 34.2* 35.9* 33.7* 33.4* 35.2*  MCV 85.2   < > 84.0 83.7 85.3 82.1 82.6  PLT 261   < > 173 197 PLATELET CLUMPS NOTED ON SMEAR, UNABLE TO ESTIMATE 151 156   < > = values in this interval not displayed.   Basic Metabolic Panel: Recent Labs  Lab 07/09/18 0630 07/10/18 0614 07/11/18 0250 07/11/18 0953 07/12/18 0343 07/13/18 0234 07/13/18 1129  NA 148* 154* 156* 154* 147* 145 145  K 3.9 3.5 3.0* 3.2* 3.2* 2.5* 3.5  CL 125* 129* >130* >130* 123* 117* 117*  CO2 13* 15* 19* 16* 16* 21* 21*  GLUCOSE 114* 127* 89 180* 137* 140* 135*  BUN 57* 44* 39* 37* 28* 19 16  CREATININE 1.20* 1.36* 1.37* 1.36* 1.26* 0.98 0.87  CALCIUM 8.3* 8.5* 8.4* 8.4* 8.2* 8.0* 8.1*  MG 1.9 2.1  --   --   --  1.8  --   PHOS 3.0  --   --   --   --   --   --     Liver Function Tests: Recent Labs  Lab 07/07/18 1450 07/08/18 1218 07/12/18 0343 07/13/18 0234  AST 36 61* 26 55*  ALT 7 32 26 42  ALKPHOS 86 63 72 83  BILITOT 1.5* 0.5 0.7 0.6  PROT 7.4 5.4* 4.6* 4.9*  ALBUMIN 3.5 2.2* 2.0* 2.1*   Recent Labs  Lab 07/07/18 1450  LIPASE 50   No results for input(s): AMMONIA in the last 168 hours. Coagulation Profile: Recent Labs  Lab 07/09/18 0619 07/09/18 1138 07/09/18 2355 07/10/18 0614 07/13/18 0234  INR 1.2 1.1 1.1 1.1 1.4*   Cardiac Enzymes: Recent Labs  Lab 07/08/18 1218  TROPONINI 0.03*   BNP (last 3 results) No results for input(s): PROBNP in the last 8760 hours. CBG: Recent Labs  Lab 07/11/18 1706 07/11/18 2107 07/11/18 2252 07/12/18 0737 07/12/18 1219  GLUCAP 134* 150* 138* 115* 148*   Studies: Dg Chest Port 1 View  Result Date: 07/13/2018 CLINICAL DATA:  85 year old female with sudden onset of shortness of breath today. EXAM: PORTABLE CHEST 1 VIEW COMPARISON:  Chest x-ray 07/08/2018. FINDINGS: Lung volumes are low. Extensive  bibasilar opacities (right greater than left) appears similar to the  prior study and may reflect areas of atelectasis and/or consolidation. Moderate right and small left pleural effusions. Large hiatal hernia. No definite pulmonary edema. Heart size count be evaluated secondary to patient positioning which distorts mediastinal contours. Aortic atherosclerosis. Postprocedural changes of prior TAVR procedure with CoreValve in position. Status post median sternotomy for CABG including LIMA. IMPRESSION: 1. Overall, the appearance the chest appears very similar to the prior examination, with moderate right and small left pleural effusions as well as bibasilar opacities which may reflect areas of atelectasis and/or consolidation. 2. Aortic atherosclerosis. Electronically Signed   By: Vinnie Langton M.D.   On: 07/13/2018 13:13     Time spent: 35 minutes  Author: Berle Mull, MD [facility] 07/13/2018 5:38 PM  To reach On-call, see care teams to locate the attending and reach out to them via [email or web address]. If 7PM-7AM, please contact night-coverage If you still have difficulty reaching the attending provider, please page the [facility] (Director on Call) for [facility] on amion for assistance.

## 2018-07-13 NOTE — Care Management Important Message (Signed)
Important Message  Patient Details  Name: Becky Mcmahon MRN: [id number] Date of Birth: Aug 24, 1930   Medicare Important Message Given:  Yes    [name] 07/13/2018, 3:45 PM

## 2018-07-14 ENCOUNTER — Inpatient Hospital Stay (HOSPITAL_COMMUNITY): Payer: Medicare Other

## 2018-07-14 DIAGNOSIS — J90 Pleural effusion, not elsewhere classified: Secondary | ICD-10-CM

## 2018-07-14 LAB — COMPREHENSIVE METABOLIC PANEL
ALT: 46 U/L — ABNORMAL HIGH (ref 0–44)
AST: 46 U/L — ABNORMAL HIGH (ref 15–41)
Albumin: 1.9 g/dL — ABNORMAL LOW (ref 3.5–5.0)
Alkaline Phosphatase: 102 U/L (ref 38–126)
Anion gap: 7 (ref 5–15)
BUN: 15 mg/dL (ref 8–23)
CO2: 25 mmol/L (ref 22–32)
Calcium: 7.8 mg/dL — ABNORMAL LOW (ref 8.9–10.3)
Chloride: 114 mmol/L — ABNORMAL HIGH (ref 98–111)
Creatinine, Ser: 0.88 mg/dL (ref 0.44–1.00)
GFR calc Af Amer: >60 mL/min (ref >60)
GFR calc non Af Amer: 60 mL/min — ABNORMAL LOW (ref >60)
Glucose, Bld: 103 mg/dL — ABNORMAL HIGH (ref 70–99)
POTASSIUM: 3.3 mmol/L — AB (ref 3.5–5.1)
Sodium: 146 mmol/L — ABNORMAL HIGH (ref 135–145)
Total Bilirubin: 0.3 mg/dL (ref 0.3–1.2)
Total Protein: 4.9 g/dL — ABNORMAL LOW (ref 6.5–8.1)

## 2018-07-14 LAB — CBC WITH DIFFERENTIAL/PLATELET
Abs Immature Granulocytes: 0.18 10*3/uL — ABNORMAL HIGH (ref 0.00–0.07)
BASOS PCT: 0 %
Basophils Absolute: 0.1 10*3/uL (ref 0.0–0.1)
Eosinophils Absolute: 0.4 10*3/uL (ref 0.0–0.5)
Eosinophils Relative: 2 %
HCT: 34.1 % — ABNORMAL LOW (ref 36.0–46.0)
Hemoglobin: 11 g/dL — ABNORMAL LOW (ref 12.0–15.0)
Immature Granulocytes: 1 %
Lymphocytes Relative: 8 %
Lymphs Abs: 1.3 10*3/uL (ref 0.7–4.0)
MCH: 27 pg (ref 26.0–34.0)
MCHC: 32.3 g/dL (ref 30.0–36.0)
MCV: 83.6 fL (ref 80.0–100.0)
Monocytes Absolute: 1.2 10*3/uL — ABNORMAL HIGH (ref 0.1–1.0)
Monocytes Relative: 7 %
Neutro Abs: 13.7 10*3/uL — ABNORMAL HIGH (ref 1.7–7.7)
Neutrophils Relative %: 82 %
Platelets: 186 10*3/uL (ref 150–400)
RBC: 4.08 MIL/uL (ref 3.87–5.11)
RDW: 18 % — ABNORMAL HIGH (ref 11.5–15.5)
WBC: 16.9 10*3/uL — ABNORMAL HIGH (ref 4.0–10.5)
nRBC: 0 % (ref 0.0–0.2)

## 2018-07-14 LAB — MAGNESIUM: MAGNESIUM: 1.8 mg/dL (ref 1.7–2.4)

## 2018-07-14 LAB — PROTIME-INR
INR: 1.6 — ABNORMAL HIGH (ref 0.8–1.2)
Prothrombin Time: 18.9 seconds — ABNORMAL HIGH (ref 11.4–15.2)

## 2018-07-14 MED ORDER — ATORVASTATIN CALCIUM 10 MG PO TABS
20.0000 mg | ORAL_TABLET | Freq: Every evening | ORAL | Status: DC
  Administered 2018-07-14 – 2018-07-19 (×6): 20 mg via ORAL
  Filled 2018-07-14 (×7): qty 2

## 2018-07-14 MED ORDER — WARFARIN SODIUM 2.5 MG PO TABS
2.5000 mg | ORAL_TABLET | Freq: Once | ORAL | Status: AC
  Administered 2018-07-14: 2.5 mg via ORAL
  Filled 2018-07-14: qty 1

## 2018-07-14 MED ORDER — POTASSIUM CHLORIDE CRYS ER 20 MEQ PO TBCR
40.0000 meq | EXTENDED_RELEASE_TABLET | Freq: Once | ORAL | Status: AC
  Administered 2018-07-14: 40 meq via ORAL
  Filled 2018-07-14: qty 2

## 2018-07-14 MED ORDER — PANTOPRAZOLE SODIUM 40 MG PO TBEC
40.0000 mg | DELAYED_RELEASE_TABLET | Freq: Two times a day (BID) | ORAL | Status: DC
  Administered 2018-07-14 – 2018-07-20 (×12): 40 mg via ORAL
  Filled 2018-07-14 (×13): qty 1

## 2018-07-14 NOTE — Progress Notes (Signed)
[facility] PROGRESS NOTE  Patient: Becky Mcmahon SJG:[id number]   PCP: Prince Solian, MD DOB: Sep 20, 1930   DOA: 07/08/2018   DOS: 07/14/2018    Subjective: He was discussed with the family regarding the findings of the CT scan of the chest. Patient mentions that her pain is still present.  Denies any nausea or vomiting.  Breathing actually much more comfortable.  Objective:  Vitals:   07/14/18 0813 07/14/18 1707  BP: 136/61 130/76  Pulse: 87 88  Resp: 17 20  Temp: 97.6 F (36.4 C) 98.1 F (36.7 C)  SpO2: 98% 97%    Bilateral sensation present.  Able to flex knee and move ankle as well as toe. No significant difference in motor strength bilaterally in the lower extremity.  Assessment and plan: 1.  Bilateral pleural effusion right more than left. With persistent leukocytosis and respiratory distress with presentation with shock at the time of admission I am still worried about patient having an active infection. Would recommend ultrasound-guided thoracentesis which the family is currently agreeable. We will place the orders for 1. Preferably should be done in laying position as the patient has strict bedrest.  2.  Left upper lobe nodule. Patient had developed nodule based on the CT scan done 04/07/2016, 8 x 5 mm in size. Currently he is in size to 13 x 9 mm. Appearance is concerning for a possible lung mass. Discussed with Dr. Elsworth Soho from [facility] recommend outpatient follow-up. Please investigate him at the time of the discharge.  3.  New unstable T10 transverse shear fracture with 1.4 cm of anterior distraction with acute lordosis. Patient does not have any focal neurological deficit in the lower extremity. Discussed with neurosurgery on-call who will follow-up with the patient tomorrow morning. Recommend currently complete bedrest. Patient will benefit from TLSO brace.  4.  History of aortic stenosis with TAVR. Concern for acute on chronic diastolic CHF. We will  get echocardiogram for further understanding of valve hemodynamics as well as cardiac function.  Author: Berle Mull, MD [facility] 07/14/2018 7:01 PM   If 7PM-7AM, please contact night-coverage at [email or web address]

## 2018-07-14 NOTE — Progress Notes (Signed)
NEUROSURGERY PROGRESS NOTE  85 year old who presented to the hospital with septic shock and GI bleed complaining of  Severe back pain with movement. CT chest shows L1 vertebral compression fracture, T7 and T9 compression deformities and an acute left transverse shear fracture through the inferior aspect of T10with distraction of the fragment anteriorly causing significant amount of lordosis.This fracture is unstable and therefore patient needs to be on bedrest and remain flat. We will order a TLSO brace. Will need to speak with family in the morning about goals of care as I am unsure whether surgical stabilization would cause more harm than good at this point with her age, bone quality, and comorbidities.   Temp:  [97.6 F (36.4 C)-98.1 F (36.7 C)] 98.1 F (36.7 C) (03/05 1707) Pulse Rate:  [75-105] 88 (03/05 1707) Resp:  [17-20] 20 (03/05 1707) BP: (115-158)/(58-103) 130/76 (03/05 1707) SpO2:  [95 %-100 %] 97 % (03/05 1707)  Becky Chiquito, NP 07/14/2018 6:58 PM

## 2018-07-14 NOTE — Progress Notes (Signed)
Physical Therapy Treatment Patient Details Name: Becky Mcmahon MRN: [id number] DOB: 1931-02-04 Today's Date: 07/14/2018    History of Present Illness 85 y.o. female admitted on 07/08/18 from home where she was found to be minimally responsive.  She was hypotensive and anemic in the ED.  Pt found to have hemorrhagic vs septic shock, GI hemorrhage, ABLA s/p 2 units PRBC, and acute metabolic encepholopathy.  Pt with other significant PMH of fall with thoracic compression fx, DM2, CABG, HTN, DVT, CAD, R Breast CA s/p mastectomy, R hip arthroplasty, and aortic valve replacement.      PT Comments    Patient limited greatly this visit by fear and anxiety of mobility, also moaning it hurts with any slight motion of any limb. Too fearful to stand today. Family not present during visit but later when communicating said this is typical of patient and you can push her more through this. Will attempt to progress mobility next session.    Follow Up Recommendations   SNF     Equipment Recommendations       Recommendations for Other Services       Precautions / Restrictions Precautions Precautions: Fall Precaution Comments: at risk for skin breakdown Restrictions Weight Bearing Restrictions: No    Mobility  Bed Mobility Overal bed mobility: Needs Assistance Bed Mobility: Supine to Sit;Sit to Supine;Rolling Rolling: Max assist;Total assist;+2 for physical assistance   Supine to sit: Max assist;+2 for physical assistance;Total assist Sit to supine: HOB elevated;Max assist   General bed mobility comments: sat EOB for 10 minutes, once up was comfortable. transitional movements and trnasfers scare patient and she yells at in pain. compression fx history family accredits this too.   Transfers                 General transfer comment: too fearful and yelling in pain to attempt  Ambulation/Gait                 Stairs             Wheelchair Mobility    Modified  Rankin (Stroke Patients Only)       Balance Overall balance assessment: Needs assistance Sitting-balance support: Feet supported;Bilateral upper extremity supported Sitting balance-Leahy Scale: Poor Sitting balance - Comments: R lateral lean in bed                                    Cognition Arousal/Alertness: Awake/alert Behavior During Therapy: WFL for tasks assessed/performed Overall Cognitive Status: Impaired/Different from baseline                                 General Comments: General conversation is normal, fear and anxiety of falling. Cognitioin most likely affected by medicaitoin but not at baseline. will further assess      Exercises      General Comments        Pertinent Vitals/Pain Pain Assessment: Faces Faces Pain Scale: Hurts a little bit Pain Location: back Pain Descriptors / Indicators: Aching;Discomfort Pain Intervention(s): Limited activity within patient's tolerance    Home Living                      Prior Function            PT Goals (current goals can now be found in the care plan section)  Acute Rehab PT Goals Patient Stated Goal: to decrease back pain and get strong enough to walk so she can go home again.  PT Goal Formulation: With patient/family Time For Goal Achievement: 07/25/18 Potential to Achieve Goals: Fair Progress towards PT goals: Progressing toward goals    Frequency           PT Plan Current plan remains appropriate    Co-evaluation              AM-PAC PT "6 Clicks" Mobility   Outcome Measure  Help needed turning from your back to your side while in a flat bed without using bedrails?: Total Help needed moving from lying on your back to sitting on the side of a flat bed without using bedrails?: A Lot Help needed moving to and from a bed to a chair (including a wheelchair)?: Total Help needed standing up from a chair using your arms (e.g., wheelchair or bedside chair)?:  Total Help needed to walk in hospital room?: Total Help needed climbing 3-5 steps with a railing? : Total 6 Click Score: 7    End of Session Equipment Utilized During Treatment: Oxygen Activity Tolerance: Patient limited by pain Patient left: in bed;with call bell/phone within reach;with family/visitor present Nurse Communication: Mobility status PT Visit Diagnosis: Muscle weakness (generalized) (M62.81);Difficulty in walking, not elsewhere classified (R26.2);Pain     Time: 1600-1620 PT Time Calculation (min) (ACUTE ONLY): 20 min  Charges:  $Therapeutic Activity: 8-22 mins                    Reinaldo Berber, PT, DPT Acute Rehabilitation Services Pager: [phone] Office: [phone]     Reinaldo Berber 07/14/2018, 4:52 PM

## 2018-07-14 NOTE — Progress Notes (Addendum)
[facility] Progress Note  Patient: Becky Mcmahon:[id number]   PCP: Prince Solian, MD DOB: 1930/09/14   DOA: 07/08/2018   DOS: 07/14/2018   Date of Service: the patient was seen and examined on 07/14/2018  Brief hospital course: 85yo w/ a hx of CAD s/p CABG, TAVR 2014 [facility]) breast CA s/p R mastectomy, recurrent DVTs, HTN, HLD, mild dementia, cerebral atherosclerosis, and a compression fracture who presented to the ED 2/27 w/ abdominal pain. CT abd was unremarkable. 2/28 she reported some nausea. Her daughter then found her slumped over and minimally responsive. EMS was called and she was transported to the ED where she was found to be hypotensive (SBP 65). Her Hgb at presentation was 9.3.  Patient was admitted in the ICU.  IV hydration, IV pressors, IV antibiotics and transfusion.  GI was consulted who felt that the patient was too unstable to go for any procedure.  Patient's H&H remained stable and blood pressure improved and patient was able to come off of the pressors and therefore was transferred to stepdown unit. Her antibiotics were stopped after 4 days on 07/12/2018 Currently further plan is work on improving her breathing.  Subjective: Per family the patient appears to be doing better.  Patient continues to breathe heavily with respiratory rate in 30s overnight.  Continues to report back pain.  No chest pain. No nausea vomiting.  Somewhat sleepy was compared to yesterday.  Assessment and Plan: Hemorrhagic v/s septic shock  Shock resolved - hemodynamically stable at this time - no clear source of infection - now off abx - follow clinically - afebrile  Leukocytosis. WBC currently elevated.  Currently trending up Likely secondary to stress. Procalcitonin minimally elevated. Chest x-ray shows pleural effusion. No fever. We will get CT chest to identify any other etiology of persistent leukocytosis.  Hypokalemia. Being replaced. Monitor.  Bilateral pleural  effusion. Right more than left. Received 1 dose of 20 mg IV Lasix. Repeat chest x-ray shows persistent pleural effusion.  Will get CT scan of this   Gastrointestinal hemorrhage Black liquid stools were noted after admit - INR 5.2 at admit, w/ recent d/c of PPI as outpt - no plans for EGD at this time - cont PPI - no evidence of ongoing bleeding at this time - follow as warfarin resumed w/o bridge  Acute blood loss anemia S/p 2U PRBC and 2U FFP thus far - Hgb appears to be stabilizing - follow w/ resumption of warfarin   Acute metabolic encephalopathy  suspect due to shock state - CT head unremarkable - Highly sensitive to psychotropic medication. Minimize the use.  S/P TAVR 2014 [facility] - 42mm core valve - do not need long term anticoag for this   Hx of DVT Was on lifelong warfarin due to 2 episodes of DVT (the first around the time of her CABG recovery, but the second sounds more unprovoked) , although like 20 years ago. will resume anticoag while still in hospital and watch for repeat bleeding  Family informed that if the patient has any episodes of bleeding would recommend to stop the anticoagulation forever.  AKI Due to shock - improved and plateaud around Cr 1.36 -  Now normal.  Monitor while the patient receiving diuresis.  Hypernatremia-resolved Due to dehydration w/ poor oral intake in setting of sedation Hold D5.  Monitor for now.  Hypothyroid Continue Synthroid  Severe back pain, arthritis, history of compression fracture L1 Discussed need for PT/OT w/ family add calcitonin nasal spray for  2-4 week course. CT scan does not show any acute compression fracture. Obvious to have chronic fracture. Will treat pain with care.,  Lidocaine patch and monitor.  Low-dose Robaxin added.  Pressure ulcer POA. Sacrum and bilateral toe. Continue dressing changes.  Pressure Injury 07/08/18 Deep Tissue Injury - Purple or maroon localized area of discolored intact skin or  blood-filled blister due to damage of underlying soft tissue from pressure and/or shear. (Active)  07/08/18 1645  Location: Sacrum  Location Orientation: Medial  Staging: Deep Tissue Injury - Purple or maroon localized area of discolored intact skin or blood-filled blister due to damage of underlying soft tissue from pressure and/or shear.  Wound Description (Comments):   Present on Admission: Yes     Pressure Injury 07/08/18 Stage II -  Partial thickness loss of dermis presenting as a shallow open ulcer with a red, pink wound bed without slough. Ulcer (Active)  07/08/18 1645  Location: Toe (Comment  which one)  Location Orientation: Right;Left  Staging: Stage II -  Partial thickness loss of dermis presenting as a shallow open ulcer with a red, pink wound bed without slough.  Wound Description (Comments): Ulcer  Present on Admission: Yes     Addendum: 1.  Bilateral pleural effusion right more than left. With persistent leukocytosis and respiratory distress with presentation with shock at the time of admission I am still worried about patient having an active infection. Would recommend ultrasound-guided thoracentesis which the family is currently agreeable. We will place the orders for 1. Preferably should be done in laying position as the patient has strict bedrest.  2.  Left upper lobe nodule. Patient had developed nodule based on the CT scan done 04/07/2016, 8 x 5 mm in size. Currently he is in size to 13 x 9 mm. Appearance is concerning for a possible lung mass. Discussed with Dr. Elsworth Soho from [facility] recommend outpatient follow-up. Please investigate him at the time of the discharge.  3.  New unstable T10 transverse shear fracture with 1.4 cm of anterior distraction with acute lordosis. Patient does not have any focal neurological deficit in the lower extremity. Discussed with neurosurgery on-call who will follow-up with the patient tomorrow morning. Recommend currently complete  bedrest. Patient will benefit from TLSO brace.  4.  History of aortic stenosis with TAVR. Concern for acute on chronic diastolic CHF. We will get echocardiogram for further understanding of valve hemodynamics as well as cardiac function.  Berle Mull 7:06 PM 07/14/2018    Diet: dysphagia diet 1 DVT Prophylaxis: subcutaneous Heparin  Advance goals of care discussion: partial code  Family Communication: family was present at bedside, at the time of interview. The pt provided permission to discuss medical plan with the family. Opportunity was given to ask question and all questions were answered satisfactorily.   Disposition:  Discharge to SNF when stable.  Consultants:  Primary admission with PCCM  [facility]   Procedures: none  Scheduled Meds: . atorvastatin  20 mg Oral QPM  . calcitonin (salmon)  1 spray Alternating Nares Daily  . donepezil  5 mg Oral QHS  . levothyroxine  50 mcg Oral QAC breakfast  . lidocaine  2 patch Transdermal Q24H  . pantoprazole  40 mg Oral BID AC  . warfarin  2.5 mg Oral ONCE-1800  . Warfarin - Pharmacist Dosing Inpatient   Does not apply q1800   Continuous Infusions: PRN Meds: acetaminophen, LORazepam, methocarbamol, traMADol Antibiotics: Anti-infectives (From admission, onward)   Start     Dose/Rate  Route Frequency Ordered Stop   07/10/18 1400  vancomycin (VANCOCIN) IVPB 1000 mg/200 mL premix  Status:  Discontinued     1,000 mg 200 mL/hr over 60 Minutes Intravenous Every 48 hours 07/08/18 1349 07/10/18 1516   07/09/18 1300  vancomycin (VANCOCIN) 500 mg in sodium chloride 0.9 % 100 mL IVPB     500 mg 100 mL/hr over 60 Minutes Intravenous  Once 07/09/18 1249 07/09/18 1419   07/08/18 2030  piperacillin-tazobactam (ZOSYN) IVPB 3.375 g     3.375 g 12.5 mL/hr over 240 Minutes Intravenous Every 8 hours 07/08/18 1349 07/12/18 0700   07/08/18 1400  vancomycin (VANCOCIN) 500 mg in sodium chloride 0.9 % 100 mL IVPB  Status:  Discontinued      500 mg 100 mL/hr over 60 Minutes Intravenous NOW 07/08/18 1349 07/09/18 1249   07/08/18 1230  aztreonam (AZACTAM) 2 g in sodium chloride 0.9 % 100 mL IVPB     2 g 200 mL/hr over 30 Minutes Intravenous  Once 07/08/18 1216 07/08/18 1306   07/08/18 1230  metroNIDAZOLE (FLAGYL) IVPB 500 mg  Status:  Discontinued     500 mg 100 mL/hr over 60 Minutes Intravenous Every 8 hours 07/08/18 1216 07/08/18 1340   07/08/18 1230  vancomycin (VANCOCIN) IVPB 1000 mg/200 mL premix     1,000 mg 200 mL/hr over 60 Minutes Intravenous  Once 07/08/18 1216 07/08/18 1356       Objective: Physical Exam: Vitals:   07/13/18 1729 07/13/18 2322 07/14/18 0439 07/14/18 0813  BP: (!) 145/91 (!) 115/58 (!) 158/103 136/61  Pulse: 88 75 (!) 105 87  Resp: 16 20 19 17   Temp: 98.2 F (36.8 C) 97.8 F (36.6 C) 97.8 F (36.6 C) 97.6 F (36.4 C)  TempSrc: Oral Oral Oral Oral  SpO2: 96% 100% 95% 98%  Weight:      Height:        Intake/Output Summary (Last 24 hours) at 07/14/2018 1315 Last data filed at 07/14/2018 2831 Gross per 24 hour  Intake -  Output 750 ml  Net -750 ml   Filed Weights   07/09/18 2002 07/10/18 0331 07/12/18 0400  Weight: 65 kg 69 kg 64.6 kg   General: Alert, Awake and Oriented to Time, Place and Person. Appear in marked distress, affect appropriate Eyes: PERRL, Conjunctiva normal ENT: Oral Mucosa clear moist. Neck: difficult to assess  JVD, no Abnormal Mass Or lumps Cardiovascular: S1 and S2 Present, aortic systolic  Murmur, Peripheral Pulses Present Respiratory: increased respiratory effort, Bilateral Air entry equal and Decreased, no use of accessory muscle, bilateral  Crackles, Occasional  wheezes Abdomen: Bowel Sound present, Soft and no tenderness, no hernia Skin: no redness, no Rash, no induration Extremities: no Pedal edema, no calf tenderness Neurologic: Grossly no focal neuro deficit. Bilaterally Equal motor strength  Data Reviewed: CBC: Recent Labs  Lab 07/08/18 1218   07/11/18 1645 07/12/18 0343 07/13/18 0234 07/13/18 1701 07/14/18 0600  WBC 13.2*   < > 20.7* 16.2* 16.2* 19.0* 16.9*  NEUTROABS 9.7*  --   --   --   --  16.7* 13.7*  HGB 9.3*   < > 11.3* 10.8* 11.1* 11.2* 11.0*  HCT 32.9*   < > 35.9* 33.7* 33.4* 35.2* 34.1*  MCV 85.2   < > 83.7 85.3 82.1 82.6 83.6  PLT 261   < > 197 PLATELET CLUMPS NOTED ON SMEAR, UNABLE TO ESTIMATE 151 156 186   < > = values in this interval not displayed.  Basic Metabolic Panel: Recent Labs  Lab 07/09/18 0630 07/10/18 0614  07/11/18 0953 07/12/18 0343 07/13/18 0234 07/13/18 1129 07/14/18 0230  NA 148* 154*   < > 154* 147* 145 145 146*  K 3.9 3.5   < > 3.2* 3.2* 2.5* 3.5 3.3*  CL 125* 129*   < > >130* 123* 117* 117* 114*  CO2 13* 15*   < > 16* 16* 21* 21* 25  GLUCOSE 114* 127*   < > 180* 137* 140* 135* 103*  BUN 57* 44*   < > 37* 28* 19 16 15   CREATININE 1.20* 1.36*   < > 1.36* 1.26* 0.98 0.87 0.88  CALCIUM 8.3* 8.5*   < > 8.4* 8.2* 8.0* 8.1* 7.8*  MG 1.9 2.1  --   --   --  1.8  --  1.8  PHOS 3.0  --   --   --   --   --   --   --    < > = values in this interval not displayed.    Liver Function Tests: Recent Labs  Lab 07/07/18 1450 07/08/18 1218 07/12/18 0343 07/13/18 0234 07/14/18 0230  AST 36 61* 26 55* 46*  ALT 7 32 26 42 46*  ALKPHOS 86 63 72 83 102  BILITOT 1.5* 0.5 0.7 0.6 0.3  PROT 7.4 5.4* 4.6* 4.9* 4.9*  ALBUMIN 3.5 2.2* 2.0* 2.1* 1.9*   Recent Labs  Lab 07/07/18 1450  LIPASE 50   No results for input(s): AMMONIA in the last 168 hours. Coagulation Profile: Recent Labs  Lab 07/09/18 1138 07/09/18 2355 07/10/18 0614 07/13/18 0234 07/14/18 0230  INR 1.1 1.1 1.1 1.4* 1.6*   Cardiac Enzymes: Recent Labs  Lab 07/08/18 1218  TROPONINI 0.03*   BNP (last 3 results) No results for input(s): PROBNP in the last 8760 hours. CBG: Recent Labs  Lab 07/11/18 1706 07/11/18 2107 07/11/18 2252 07/12/18 0737 07/12/18 1219  GLUCAP 134* 150* 138* 115* 148*   Studies: Dg Chest  Port 1 View  Result Date: 07/14/2018 CLINICAL DATA:  Chest tube present.  Right pleural effusion. EXAM: PORTABLE CHEST 1 VIEW COMPARISON:  07/13/2018 FINDINGS: Stable cardiomegaly. Stable bilateral pleural effusions and volume loss in the lungs. Large hiatal hernia is noted. No pneumothorax. Normal vascularity. Postoperative changes. IMPRESSION: Stable bilateral pleural effusions. Electronically Signed   By: Marybelle Killings M.D.   On: 07/14/2018 10:07     Time spent: 35 minutes  Author: Berle Mull, MD [facility] 07/14/2018 1:15 PM  To reach On-call, see care teams to locate the attending and reach out to them via [email or web address]. If 7PM-7AM, please contact night-coverage If you still have difficulty reaching the attending provider, please page the [facility] (Director on Call) for [facility] on amion for assistance.

## 2018-07-14 NOTE — Progress Notes (Signed)
ANTICOAGULATION CONSULT NOTE - Follow Up Consult  Pharmacy Consult for Coumadin Indication:  hx DVT, on lifelong anticoagulation  Allergies  Allergen Reactions  . Contrast Media  [Iodinated Diagnostic Agents] Itching  . Keflex [Cephalexin] Rash    Severe itching  . Latex Rash  . Neosporin [Neomycin-Bacitracin Zn-Polymyx] Rash    Patient Measurements: Height: 5' 4.02" (162.6 cm) Weight: 142 lb 6.7 oz (64.6 kg) IBW/kg (Calculated) : 54.74  Vital Signs: Temp: 97.6 F (36.4 C) (03/05 0813) Temp Source: Oral (03/05 0813) BP: 136/61 (03/05 0813) Pulse Rate: 87 (03/05 0813)  Labs: Recent Labs    07/13/18 0234 07/13/18 1129 07/13/18 1701 07/14/18 0230 07/14/18 0600  HGB 11.1*  --  11.2*  --  11.0*  HCT 33.4*  --  35.2*  --  34.1*  PLT 151  --  156  --  186  LABPROT 17.4*  --   --  18.9*  --   INR 1.4*  --   --  1.6*  --   CREATININE 0.98 0.87  --  0.88  --     Estimated Creatinine Clearance: 40.4 mL/min (by C-G formula based on SCr of 0.88 mg/dL).   Assessment:   85 yr old female on lifelong Coumadin PTA for hx DVT x 2.  Admitted 2/28 with GI bleed and hemorrhagic shock.  INR was 5.2. Coumadin reversed with KCentra, FFP and Vitamin K. No evidence of ongoing bleeding. Coumadin to resumed 3/3 without bridge. Plan conservative dosing for slow rise to therapeutic INR. Continues on Protonix 40 mg IV q12h.      INR 1.6 after Coumadin 2.5 mg daily x 2. Hgb stable.  Minor nosebleed this am, possibly due to nasal cannula/oxygen.  RN humidified O2.   PTA Coumadin regimen:  5 mg MWFSat, 2.5 mg TTSun.  INR 5.2 on admit 2/28.  Daughter reports that recent outpatient INR had been just below goal and 5 mg doses were increased from three to four times a week.  Goal of Therapy:  INR 2-3 Monitor platelets by anticoagulation protocol: Yes   Plan:   Coumadin 2.5 mg again today.  Daily PT/INR.  Change Protonix to PO soon?  Arty Baumgartner, [place] Pager: [phone] or phone:  [phone] 07/14/2018,10:29 AM

## 2018-07-14 NOTE — Progress Notes (Signed)
  Speech Language Pathology Treatment: Dysphagia  Patient Details Name: Becky Mcmahon MRN: [id number] DOB: 09/22/1930 Today's Date: 07/14/2018 Time: 1100-1110 SLP Time Calculation (min) (ACUTE ONLY): 10 min  Assessment / Plan / Recommendation Clinical Impression  Pt remains weak, with poor motivation to feed herself, but is able to do so when properly positioned and encouraged.  Today she consumed graham crackers and thin liquids with no overt s/s of aspiration and adequate mastication.  She is not a fast eater, but she is quite functional and demonstrated adequate coordination of respiratory/swallowing sequence. No cueing was needed for swallowing.  Daughter reports improved appetite the last 24 hours.  Recommend advancing diet to soft mechanical; continue thin liquids.  No further SLP intervention is needed.  Our service will sign off.    HPI HPI: 85 year old female with CAD status post CABG, breast cancer status post mastectomy, hypertension, hyperlipidemia, diabetes, mild dementia, hiatal hernia, DVT, and recent compression fracture of spine. Admitted with shock and altered mental status unclear etiology. Initial concern for possible GI hemorrhage as etiology of shock but per GI no signs of active GI bleeding. Recommended clear liquid diet, advance as tolerated. Pt has remained encephalopathic, CT head 07/08/18 showed no acute abnormality, CXR showed no active disease. Per RN pt was coughing with ice chips.      SLP Plan  All goals met       Recommendations  Diet recommendations: Dysphagia 3 (mechanical soft);Thin liquid Liquids provided via: Cup;Straw Medication Administration: Whole meds with puree Supervision: Staff to assist with self feeding Postural Changes and/or Swallow Maneuvers: Seated upright 90 degrees                Oral Care Recommendations: Oral care BID SLP Visit Diagnosis: Dysphagia, unspecified (R13.10) Plan: All goals met       GO                 Becky Mcmahon 07/14/2018, 11:20 AM  Estill Bamberg L. Tivis Ringer, [place] Office number [phone] Pager [phone]

## 2018-07-15 ENCOUNTER — Inpatient Hospital Stay (HOSPITAL_COMMUNITY): Payer: Medicare Other

## 2018-07-15 ENCOUNTER — Encounter (HOSPITAL_COMMUNITY): Payer: Self-pay | Admitting: Neurological Surgery

## 2018-07-15 DIAGNOSIS — I361 Nonrheumatic tricuspid (valve) insufficiency: Secondary | ICD-10-CM

## 2018-07-15 DIAGNOSIS — S22079A Unspecified fracture of T9-T10 vertebra, initial encounter for closed fracture: Secondary | ICD-10-CM

## 2018-07-15 DIAGNOSIS — R791 Abnormal coagulation profile: Secondary | ICD-10-CM | POA: Diagnosis present

## 2018-07-15 DIAGNOSIS — J90 Pleural effusion, not elsewhere classified: Secondary | ICD-10-CM

## 2018-07-15 DIAGNOSIS — R6521 Severe sepsis with septic shock: Secondary | ICD-10-CM

## 2018-07-15 DIAGNOSIS — A419 Sepsis, unspecified organism: Secondary | ICD-10-CM | POA: Diagnosis present

## 2018-07-15 DIAGNOSIS — S22009A Unspecified fracture of unspecified thoracic vertebra, initial encounter for closed fracture: Secondary | ICD-10-CM | POA: Clinically undetermined

## 2018-07-15 DIAGNOSIS — Z515 Encounter for palliative care: Secondary | ICD-10-CM

## 2018-07-15 DIAGNOSIS — E872 Acidosis, unspecified: Secondary | ICD-10-CM | POA: Diagnosis present

## 2018-07-15 DIAGNOSIS — R0602 Shortness of breath: Secondary | ICD-10-CM

## 2018-07-15 DIAGNOSIS — Z7189 Other specified counseling: Secondary | ICD-10-CM

## 2018-07-15 DIAGNOSIS — N179 Acute kidney failure, unspecified: Secondary | ICD-10-CM

## 2018-07-15 HISTORY — PX: IR THORACENTESIS ASP PLEURAL SPACE W/IMG GUIDE: IMG5380

## 2018-07-15 LAB — COMPREHENSIVE METABOLIC PANEL
ALBUMIN: 2 g/dL — AB (ref 3.5–5.0)
ALT: 43 U/L (ref 0–44)
AST: 37 U/L (ref 15–41)
Alkaline Phosphatase: 123 U/L (ref 38–126)
Anion gap: 3 — ABNORMAL LOW (ref 5–15)
BUN: 13 mg/dL (ref 8–23)
CO2: 25 mmol/L (ref 22–32)
CREATININE: 0.81 mg/dL (ref 0.44–1.00)
Calcium: 8.1 mg/dL — ABNORMAL LOW (ref 8.9–10.3)
Chloride: 114 mmol/L — ABNORMAL HIGH (ref 98–111)
GFR calc Af Amer: >60 mL/min (ref >60)
GFR calc non Af Amer: >60 mL/min (ref >60)
Glucose, Bld: 104 mg/dL — ABNORMAL HIGH (ref 70–99)
Potassium: 3.9 mmol/L (ref 3.5–5.1)
Sodium: 142 mmol/L (ref 135–145)
Total Bilirubin: 0.5 mg/dL (ref 0.3–1.2)
Total Protein: 5.1 g/dL — ABNORMAL LOW (ref 6.5–8.1)

## 2018-07-15 LAB — PROTEIN, PLEURAL OR PERITONEAL FLUID: Total protein, fluid: <3 g/dL

## 2018-07-15 LAB — GRAM STAIN

## 2018-07-15 LAB — GLUCOSE, PLEURAL OR PERITONEAL FLUID: Glucose, Fluid: 126 mg/dL

## 2018-07-15 LAB — BODY FLUID CELL COUNT WITH DIFFERENTIAL
Eos, Fluid: 0 %
Lymphs, Fluid: 24 %
Monocyte-Macrophage-Serous Fluid: 13 % — ABNORMAL LOW (ref 50–90)
Neutrophil Count, Fluid: 63 % — ABNORMAL HIGH (ref 0–25)
Total Nucleated Cell Count, Fluid: 628 cu mm (ref 0–1000)

## 2018-07-15 LAB — CBC
HCT: 35.3 % — ABNORMAL LOW (ref 36.0–46.0)
Hemoglobin: 11.1 g/dL — ABNORMAL LOW (ref 12.0–15.0)
MCH: 26.4 pg (ref 26.0–34.0)
MCHC: 31.4 g/dL (ref 30.0–36.0)
MCV: 83.8 fL (ref 80.0–100.0)
Platelets: 219 10*3/uL (ref 150–400)
RBC: 4.21 MIL/uL (ref 3.87–5.11)
RDW: 18.3 % — ABNORMAL HIGH (ref 11.5–15.5)
WBC: 15.9 10*3/uL — ABNORMAL HIGH (ref 4.0–10.5)
nRBC: 0.1 % (ref 0.0–0.2)

## 2018-07-15 LAB — LACTATE DEHYDROGENASE, PLEURAL OR PERITONEAL FLUID: LD, Fluid: 199 U/L — ABNORMAL HIGH (ref 3–23)

## 2018-07-15 LAB — PROTIME-INR
INR: 1.6 — ABNORMAL HIGH (ref 0.8–1.2)
Prothrombin Time: 18.9 seconds — ABNORMAL HIGH (ref 11.4–15.2)

## 2018-07-15 LAB — ALBUMIN, PLEURAL OR PERITONEAL FLUID: Albumin, Fluid: 1.3 g/dL

## 2018-07-15 LAB — LACTATE DEHYDROGENASE: LDH: 282 U/L — ABNORMAL HIGH (ref 98–192)

## 2018-07-15 LAB — ECHOCARDIOGRAM COMPLETE
Height: 64.016 in
Weight: 2278.67 oz

## 2018-07-15 MED ORDER — LIDOCAINE HCL 1 % IJ SOLN
INTRAMUSCULAR | Status: AC
  Filled 2018-07-15: qty 20

## 2018-07-15 MED ORDER — LIDOCAINE HCL (PF) 1 % IJ SOLN
INTRAMUSCULAR | Status: AC | PRN
  Administered 2018-07-15: 10 mL

## 2018-07-15 MED ORDER — WARFARIN SODIUM 2.5 MG PO TABS
2.5000 mg | ORAL_TABLET | Freq: Once | ORAL | Status: AC
  Administered 2018-07-15: 2.5 mg via ORAL
  Filled 2018-07-15: qty 1

## 2018-07-15 NOTE — Consult Note (Signed)
Reason for Consult: T10 fracture Referring Physician: Medicine service  Becky Mcmahon is an 85 y.o. female.   HPI:  67-year-old female with multiple medical problems admitted with leukocytosis and hypotension and anemia found to have incidentally a T10 fracture on CT scan done for other reasons.  Does complain of significant back pain, especially with movement.  SHe has had good strength in her legs.  We were called with questions about pain control.  Past Medical History:  Diagnosis Date  . Anxiety   . Arthritis   . Breast cancer ([place])    Rt mastectomy  . CAD (coronary artery disease)   . Cerebral atherosclerosis    CAROTID DOPPLER, 06/18/2009 - RIGHT/LEFT BULB AND PROXIMAL ICAs-0-49% diameter reduction  . DVT (deep venous thrombosis) ([facility])    Recurrent, 1st episode after CABG 1997, 2nd episode after hip surgery 2001, placed on life-long coumadin ever since; LEXISCAN, 03/13/2008 - normal, no ECG changes, EKG negative for ischemia  . GERD (gastroesophageal reflux disease)   . Guaiac positive stools   . Hypercholesteremia   . Hyperlipidemia   . Hypertension   . Hypothyroidism   . Osteoporosis   . Ovarian cyst   . Pancreatitis   . 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 Severe aortic stenosis    2D ECHO, 04/22/2012 - EF 60-65%, aortic valve-heavily calcified with restricted leaflet motion, left atrium severly dilated  . Swelling of right lower extremity    LEA VENOUS DUPLEX SCAN, 08/18/2011 - no evidence of acute thrombus or thrombophlebitis  . Type II diabetes mellitus (HCC)    Diet controlled  . Vitamin D deficiency     Past Surgical History:  Procedure Laterality Date  . AORTIC VALVE REPLACEMENT    . CARDIOLITE MYOCARDIAL PERFUSION STUDY  06/13/2004   Normal static and dynamic myocardial perfusion images with EF of 73%  . CORONARY ARTERY BYPASS GRAFT   10/25/1995   x5 , Dr Prescott Gum  . HIP ARTHROPLASTY     right  . LAPAROTOMY    . LEFT AND RIGHT HEART CATHETERIZATION WITH CORONARY/GRAFT ANGIOGRAM N/A 05/23/2012   Procedure: LEFT AND RIGHT HEART CATHETERIZATION WITH Beatrix Fetters;  Surgeon: Sanda Klein, MD;  Location: [facility];  Service: Cardiovascular;  Laterality: N/A;  . right mastectomy  1979    Allergies  Allergen Reactions  . Contrast Media  [Iodinated Diagnostic Agents] Itching  . Keflex [Cephalexin] Rash    Severe itching  . Latex Rash  . Neosporin [Neomycin-Bacitracin Zn-Polymyx] Rash    Social History   Tobacco Use  . Smoking status: Former Smoker    Packs/day: 0.50    Years: 19.00    Pack years: 9.50    Last attempt to quit: 05/24/1969    Years since quitting: 49.1  . Smokeless tobacco: Never Used  Substance Use Topics  . Alcohol use: No    Family History  Problem Relation Age of Onset  . Pancreatitis Mother   . Heart disease Father        family HX  . Heart disease Sister   . Heart disease Brother   . Heart disease Brother 88  . Heart disease Sister 50     Review of Systems  Positive ROS: see chart  All other systems have been reviewed and were otherwise negative with the exception  of those mentioned in the HPI and as above.  Objective: Vital signs in last 24 hours: Temp:  [97.7 F (36.5 C)-98.1 F (36.7 C)] 97.7 F (36.5 C) (03/06 0754) Pulse Rate:  [81-92] 92 (03/06 0754) Resp:  [16-20] 16 (03/06 0754) BP: (130-135)/(59-76) 135/59 (03/06 0754) SpO2:  [95 %-97 %] 95 % (03/06 0754)  General Appearance: Thin elderly white female in no acute distress Head: Normocephalic, without obvious abnormality, atraumatic Eyes: PERRL, conjunctiva/corneas clear, EOM's intact     Throat: benign Neck: Supple Lungs: Durations only mildly labored Heart: Regular rate and rhythm Abdomen: Soft NEUROLOGIC:   Mental status: A&O x4, no aphasia, good attention span, Memory and fund of knowledge  seem appropriate for age Motor Exam - grossly normal, normal tone and bulk Sensory Exam - grossly normal Reflexes: symmetric Coordination - grossly normal Gait -not tested Balance -not tested Cranial Nerves: I: smell Not tested  II: visual acuity  OS: na    OD: na  II: visual fields Full to confrontation  II: pupils Equal, round, reactive to light  III,VII: ptosis None  III,IV,VI: extraocular muscles  Full ROM  V: mastication Normal  V: facial light touch sensation  Normal  V,VII: corneal reflex  Present  VII: facial muscle function - upper  Normal  VII: facial muscle function - lower Normal  VIII: hearing Not tested  IX: soft palate elevation  Normal  IX,X: gag reflex Present  XI: trapezius strength  5/5  XI: sternocleidomastoid strength 5/5  XI: neck flexion strength  5/5  XII: tongue strength  Normal    Data Review Lab Results  Component Value Date   WBC 15.9 (H) 07/15/2018   HGB 11.1 (L) 07/15/2018   HCT 35.3 (L) 07/15/2018   MCV 83.8 07/15/2018   PLT 219 07/15/2018   Lab Results  Component Value Date   NA 142 07/15/2018   K 3.9 07/15/2018   CL 114 (H) 07/15/2018   CO2 25 07/15/2018   BUN 13 07/15/2018   CREATININE 0.81 07/15/2018   GLUCOSE 104 (H) 07/15/2018   Lab Results  Component Value Date   INR 1.6 (H) 07/15/2018    Radiology: Ct Head Wo Contrast  Result Date: 07/08/2018 CLINICAL DATA:  Altered level of consciousness. EXAM: CT HEAD WITHOUT CONTRAST TECHNIQUE: Contiguous axial images were obtained from the base of the skull through the vertex without intravenous contrast. COMPARISON:  04/07/2016 FINDINGS: Brain: There is no evidence of acute infarct, intracranial hemorrhage, mass, midline shift, or extra-axial fluid collection. Cerebral atrophy is unchanged. Cerebral white matter hypodensities are similar to the prior study and nonspecific but compatible with mild chronic small vessel ischemic disease. Vascular: Calcified atherosclerosis at the skull  base. No hyperdense vessel. Skull: No fracture or focal osseous lesion. Sinuses/Orbits: Unchanged right sphenoid sinus mucous retention cyst. Clear mastoid air cells. Bilateral cataract extraction. Other: Small metallic object in the left external auditory canal, possibly a portion of a hearing aid. IMPRESSION: 1. No evidence of acute intracranial abnormality. 2. Mild chronic small vessel ischemic disease. Electronically Signed   By: Logan Bores M.D.   On: 07/08/2018 13:11   Dg Chest Portable 1 View  Result Date: 07/08/2018 CLINICAL DATA:  Hypoxia EXAM: PORTABLE CHEST 1 VIEW COMPARISON:  07/07/2018 FINDINGS: Postsurgical changes are again seen and stable. Small bilateral pleural effusions are again identified and stable given some variation in the technical factors of the film. No focal infiltrate is seen. No acute bony abnormality is noted. IMPRESSION: Stable blunting of  the costophrenic angles. No new focal abnormality is noted. Electronically Signed   By: Inez Catalina M.D.   On: 07/08/2018 12:41   Dg Foot Complete Right  Result Date: 07/08/2018 CLINICAL DATA:  Right great toe discoloration and laceration. Infection/sepsis. EXAM: RIGHT FOOT COMPLETE - 3+ VIEW COMPARISON:  07/04/2018 FINDINGS: No acute fracture or dislocation is identified. The bones are diffusely osteopenic. There is great toe swelling without evidence of underlying osseous erosion or soft tissue emphysema. Marginal osteophyte formation is most notable at the DIP joints of the second and third toes. A small plantar calcaneal enthesophyte is noted. There are extensive atherosclerotic vascular calcifications. IMPRESSION: Soft tissue swelling without evidence of acute osseous abnormality. Electronically Signed   By: Logan Bores M.D.   On: 07/08/2018 13:48   CT scan shows multilevel spondylosis with multiple osteoporotic compression fractures with a T10 distraction fracture involving the anterior 2 columns.  The posterior column is intact.   Do not see significant canal compromise.  Assessment/Plan: Estimated body mass index is 24.43 kg/m as calculated from the following:   Height as of this encounter: 5' 4.02" (1.626 m).   Weight as of this encounter: 64.6 kg.   Difficult situation in an 85 year old female with serious multiple medical problems including possible active infection, GI bleed, history of DVT, coagulation, breast cancer, severe osteoporosis with multiple thoracic compression fractures, possible new diagnosis of lung tumor who has a T10 vertebral body fracture.  This is a distraction injury and can be unstable.  Think there are significant risks either way.  Certainly, surgery would be quite aggressive and quite risky given her osteoporosis and medical status.  Without surgery, this fracture could be unstable and lead to neurologic compromise though I think the risk of this is fairly low.  Therefore, there would be a small risk of paralysis without surgery ( likely a higher risk than if she underwent stabilization surgery), but I think the overall risk of surgery is much higher than the risk of paralysis without surgery.  Also, the risk of death or prolonged intubation or other severe complication is obviously much higher with surgery.  I Think the risks of surgery outweigh the potential benefits given the situation.  The family tends to agree and they were really not interested in surgery.  We talked about bracing versus complete bedrest.  I think complete bed rest has its own risks for this patient who is compromised.  Therefore recommend mobilization in a TLSO brace.  She is a patient of Dr. Donnella Bi and will certainly let him know that she is in the hospital and he is back in town.  I think he would probably agree with this assessment.   Eustace Moore 07/15/2018 9:50 AM

## 2018-07-15 NOTE — Progress Notes (Signed)
[facility] PROGRESS NOTE  Becky Mcmahon:[id number] DOB: 1930/09/07 DOA: 07/08/2018 PCP: Prince Solian, MD  Assessment/Plan:    1. Acute hypoxic respiratory failure.  Likely secondary to bilateral moderate-sized pleural effusions.  Status post thoracentesis on 3/6 with small volume removed.  Will wean oxygen as able, currently on 3 L (room air prior to admission). BNP is slightly elevated at 298. No peripheral edema  TTE shows preserved EF, no changes at TAVR site. Had previously been on Flagyl, Zosyn and Vancomycin in early part of admission (2/28-3/2). Still has leukocytosis, CT chest mentions small nodular area that could be infectious process among other things, interestingly she has remained afebrile off antibiotics. I will hold off on initiating antibiotics unless she clinically declares with fever, worsening cough/respiratory status ( not able to wean oxygen) will also await pleural fluid data  2. Bilateral, exudatevepleural effusions, status post thoracentesis on 3/6.  270 cc of bloody fluid removed.  Cytology and fluid analysis currently pending, elevated LDH consistent with exudative.  Some concern for malignancy given increase in size of pulmonary nodule mentioned on CT chest  3. Increasing left upper lobe pulmonary nodule, noted on CT chest on 3/5 (increased in size from 2017).  Has some features that are concerning for potential primary lung malignancy.  Discussed with family who agree with further evaluation as outpatient.  4. T10 shear fracture.  Evaluated by neurosurgery who recommends mobilization and TLSO brace as family is not interested in surgery given the risk versus benefits.  Remains neurologically intact  5. Shock unclear if hemorrhagic versus septic, resolved.  Presented with abdominal pain and witnessed melena as well as significant anemia.  Had significant hypothermia, tachypnea, tachycardia and profound hypotension requiring pressor support in ICU as  well as a lactic acidosis greater than 11.  6. GI hemorrhage/acute blood loss anemia, resolved.  In setting of elevated INR 5.2 on admission.  No endoscopic interventions on admission due to profound hemodynamic instability.  Hemoglobin remained stable at 11 currently and patient tolerating being on warfarin without any active signs of bleeding.  S/p 2 units of packed red blood cells   7. Elevated INR, resolved. s/p 2 units of fresh frozen plasma to reverse INR during hospital stay.  8. 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 Acute metabolic encephalopathy, resolved in setting of severe shock is most likely contributor.  CT head negative for any acute abnormalities.  Will avoid sedating medications.  Closely monitor  10. Aortic stenosis s/p TAVR (2014).  Repeat TTE on 3/6 to evaluate valve especially in light of persistent oxygen requirements.  11. Hypernatremia, resolved. Related to dehydration related to increased sedation. Briefly needed D5.   12. AKI, resolved hours 1, stable continue Synthroid  13. Dementia, somewhat conversant and appropriate. Home aricept. Delirium precautions   Code Status: Partial  Family Communication: son and daughter updated at bedside  Disposition Plan: wean oxygen as able   Consultants:  PCCM, NSG  Procedures:  3/6 thoracentesis  Intubation  Antibiotics: Doxycycline 2/24 > 2/28 Aztreonam 2/28 Zosyn 2/28 > 3/2 Vancomycin 2/28 >3/1  HPI/Subjective:  Becky Mcmahon is a 84 y.o. year old female with medical history significant for CAD s/p CABG, TAVR 2014 [facility]) breast CA s/p R mastectomy, recurrent DVTs, HTN, HLD, mild dementia, cerebral atherosclerosis, and a compression fracture who presented on 07/08/2018 with reports of nausea and abdominal pain prompting ED visit on  2/27 before being found minimally responsive by her daughter prior to admission and was  found to have profound shock unclear if hemorrhagic or septic, .  Feels ok No complaints No CP, abdominal pain   Objective: Vitals:   07/15/18 0754 07/15/18 1619  BP: (!) 135/59 (!) 156/68  Pulse: 92 95  Resp: 16 19  Temp: 97.7 F (36.5 C) 98.4 F (36.9 C)  SpO2: 95% 93%   No intake or output data in the 24 hours ending 07/15/18 1707 Filed Weights   07/09/18 2002 07/10/18 0331 07/12/18 0400  Weight: 65 kg 69 kg 64.6 kg    Exam:   General:  Lying in bed, no distress  Cardiovascular: RRR, no peripheral edema  Respiratory: normal respiratory effort on 3 L, decreased breath sounds at base on right, no wheezing or crackles  Abdomen: soft, Non-distended, non-tender, normal bowel soudns  Musculoskeletal: normal ROM   Skin no rashes or lesions  Neurologic alert and oriented x4, no appreciable focal deficits  Data Reviewed: Basic Metabolic Panel: Recent Labs  Lab 07/09/18 0630 07/10/18 0614  07/12/18 0343 07/13/18 0234 07/13/18 1129 07/14/18 0230 07/15/18 0234  NA 148* 154*   < > 147* 145 145 146* 142  K 3.9 3.5   < > 3.2* 2.5* 3.5 3.3* 3.9  CL 125* 129*   < > 123* 117* 117* 114* 114*  CO2 13* 15*   < > 16* 21* 21* 25 25  GLUCOSE 114* 127*   < > 137* 140* 135* 103* 104*  BUN 57* 44*   < > 28* 19 16 15 13   CREATININE 1.20* 1.36*   < > 1.26* 0.98 0.87 0.88 0.81  CALCIUM 8.3* 8.5*   < > 8.2* 8.0* 8.1* 7.8* 8.1*  MG 1.9 2.1  --   --  1.8  --  1.8  --   PHOS 3.0  --   --   --   --   --   --   --    < > = values in this interval not displayed.   Liver Function Tests: Recent Labs  Lab 07/12/18 0343 07/13/18 0234 07/14/18 0230 07/15/18 0234  AST 26 55* 46* 37  ALT 26 42 46* 43  ALKPHOS 72 83 102 123  BILITOT 0.7 0.6 0.3 0.5  PROT 4.6* 4.9* 4.9* 5.1*  ALBUMIN 2.0* 2.1* 1.9* 2.0*   No results for input(s): LIPASE, AMYLASE in the last 168 hours. No results for input(s): AMMONIA in the last 168 hours. CBC: Recent Labs  Lab 07/12/18 0343 07/13/18 0234  07/13/18 1701 07/14/18 0600 07/15/18 0804  WBC 16.2* 16.2* 19.0* 16.9* 15.9*  NEUTROABS  --   --  16.7* 13.7*  --   HGB 10.8* 11.1* 11.2* 11.0* 11.1*  HCT 33.7* 33.4* 35.2* 34.1* 35.3*  MCV 85.3 82.1 82.6 83.6 83.8  PLT PLATELET CLUMPS NOTED ON SMEAR, UNABLE TO ESTIMATE 151 156 186 219   Cardiac Enzymes: No results for input(s): CKTOTAL, CKMB, CKMBINDEX, TROPONINI in the last 168 hours. BNP (last 3 results) Recent Labs    07/08/18 1218 07/13/18 1701  BNP 122.7* 298.3*    ProBNP (last 3 results) No results for input(s): PROBNP in the last 8760 hours.  CBG: Recent Labs  Lab 07/11/18 1706 07/11/18 2107 07/11/18 2252 07/12/18 0737 07/12/18 1219  GLUCAP 134* 150* 138* 115* 148*    Recent Results (from the past 240 hour(s))  Urine Culture     Status: Abnormal   Collection Time: 07/07/18  5:17 PM  Result Value Ref Range Status   Specimen Description   Final    URINE, RANDOM Performed at [facility] [address] [place], [place] [address]    Special Requests   Final    NONE Performed at [facility], [place] [address] [place], [place] [address]    Culture (A)  Final    <10,000 COLONIES/mL INSIGNIFICANT GROWTH Performed at [facility] [address] [place], [place] [address]    Report Status 07/09/2018 FINAL  Final  Culture, blood (routine x 2)     Status: None   Collection Time: 07/08/18 12:19 PM  Result Value Ref Range Status   Specimen Description BLOOD LEFT ANTECUBITAL  Final   Special Requests   Final    BOTTLES DRAWN AEROBIC AND ANAEROBIC Blood Culture adequate volume   Culture   Final    NO GROWTH 5 DAYS Performed at [facility], [place] [address] [place], [place] [address]    Report Status 07/13/2018 FINAL  Final  Urine culture     Status: None   Collection Time: 07/08/18 12:33 PM  Result Value Ref Range Status   Specimen Description URINE, CATHETERIZED  Final   Special Requests NONE  Final    Culture   Final    NO GROWTH Performed at [facility], [place] [address], [place], [place] [address]    Report Status 07/09/2018 FINAL  Final  Culture, blood (routine x 2)     Status: None   Collection Time: 07/08/18 12:55 PM  Result Value Ref Range Status   Specimen Description BLOOD RIGHT WRIST  Final   Special Requests   Final    BOTTLES DRAWN AEROBIC AND ANAEROBIC Blood Culture results may not be optimal due to an inadequate volume of blood received in culture bottles   Culture   Final    NO GROWTH 5 DAYS Performed at [facility], [place] [address], [place], [place] [address]    Report Status 07/13/2018 FINAL  Final  MRSA PCR Screening     Status: None   Collection Time: 07/09/18  3:00 AM  Result Value Ref Range Status   MRSA by PCR NEGATIVE NEGATIVE Final    Comment:        The GeneXpert MRSA Assay (FDA approved for NASAL specimens only), is one component of a comprehensive MRSA colonization surveillance program. It is not intended to diagnose MRSA infection nor to guide or monitor treatment for MRSA infections. Performed at [facility], [place] [address], [place], [place] [address]      Studies: Dg Chest 1 View  Result Date: 07/15/2018 CLINICAL DATA:  Post thoracentesis EXAM: CHEST  1 VIEW COMPARISON:  07/14/2018, 07/13/2018, 07/08/2018 FINDINGS: Small bilateral pleural effusions, right greater than left, decreased on the right side. No definitive pneumothorax. Cardiomegaly with enlarged mediastinal silhouette, aortic atherosclerosis, and ascending aortic graft. Dense bibasilar airspace disease. Large hiatal hernia. IMPRESSION: 1. Small bilateral pleural effusions, slightly decreased on the right side. No definite right pneumothorax 2. Stable cardiomediastinal silhouette with bibasilar airspace disease Electronically Signed   By: Donavan Foil M.D.   On: 07/15/2018 16:10   Ct Chest Wo Contrast  Result Date: 07/14/2018 CLINICAL DATA:  Shock and altered mental  status with unclear etiology. Two recent falls. Status post CABG status post right mastectomy for breast cancer. EXAM: CT CHEST WITHOUT CONTRAST TECHNIQUE: Multidetector CT imaging of the chest was performed following  the standard protocol without IV contrast. COMPARISON:  Portable chest obtained earlier today. Chest CT dated 04/07/2016. FINDINGS: Cardiovascular: Post CABG changes and aortic valve stent are again demonstrated. Dense coronary artery and aortic calcifications. Normal sized heart. Diffuse low density of the blood relative to the arterial walls. Dense bilateral carotid artery calcifications. Mediastinum/Nodes: No significant change in a large hiatal hernia. No enlarged lymph nodes. Unremarkable thyroid gland. Lungs/Pleura: The previously demonstrated 8 x 5 mm nodular density in the left upper lobe has a less linear and more irregular appearance today, measuring 13 x 9 mm on image number 42 series 4. Stable linear scarring more anteriorly in the left upper lobe. Stable small calcified granuloma more inferiorly in the left upper lobe. Interval adjacent 4 mm and 3 mm nodular densities in the central right upper lobe on images 39 and 37 respectively. At least a portion of the 4 mm nodular density appears represent a small focal mucous plug small or small endobronchial nodule. Small to moderate-sized right pleural effusion and small left pleural effusion. Associated bilateral lower lobe compressive atelectasis, right greater than left. There is also mild dependent atelectasis in the right upper lobe. Upper Abdomen: Extensive atheromatous arterial calcifications. Cholecystectomy clips. Musculoskeletal: Stable 75% L1 vertebral compression deformity. Interval 90% T7 and 70% T9 compression deformities with no visible acute fracture lines. Interval left transverse shear fracture through the inferior aspect of the T10 vertebral body. The inferior fragment is fused with the T11 vertebral body. There is 1.4 cm of  distraction of the fragments anteriorly with acute lordosis. Acute fracture lines are visualized. The posterior elements are intact. IMPRESSION: 1. Interval acute transverse shear fracture through the inferior aspect of the T10 vertebral body with 1.4 cm of distraction of the fragments anteriorly and associated acute lordosis. The posterior elements are intact. 2. 13 x 9 mm enlarging, irregular nodule in the left upper lobe with features highly suspicious for a small primary lung carcinoma. 3. Interval adjacent 4 mm and 3 mm nodular densities in the central right upper lobe. A portion of the 4 mm nodular density is endobronchial in location. These could represent small metastases, areas of mucous plugging or additional primary malignancy. A nodular infectious process is also possible. 4. Interval small to moderate-sized right pleural effusion and small left pleural effusion. 5. Bilateral lower lobe compressive atelectasis, right greater than left. 6. Stable large hiatal hernia. 7. Post CABG changes with dense calcific coronary artery and aortic atherosclerosis. 8. Bilateral carotid artery dense calcific atherosclerosis. Critical Value/emergent results were called by telephone at the time of interpretation on 07/14/2018 at 5:20 pm to Dr. Berle Mull , who verbally acknowledged these results. Electronically Signed   By: Claudie Revering M.D.   On: 07/14/2018 17:53   Dg Chest Port 1 View  Result Date: 07/14/2018 CLINICAL DATA:  Chest tube present.  Right pleural effusion. EXAM: PORTABLE CHEST 1 VIEW COMPARISON:  07/13/2018 FINDINGS: Stable cardiomegaly. Stable bilateral pleural effusions and volume loss in the lungs. Large hiatal hernia is noted. No pneumothorax. Normal vascularity. Postoperative changes. IMPRESSION: Stable bilateral pleural effusions. Electronically Signed   By: Marybelle Killings M.D.   On: 07/14/2018 10:07   Ir Thoracentesis Asp Pleural Space W/img Guide  Result Date: 07/15/2018 INDICATION: Shortness of  breath. Right-sided pleural effusion. Request diagnostic and therapeutic thoracentesis. EXAM: ULTRASOUND GUIDED RIGHT THORACENTESIS MEDICATIONS: None. COMPLICATIONS: None immediate. Postprocedural chest x-ray negative for pneumothorax PROCEDURE: An ultrasound guided thoracentesis was thoroughly discussed with the patient and questions answered. The  benefits, risks, alternatives and complications were also discussed. The patient understands and wishes to proceed with the procedure. Written consent was obtained. Ultrasound was performed to localize and mark an adequate pocket of fluid in the right chest. The area was then prepped and draped in the normal sterile fashion. 1% Lidocaine was used for local anesthesia. Under ultrasound guidance a 6 Fr Safe-T-Centesis catheter was introduced. Thoracentesis was performed. The catheter was removed and a dressing applied. FINDINGS: A total of approximately 270 mL of thin, bloody fluid was removed. Samples were sent to the laboratory as requested by the clinical team. IMPRESSION: Successful ultrasound guided right thoracentesis yielding 270 mL of pleural fluid. Read by: Ascencion Dike PA-C Electronically Signed   By: Jerilynn Mages.  Shick M.D.   On: 07/15/2018 15:47    Scheduled Meds: . atorvastatin  20 mg Oral QPM  . calcitonin (salmon)  1 spray Alternating Nares Daily  . donepezil  5 mg Oral QHS  . levothyroxine  50 mcg Oral QAC breakfast  . lidocaine  2 patch Transdermal Q24H  . lidocaine      . pantoprazole  40 mg Oral BID AC  . warfarin  2.5 mg Oral ONCE-1800  . Warfarin - Pharmacist Dosing Inpatient   Does not apply q1800   Continuous Infusions:  Active Problems:   Goals of care, counseling/discussion   Septic shock (HCC)   Encephalopathy acute   Pressure injury of skin   Thoracic vertebral fracture (HCC)   Severe sepsis with septic shock (HCC)   Lactic acidosis   AKI (acute kidney injury) ([place])   Elevated INR   Recurrent right pleural effusion   Palliative  care by specialist      Desiree Hane  [facility]

## 2018-07-15 NOTE — Consult Note (Signed)
Consultation Note Date: 07/15/2018   Patient Name: Becky Mcmahon  DOB: 1931-02-02  MRN: [id number]  Age / Sex: 85 y.o., female  PCP: Prince Solian, MD Referring Physician: Desiree Hane, MD  Reason for Consultation: Establishing goals of care and Psychosocial/spiritual support  HPI/Patient Profile: 85 y.o. female  with past medical history of aortic stenosis status post TAVR 2014, acute on chronic congestive heart failure, coronary artery disease status post CABG, history of breast cancer status post right mastectomy, history of DVT, severe osteoporosis admitted on 07/08/2018 with severe back pain, GI bleed and septic shock.  CTC revealed new, acute shear fracture at T10.  Patient also has a known compression fracture at L1.  CT scan of the chest revealed bilateral pleural effusions.  Patient has had a left upper lobe nodule that has been monitored since 2017.  In 2017 it was 8 mm x 5 mm.  CT of the chest performed on 07/14/2018 shows nodule now 13 mm x 9 mm.  Consult ordered for goals of care.   Clinical Assessment and Goals of Care: Patient seen, chart reviewed.  Patient's spouse and daughter Lenna Sciara are at the bedside.  Patient is extremely hard of hearing.  Family shares that patient has had to be in a back brace in the past and was able to tolerate this.  Family was able to articulate that surgical repair of acute shear fracture at T10 was more risk than benefit after their conversation with neurosurgery.  They are hopeful that with support from the brace that she can heal on her own.  Patient has bilateral pleural effusions, enlarging left upper lobe nodule.  She is going to undergo repeat echocardiogram today as well as another thoracentesis  Patient is extremely hard of hearing however she can participate in goals of care discussion.  Her healthcare proxy, would be her husband, in the event she were  unable to speak for herself.  It appears as though they are making decisions as a family including their daughters    SUMMARY OF RECOMMENDATIONS   Confirmed that patient is a partial code: yes to intubation and BiPAP but no CPR no pressors no defibrillation Palliative medicine to stay involved and work through other goals of care questions as we receive more information from pathology of thoracentesis (is this reoccurrence of breast cancer, malignant effusion, etc), echo Per chart review, patient will be having outpatient follow-up on enlarging left upper lobe lung nodule.  I anticipate patient needing ongoing goals of care as she receives information.  She is agreeable to thoracentesis and wearing back brace Outpatient, community based palliative care can be accessed through care connection at [phone] or hospice and palliative care of [facility] palliative medicine division, [phone] Code Status/Advance Care Planning:  Limited code    Symptom Management:   Pain: Continue with Miacalcin for osteoporosis which can also help with bone pain.  NSAIDs and steroids would also be beneficial but contraindicated in the setting of GI bleed.  Continue with tramadol 50 mg every 6  hours as needed as well as Robaxin 250 mg every 8 hours as needed  Anxiety: Continue with Ativan 0.25 mg twice daily as needed  Palliative Prophylaxis:   Aspiration, Bowel Regimen, Delirium Protocol, Eye Care, Frequent Pain Assessment, Oral Care and Turn Reposition  Additional Recommendations (Limitations, Scope, Preferences):  Full Scope Treatment  Psycho-social/Spiritual:   Desire for further Chaplaincy support:no  Additional Recommendations: Referral to Community Resources   Prognosis:   Unable to determine  Discharge Planning: To Be Determined      Primary Diagnoses: Present on Admission: . Shock ([place]) . Severe sepsis with septic shock ([place]) . Lactic acidosis . AKI (acute kidney injury)  ([place]) . Elevated INR . Bilateral pleural effusion   I have reviewed the medical record, interviewed the patient and family, and examined the patient. The following aspects are pertinent.  Past Medical History:  Diagnosis Date  . Anxiety   . Arthritis   . Breast cancer ([place])    Rt mastectomy  . CAD (coronary artery disease)   . Cerebral atherosclerosis    CAROTID DOPPLER, 06/18/2009 - RIGHT/LEFT BULB AND PROXIMAL ICAs-0-49% diameter reduction  . DVT (deep venous thrombosis) ([facility])    Recurrent, 1st episode after CABG 1997, 2nd episode after hip surgery 2001, placed on life-long coumadin ever since; LEXISCAN, 03/13/2008 - normal, no ECG changes, EKG negative for ischemia  . GERD (gastroesophageal reflux disease)   . Guaiac positive stools   . Hypercholesteremia   . Hyperlipidemia   . Hypertension   . Hypothyroidism   . Osteoporosis   . Ovarian cyst   . Pancreatitis   . 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 Severe aortic stenosis    2D ECHO, 04/22/2012 - EF 60-65%, aortic valve-heavily calcified with restricted leaflet motion, left atrium severly dilated  . Swelling of right lower extremity    LEA VENOUS DUPLEX SCAN, 08/18/2011 - no evidence of acute thrombus or thrombophlebitis  . Type II diabetes mellitus ([facility])    Diet controlled  . Vitamin D deficiency    Social History   Socioeconomic History  . Marital status: Married    Spouse name: Not on file  . Number of children: Not on file  . Years of education: Not on file  . Highest education level: Not on file  Occupational History  . Occupation: retired Psychologist, counselling: [employer]  . Financial resource strain: Not on file  . Food insecurity:    Worry: Not on file    Inability: Not on file  . Transportation needs:    Medical: Not on file    Non-medical: Not on  file  Tobacco Use  . Smoking status: Former Smoker    Packs/day: 0.50    Years: 19.00    Pack years: 9.50    Last attempt to quit: 05/24/1969    Years since quitting: 49.1  . Smokeless tobacco: Never Used  Substance and Sexual Activity  . Alcohol use: No  . Drug use: No  . Sexual activity: Not on file  Lifestyle  . Physical activity:    Days per week: Not on file    Minutes per session: Not on file  . Stress: Not on file  Relationships  . Social connections:    Talks on phone: Not on file  Gets together: Not on file    Attends religious service: Not on file    Active member of club or organization: Not on file    Attends meetings of clubs or organizations: Not on file    Relationship status: Not on file  Other Topics Concern  . Not on file  Social History Narrative   Lives with husband and 1 daughter, 2nd daughter lives in [place] physically active and functionally independent   Active in church   Family History  Problem Relation Age of Onset  . Pancreatitis Mother   . Heart disease Father        family HX  . Heart disease Sister   . Heart disease Brother   . Heart disease Brother 82  . Heart disease Sister 37   Scheduled Meds: . atorvastatin  20 mg Oral QPM  . calcitonin (salmon)  1 spray Alternating Nares Daily  . donepezil  5 mg Oral QHS  . levothyroxine  50 mcg Oral QAC breakfast  . lidocaine  2 patch Transdermal Q24H  . pantoprazole  40 mg Oral BID AC  . warfarin  2.5 mg Oral ONCE-1800  . Warfarin - Pharmacist Dosing Inpatient   Does not apply q1800   Continuous Infusions: PRN Meds:.acetaminophen, LORazepam, methocarbamol, traMADol Medications Prior to Admission:  Prior to Admission medications   Medication Sig Start Date End Date Taking? Authorizing Provider  Ascorbic Acid (VITAMIN C) 1000 MG tablet Take 1,000 mg by mouth daily.     [provider]  aspirin EC 81 MG tablet Take 81 mg by mouth every evening.     [provider]  atorvastatin (LIPITOR) 20 MG tablet Take 20 mg by mouth every evening.  08/13/14   [provider]  cetirizine (ZYRTEC) 10 MG tablet Take 10 mg by mouth daily.    [provider]  Cholecalciferol (VITAMIN D) 2000 units tablet Take 2,000 Units by mouth every evening.     [provider]  donepezil (ARICEPT) 5 MG tablet Take 5 mg by mouth at bedtime.  06/28/18   [provider]  doxycycline (VIBRA-TABS) 100 MG tablet Take 1 tablet (100 mg total) by mouth 2 (two) times daily. 07/04/18   Edrick Kins, DPM  fluticasone (FLONASE) 50 MCG/ACT nasal spray Place 1 spray into both nostrils daily.    [provider]  ibuprofen (ADVIL,MOTRIN) 200 MG tablet Take 200 mg by mouth every 6 (six) hours as needed for moderate pain.    [provider]  levothyroxine (SYNTHROID, LEVOTHROID) 50 MCG tablet Take 50 mcg by mouth daily before breakfast.     [provider]  lisinopril (PRINIVIL,ZESTRIL) 10 MG tablet Take 10 mg by mouth daily.  07/07/16   [provider]  LORazepam (ATIVAN) 0.5 MG tablet Take 0.5 mg by mouth 2 (two) times daily. Takes at lunchtime and bedtime.    [provider]  mirtazapine (REMERON) 7.5 MG tablet Take 7.5 mg by mouth at bedtime.  06/28/18   [provider]  Multiple Vitamin (MULTI-VITAMINS) TABS Take 1 tablet by mouth daily.     [provider]  Multiple Vitamins-Minerals (MULTIVITAMIN PO) Take 1 tablet by mouth daily.     [provider]  nystatin (NYSTATIN) powder Apply 1 g topically daily. Applies under breasts 05/12/16   [provider]  warfarin (COUMADIN) 5 MG tablet Take 2.5-5 mg by mouth See admin instructions. Takes 2.5 mg daily with supper except takes 5 mg  on Mon, Wed, Fri, Sat    [provider]   Allergies  Allergen Reactions  . Contrast Media  [Iodinated Diagnostic Agents] Itching  . Keflex [Cephalexin] Rash    Severe itching  . Latex Rash  .  Neosporin [Neomycin-Bacitracin Zn-Polymyx] Rash   Review of Systems  Unable to perform ROS: Mental status change    Physical Exam Vitals signs and nursing note reviewed.  Constitutional:      Appearance: Normal appearance.  HENT:     Head: Normocephalic and atraumatic.  Cardiovascular:     Rate and Rhythm: Normal rate.  Pulmonary:     Comments: Increased work of breathing at rest.  Patient subjectively denies feeling short of breath Skin:    General: Skin is warm and dry.  Neurological:     Mental Status: She is alert.     Comments: Hard of hearing  Psychiatric:     Comments: Affect constricted     Vital Signs: BP (!) 135/59 (BP Location: Left Arm)   Pulse 92   Temp 97.7 F (36.5 C) (Oral)   Resp 16   Ht 5' 4.02" (1.626 m)   Wt 64.6 kg   SpO2 95%   BMI 24.43 kg/m  Pain Scale: 0-10 POSS *See Group Information*: 2-Acceptable,Slightly drowsy, easily aroused Pain Score: 0-No pain   SpO2: SpO2: 95 % O2 Device:SpO2: 95 % O2 Flow Rate: .O2 Flow Rate (L/min): 3 L/min  IO: Intake/output summary: No intake or output data in the 24 hours ending 07/15/18 1043  LBM: Last BM Date: 07/14/18 Baseline Weight: Weight: 65 kg Most recent weight: Weight: 64.6 kg     Palliative Assessment/Data:   Flowsheet Rows     Most Recent Value  Intake Tab  Referral Department  Hospitalist  Unit at Time of Referral  Med/Surg Unit  Palliative Care Primary Diagnosis  Pulmonary  Date Notified  07/14/18  Palliative Care Type  New Palliative care  Reason for referral  Clarify Goals of Care, Counsel Regarding Hospice  Date of Admission  08/06/18  Date first seen by Palliative Care  07/15/18  # of days Palliative referral response time  1 Day(s)  # of days IP prior to Palliative referral  -23  Clinical Assessment  Palliative Performance Scale Score  40%  Pain Max last 24 hours  Not able to report  Pain Min Last 24 hours  Not able to report  Dyspnea Max Last 24 Hours  Not able to report    Dyspnea Min Last 24 hours  Not able to report  Nausea Max Last 24 Hours  Not able to report  Nausea Min Last 24 Hours  Not able to report  Anxiety Max Last 24 Hours  Not able to report  Anxiety Min Last 24 Hours  Not able to report  Other Max Last 24 Hours  Not able to report  Psychosocial & Spiritual Assessment  Palliative Care Outcomes  Patient/Family meeting held?  Yes  Who was at the meeting?  pt, spouse, dtr  Palliative Care Outcomes  Provided psychosocial or spiritual support  Patient/Family wishes: Interventions discontinued/not started   [facility]  Palliative Care follow-up planned  Yes, Facility      Time In: 0930 Time Out:  1025 Time Total: 55 min Greater than 50%  of this time was spent counseling and coordinating care related to the above assessment and plan.Staffed with Dr. Teryl Lucy  Signed by: Dory Horn, NP   Please contact Palliative Medicine Team phone at  [phone] for questions and concerns.  For individual provider: See Shea Evans

## 2018-07-15 NOTE — Progress Notes (Signed)
ANTICOAGULATION CONSULT NOTE - Follow Up Consult  Pharmacy Consult for Coumadin Indication:  hx DVT, on lifelong anticoagulation  Allergies  Allergen Reactions  . Contrast Media  [Iodinated Diagnostic Agents] Itching  . Keflex [Cephalexin] Rash    Severe itching  . Latex Rash  . Neosporin [Neomycin-Bacitracin Zn-Polymyx] Rash    Patient Measurements: Height: 5' 4.02" (162.6 cm) Weight: 142 lb 6.7 oz (64.6 kg) IBW/kg (Calculated) : 54.74  Vital Signs: Temp: 97.7 F (36.5 C) (03/06 0754) Temp Source: Oral (03/06 0754) BP: 135/59 (03/06 0754) Pulse Rate: 92 (03/06 0754)  Labs: Recent Labs    07/13/18 0234 07/13/18 1129 07/13/18 1701 07/14/18 0230 07/14/18 0600 07/15/18 0234 07/15/18 0804  HGB 11.1*  --  11.2*  --  11.0*  --  11.1*  HCT 33.4*  --  35.2*  --  34.1*  --  35.3*  PLT 151  --  156  --  186  --  219  LABPROT 17.4*  --   --  18.9*  --  18.9*  --   INR 1.4*  --   --  1.6*  --  1.6*  --   CREATININE 0.98 0.87  --  0.88  --  0.81  --     Estimated Creatinine Clearance: 43.8 mL/min (by C-G formula based on SCr of 0.81 mg/dL).   Assessment:   85 yr old female on lifelong Coumadin PTA for hx DVT x 2.  Admitted 2/28 with GI bleed and hemorrhagic shock.  INR was 5.2. Coumadin reversed with KCentra, FFP and Vitamin K. No evidence of ongoing bleeding. Coumadin resumed 3/3 without bridge. Plan conservative dosing for slow rise to therapeutic INR. Continues on Protonix 40 mg IV q12h.      INR 1.6 after Coumadin 2.5 mg daily x 3 days. Hgb stable.  Minor nosebleed 3/5 s am, possibly due to nasal cannula/oxygen.  RN humidified O2.  No bleeding reported today.    PTA Coumadin regimen:  5 mg MWFSat, 2.5 mg TTSun.  INR 5.2 on admit 2/28.  Daughter reports that recent outpatient INR had been just below goal and 5 mg doses were increased from three to four times a week.  Goal of Therapy:  INR 2-3 Monitor platelets by anticoagulation protocol: Yes   Plan:   Coumadin 2.5 mg  today.  Daily PT/INR.   Arman Bogus, [place] Pager: [phone] or phone: [phone] 07/15/2018,9:22 AM

## 2018-07-15 NOTE — Procedures (Signed)
PROCEDURE SUMMARY:  Successful US guided right thoracentesis. Yielded 270 mL of thin bloody fluid. Pt tolerated procedure well. No immediate complications.  Specimen was sent for labs. CXR ordered.  EBL < 5 mL  Ascencion Dike PA-C 07/15/2018 3:46 PM

## 2018-07-15 NOTE — Progress Notes (Signed)
  Echocardiogram 2D Echocardiogram has been performed.  Johny Chess 07/15/2018, 11:16 AM

## 2018-07-16 DIAGNOSIS — J9601 Acute respiratory failure with hypoxia: Secondary | ICD-10-CM | POA: Clinically undetermined

## 2018-07-16 LAB — CBC
HCT: 33.9 % — ABNORMAL LOW (ref 36.0–46.0)
Hemoglobin: 10.7 g/dL — ABNORMAL LOW (ref 12.0–15.0)
MCH: 26 pg (ref 26.0–34.0)
MCHC: 31.6 g/dL (ref 30.0–36.0)
MCV: 82.5 fL (ref 80.0–100.0)
Platelets: 260 10*3/uL (ref 150–400)
RBC: 4.11 MIL/uL (ref 3.87–5.11)
RDW: 17.3 % — ABNORMAL HIGH (ref 11.5–15.5)
WBC: 14.3 10*3/uL — ABNORMAL HIGH (ref 4.0–10.5)
nRBC: 0 % (ref 0.0–0.2)

## 2018-07-16 LAB — PROTIME-INR
INR: 1.7 — ABNORMAL HIGH (ref 0.8–1.2)
Prothrombin Time: 19.3 seconds — ABNORMAL HIGH (ref 11.4–15.2)

## 2018-07-16 MED ORDER — WARFARIN SODIUM 2.5 MG PO TABS
2.5000 mg | ORAL_TABLET | Freq: Once | ORAL | Status: AC
  Administered 2018-07-16: 2.5 mg via ORAL
  Filled 2018-07-16: qty 1

## 2018-07-16 NOTE — Progress Notes (Signed)
[facility] PROGRESS NOTE  Becky PLEITEZ DXA:[id number] DOB: 1930/10/24 DOA: 07/08/2018 PCP: Becky Solian, MD  Assessment/Plan:    1. Acute hypoxic respiratory failure, stable.  Likely secondary to bilateral moderate-sized pleural effusions.  Status post thoracentesis on 3/6 with small volume removed.  Will wean oxygen as able, currently on 4 L (room air prior to admission). BNP is slightly elevated at 298. No peripheral edema  TTE shows preserved EF, no changes at TAVR site. Had previously been on Flagyl, Zosyn and Vancomycin in early part of admission (2/28-3/2). Leukocytosis is improving, CT chest mentions small nodular area that could be infectious process among other things, interestingly she has remained afebrile off antibiotics. I will hold off on initiating antibiotics unless she clinically declares with fever, worsening cough/respiratory status ( not able to wean oxygen) will also await pleural fluid data for cytology and culture data.   2. Bilateral, exudatevepleural effusions, improved.  status post thoracentesis on 3/6.  270 cc of bloody fluid removed. Repeat CXR shows decrease in size.  Cytology and culture currently pending, elevated LDH consistent with exudative.  Some concern for malignancy given increase in size of pulmonary nodule mentioned on CT chest  3. Increasing left upper lobe pulmonary nodule, noted on CT chest on 3/5 (increased in size from 2017).  Has some features that are concerning for potential primary lung malignancy.  Discussed with family who agree with further evaluation as outpatient.  4. T10 shear fracture.  Evaluated by neurosurgery who recommends mobilization and TLSO brace as family is not interested in surgery given the risk versus benefits.  Remains neurologically intact. Will have PT reevaluate. Family doesn't seem interested in SNF but HHPT instead  5. Shock unclear if hemorrhagic versus septic, resolved.  Presented with abdominal pain and  witnessed melena as well as significant anemia.  Had significant hypothermia, tachypnea, tachycardia and profound hypotension requiring pressor support in ICU as well as a lactic acidosis greater than 11.  6. GI hemorrhage/acute blood loss anemia, resolved.  In setting of elevated INR 5.2 on admission.  No endoscopic interventions on admission due to profound hemodynamic instability.  Hemoglobin remained stable at 11 currently and patient tolerating being on warfarin without any active signs of bleeding.  S/p 2 units of packed red blood cells   7. Elevated INR, resolved. s/p 2 units of fresh frozen plasma to reverse INR during hospital stay.  8. Hx of DVT, on lifelong warfarin ( 2 episodes of DVT), continue warfarin and monitor closely for bleeding, check iNR daily- goal INR of 2 (not at goal yet)  9. Acute metabolic encephalopathy, resolved in setting of severe shock is most likely contributor.  CT head negative for any acute abnormalities.  Will avoid sedating medications.  Closely monitor  10. Aortic stenosis s/p TAVR (2014).  Repeat TTE on 3/6 shows preserved function.  11. Hypernatremia, resolved. Related to dehydration related to increased sedation. Briefly needed D5.   12. AKI, resolved hours 1, stable continue Synthroid  13. Dementia, somewhat conversant and appropriate. Home aricept. Delirium precautions   Code Status: Partial  Family Communication: husband and daughter updated at bedside  Disposition Plan: wean oxygen as able, PT eval with brace, therapeutic INR   Consultants:  PCCM, NSG  Procedures:  3/6 thoracentesis  Intubation  Antibiotics: Doxycycline 2/24 > 2/28 Aztreonam 2/28 Zosyn 2/28 > 3/2 Vancomycin 2/28 >3/1  HPI/Subjective:  Becky Mcmahon is a 85 y.o. year old female with medical history significant for CAD s/p CABG, TAVR  2014 [facility]) breast CA s/p R mastectomy, recurrent DVTs, HTN, HLD, mild dementia, cerebral atherosclerosis, and a compression  fracture who presented on 07/08/2018 with reports of nausea and abdominal pain prompting ED visit on 2/27 before being found minimally responsive by her daughter prior to admission and was found to have profound shock unclear if hemorrhagic or septic, .  Feels ok No complaints No CP, abdominal pain   Objective: Vitals:   07/15/18 2242 07/16/18 0900  BP: 137/68 140/76  Pulse: 83 79  Resp: 16 17  Temp: 98.4 F (36.9 C) 98.2 F (36.8 C)  SpO2: 97% 97%   No intake or output data in the 24 hours ending 07/16/18 1305 Filed Weights   07/10/18 0331 07/12/18 0400 07/16/18 0500  Weight: 69 kg 64.6 kg 58.9 kg    Exam:   General:  Lying in bed, no distress  Cardiovascular: RRR, no peripheral edema  Respiratory: improved respiratory effort on 4 L, decreased breath sounds at base on right but difficult to appreciate due to positioning, no wheezing or crackles  Abdomen: soft, Non-distended, non-tender, normal bowel soudns  Musculoskeletal: normal ROM   Skin no rashes or lesions  Neurologic alert and oriented x4, no appreciable focal deficits  Data Reviewed: Basic Metabolic Panel: Recent Labs  Lab 07/10/18 0614  07/12/18 0343 07/13/18 0234 07/13/18 1129 07/14/18 0230 07/15/18 0234  NA 154*   < > 147* 145 145 146* 142  K 3.5   < > 3.2* 2.5* 3.5 3.3* 3.9  CL 129*   < > 123* 117* 117* 114* 114*  CO2 15*   < > 16* 21* 21* 25 25  GLUCOSE 127*   < > 137* 140* 135* 103* 104*  BUN 44*   < > 28* 19 16 15 13   CREATININE 1.36*   < > 1.26* 0.98 0.87 0.88 0.81  CALCIUM 8.5*   < > 8.2* 8.0* 8.1* 7.8* 8.1*  MG 2.1  --   --  1.8  --  1.8  --    < > = values in this interval not displayed.   Liver Function Tests: Recent Labs  Lab 07/12/18 0343 07/13/18 0234 07/14/18 0230 07/15/18 0234  AST 26 55* 46* 37  ALT 26 42 46* 43  ALKPHOS 72 83 102 123  BILITOT 0.7 0.6 0.3 0.5  PROT 4.6* 4.9* 4.9* 5.1*  ALBUMIN 2.0* 2.1* 1.9* 2.0*   No results for input(s): LIPASE, AMYLASE in the  last 168 hours. No results for input(s): AMMONIA in the last 168 hours. CBC: Recent Labs  Lab 07/13/18 0234 07/13/18 1701 07/14/18 0600 07/15/18 0804 07/16/18 0846  WBC 16.2* 19.0* 16.9* 15.9* 14.3*  NEUTROABS  --  16.7* 13.7*  --   --   HGB 11.1* 11.2* 11.0* 11.1* 10.7*  HCT 33.4* 35.2* 34.1* 35.3* 33.9*  MCV 82.1 82.6 83.6 83.8 82.5  PLT 151 156 186 219 260   Cardiac Enzymes: No results for input(s): CKTOTAL, CKMB, CKMBINDEX, TROPONINI in the last 168 hours. BNP (last 3 results) Recent Labs    07/08/18 1218 07/13/18 1701  BNP 122.7* 298.3*    ProBNP (last 3 results) No results for input(s): PROBNP in the last 8760 hours.  CBG: Recent Labs  Lab 07/11/18 1706 07/11/18 2107 07/11/18 2252 07/12/18 0737 07/12/18 1219  GLUCAP 134* 150* 138* 115* 148*    Recent Results (from the past 240 hour(s))  Urine Culture     Status: Abnormal   Collection Time: 07/07/18  5:17 PM  Result Value Ref Range Status   Specimen Description   Final    URINE, RANDOM Performed at [facility] [address] [place], [place] [address]    Special Requests   Final    NONE Performed at [facility], [place] [address], [place], [place] [address]    Culture (A)  Final    <10,000 COLONIES/mL INSIGNIFICANT GROWTH Performed at [facility] [address] [place], [place] [address]    Report Status 07/09/2018 FINAL  Final  Culture, blood (routine x 2)     Status: None   Collection Time: 07/08/18 12:19 PM  Result Value Ref Range Status   Specimen Description BLOOD LEFT ANTECUBITAL  Final   Special Requests   Final    BOTTLES DRAWN AEROBIC AND ANAEROBIC Blood Culture adequate volume   Culture   Final    NO GROWTH 5 DAYS Performed at [facility], [place] [address], [place], [place] [address]    Report Status 07/13/2018 FINAL  Final  Urine culture     Status: None   Collection Time: 07/08/18 12:33 PM  Result Value Ref Range Status    Specimen Description URINE, CATHETERIZED  Final   Special Requests NONE  Final   Culture   Final    NO GROWTH Performed at [facility], [place] [address] [place], [place] [address]    Report Status 07/09/2018 FINAL  Final  Culture, blood (routine x 2)     Status: None   Collection Time: 07/08/18 12:55 PM  Result Value Ref Range Status   Specimen Description BLOOD RIGHT WRIST  Final   Special Requests   Final    BOTTLES DRAWN AEROBIC AND ANAEROBIC Blood Culture results may not be optimal due to an inadequate volume of blood received in culture bottles   Culture   Final    NO GROWTH 5 DAYS Performed at [facility], [place] [address] [place], [place] [address]    Report Status 07/13/2018 FINAL  Final  MRSA PCR Screening     Status: None   Collection Time: 07/09/18  3:00 AM  Result Value Ref Range Status   MRSA by PCR NEGATIVE NEGATIVE Final    Comment:        The GeneXpert MRSA Assay (FDA approved for NASAL specimens only), is one component of a comprehensive MRSA colonization surveillance program. It is not intended to diagnose MRSA infection nor to guide or monitor treatment for MRSA infections. Performed at [facility], [place] [address] [place], [place] [address]   Culture, body fluid-bottle     Status: None (Preliminary result)   Collection Time: 07/15/18  2:53 PM  Result Value Ref Range Status   Specimen Description PLEURAL  Final   Special Requests FLUID  Final   Culture   Final    NO GROWTH < 24 HOURS Performed at [facility], [place] [address] [place], [place] [address]    Report Status PENDING  Incomplete  Gram stain     Status: None   Collection Time: 07/15/18  2:53 PM  Result Value Ref Range Status   Specimen Description PLEURAL  Final   Special Requests FLUID  Final   Gram Stain   Final    ABUNDANT WBC PRESENT,BOTH PMN AND MONONUCLEAR NO ORGANISMS SEEN Performed at [facility], [address] [place], [place] [address]     Report Status 07/15/2018 FINAL  Final  Studies: Dg Chest 1 View  Result Date: 07/15/2018 CLINICAL DATA:  Post thoracentesis EXAM: CHEST  1 VIEW COMPARISON:  07/14/2018, 07/13/2018, 07/08/2018 FINDINGS: Small bilateral pleural effusions, right greater than left, decreased on the right side. No definitive pneumothorax. Cardiomegaly with enlarged mediastinal silhouette, aortic atherosclerosis, and ascending aortic graft. Dense bibasilar airspace disease. Large hiatal hernia. IMPRESSION: 1. Small bilateral pleural effusions, slightly decreased on the right side. No definite right pneumothorax 2. Stable cardiomediastinal silhouette with bibasilar airspace disease Electronically Signed   By: Donavan Foil M.D.   On: 07/15/2018 16:10   Ct Chest Wo Contrast  Result Date: 07/14/2018 CLINICAL DATA:  Shock and altered mental status with unclear etiology. Two recent falls. Status post CABG status post right mastectomy for breast cancer. EXAM: CT CHEST WITHOUT CONTRAST TECHNIQUE: Multidetector CT imaging of the chest was performed following the standard protocol without IV contrast. COMPARISON:  Portable chest obtained earlier today. Chest CT dated 04/07/2016. FINDINGS: Cardiovascular: Post CABG changes and aortic valve stent are again demonstrated. Dense coronary artery and aortic calcifications. Normal sized heart. Diffuse low density of the blood relative to the arterial walls. Dense bilateral carotid artery calcifications. Mediastinum/Nodes: No significant change in a large hiatal hernia. No enlarged lymph nodes. Unremarkable thyroid gland. Lungs/Pleura: The previously demonstrated 8 x 5 mm nodular density in the left upper lobe has a less linear and more irregular appearance today, measuring 13 x 9 mm on image number 42 series 4. Stable linear scarring more anteriorly in the left upper lobe. Stable small calcified granuloma more inferiorly in the left upper lobe. Interval adjacent 4 mm and 3 mm nodular densities  in the central right upper lobe on images 39 and 37 respectively. At least a portion of the 4 mm nodular density appears represent a small focal mucous plug small or small endobronchial nodule. Small to moderate-sized right pleural effusion and small left pleural effusion. Associated bilateral lower lobe compressive atelectasis, right greater than left. There is also mild dependent atelectasis in the right upper lobe. Upper Abdomen: Extensive atheromatous arterial calcifications. Cholecystectomy clips. Musculoskeletal: Stable 75% L1 vertebral compression deformity. Interval 90% T7 and 70% T9 compression deformities with no visible acute fracture lines. Interval left transverse shear fracture through the inferior aspect of the T10 vertebral body. The inferior fragment is fused with the T11 vertebral body. There is 1.4 cm of distraction of the fragments anteriorly with acute lordosis. Acute fracture lines are visualized. The posterior elements are intact. IMPRESSION: 1. Interval acute transverse shear fracture through the inferior aspect of the T10 vertebral body with 1.4 cm of distraction of the fragments anteriorly and associated acute lordosis. The posterior elements are intact. 2. 13 x 9 mm enlarging, irregular nodule in the left upper lobe with features highly suspicious for a small primary lung carcinoma. 3. Interval adjacent 4 mm and 3 mm nodular densities in the central right upper lobe. A portion of the 4 mm nodular density is endobronchial in location. These could represent small metastases, areas of mucous plugging or additional primary malignancy. A nodular infectious process is also possible. 4. Interval small to moderate-sized right pleural effusion and small left pleural effusion. 5. Bilateral lower lobe compressive atelectasis, right greater than left. 6. Stable large hiatal hernia. 7. Post CABG changes with dense calcific coronary artery and aortic atherosclerosis. 8. Bilateral carotid artery dense  calcific atherosclerosis. Critical Value/emergent results were called by telephone at the time of interpretation on 07/14/2018 at 5:20 pm to Dr. Berle Mull , who  verbally acknowledged these results. Electronically Signed   By: Claudie Revering M.D.   On: 07/14/2018 17:53   Ir Thoracentesis Asp Pleural Space W/img Guide  Result Date: 07/15/2018 INDICATION: Shortness of breath. Right-sided pleural effusion. Request diagnostic and therapeutic thoracentesis. EXAM: ULTRASOUND GUIDED RIGHT THORACENTESIS MEDICATIONS: None. COMPLICATIONS: None immediate. Postprocedural chest x-ray negative for pneumothorax PROCEDURE: An ultrasound guided thoracentesis was thoroughly discussed with the patient and questions answered. The benefits, risks, alternatives and complications were also discussed. The patient understands and wishes to proceed with the procedure. Written consent was obtained. Ultrasound was performed to localize and mark an adequate pocket of fluid in the right chest. The area was then prepped and draped in the normal sterile fashion. 1% Lidocaine was used for local anesthesia. Under ultrasound guidance a 6 Fr Safe-T-Centesis catheter was introduced. Thoracentesis was performed. The catheter was removed and a dressing applied. FINDINGS: A total of approximately 270 mL of thin, bloody fluid was removed. Samples were sent to the laboratory as requested by the clinical team. IMPRESSION: Successful ultrasound guided right thoracentesis yielding 270 mL of pleural fluid. Read by: Ascencion Dike PA-C Electronically Signed   By: Jerilynn Mages.  Shick M.D.   On: 07/15/2018 15:47    Scheduled Meds: . atorvastatin  20 mg Oral QPM  . calcitonin (salmon)  1 spray Alternating Nares Daily  . donepezil  5 mg Oral QHS  . levothyroxine  50 mcg Oral QAC breakfast  . lidocaine  2 patch Transdermal Q24H  . pantoprazole  40 mg Oral BID AC  . warfarin  2.5 mg Oral ONCE-1800  . Warfarin - Pharmacist Dosing Inpatient   Does not apply q1800    Continuous Infusions:  Active Problems:   Goals of care, counseling/discussion   Septic shock (HCC)   Encephalopathy acute   Pressure injury of skin   Thoracic vertebral fracture (HCC)   Severe sepsis with septic shock (HCC)   Lactic acidosis   AKI (acute kidney injury) ([place])   Elevated INR   Recurrent right pleural effusion   Palliative care by specialist      Desiree Hane  [facility]

## 2018-07-16 NOTE — Progress Notes (Signed)
Spoke with PT r/t pt brace. Recommendation not to utilize brace tonight, that PT will assess and put on pt when getting oob.

## 2018-07-16 NOTE — Progress Notes (Signed)
ANTICOAGULATION CONSULT NOTE - Follow Up Consult  Pharmacy Consult for Coumadin Indication:  hx DVT, on lifelong anticoagulation  Allergies  Allergen Reactions  . Contrast Media  [Iodinated Diagnostic Agents] Itching  . Keflex [Cephalexin] Rash    Severe itching  . Latex Rash  . Neosporin [Neomycin-Bacitracin Zn-Polymyx] Rash    Patient Measurements: Height: 5' 4.02" (162.6 cm) Weight: 129 lb 13.6 oz (58.9 kg) IBW/kg (Calculated) : 54.74  Vital Signs: Temp: 98.2 F (36.8 C) (03/07 0900) Temp Source: Oral (03/07 0900) BP: 140/76 (03/07 0900) Pulse Rate: 79 (03/07 0900)  Labs: Recent Labs    07/13/18 1129  07/14/18 0230 07/14/18 0600 07/15/18 0234 07/15/18 0804 07/16/18 0243 07/16/18 0846  HGB  --    < >  --  11.0*  --  11.1*  --  10.7*  HCT  --    < >  --  34.1*  --  35.3*  --  33.9*  PLT  --    < >  --  186  --  219  --  260  LABPROT  --   --  18.9*  --  18.9*  --  19.3*  --   INR  --   --  1.6*  --  1.6*  --  1.7*  --   CREATININE 0.87  --  0.88  --  0.81  --   --   --    < > = values in this interval not displayed.    Estimated Creatinine Clearance: 43.8 mL/min (by C-G formula based on SCr of 0.81 mg/dL).   Assessment: 85 yr old female on lifelong Coumadin PTA for hx DVT x 2.  Admitted 2/28 with GI bleed and hemorrhagic shock.  INR was 5.2. Coumadin reversed with KCentra, FFP and Vitamin K. Coumadin resumed 3/3 without bridge. Plan conservative dosing for slow rise to therapeutic INR. Continues on Protonix 40 mg IV q12h.      INR 1.7, slowly rising toward goal INR 2-3,  after Coumadin 2.5 mg daily x 4 days. CBC stable.  No bleeding reported   PTA Coumadin regimen:  5 mg MWFSat, 2.5 mg TTSun.  INR 5.2 on admit 2/28.  Daughter reports that recent outpatient INR had been just below goal and 5 mg doses were increased from three to four times a week.  Goal of Therapy:  INR 2-3 Monitor platelets by anticoagulation protocol: Yes   Plan:   Coumadin 2.5 mg  today.  Daily PT/INR.   Nicole Cella, RPh Please check AMION for all [facility] phone numbers After 10:00 PM, call [facility] [place] [phone] 07/16/2018,11:04 AM

## 2018-07-16 NOTE — Progress Notes (Signed)
CSW received a phone call from [facility]. Admissions director was wanting to know when the patient would be stable. CSW stated that she would call the doctor and find out.   CSW paged the MD. Dr. Lonny Prude called back and stated that the patient could possibly be ready on Sunday or Monday. Dr. Lonny Prude also mentioned that when she was speaking with the family, they are now wanting to lean towards home health. She has put in a new PT evaluation consult.   CSW will follow up with the family once the new PT evaluation has come through.   CSW will continue to follow.   Domenic Schwab, MSW, [place]

## 2018-07-16 NOTE — Progress Notes (Signed)
Tlso brace arrived on unit by staff. Report family in room and pt resting at this time.

## 2018-07-17 DIAGNOSIS — I350 Nonrheumatic aortic (valve) stenosis: Secondary | ICD-10-CM

## 2018-07-17 DIAGNOSIS — E039 Hypothyroidism, unspecified: Secondary | ICD-10-CM

## 2018-07-17 DIAGNOSIS — M546 Pain in thoracic spine: Secondary | ICD-10-CM

## 2018-07-17 DIAGNOSIS — J9601 Acute respiratory failure with hypoxia: Secondary | ICD-10-CM

## 2018-07-17 LAB — PROTIME-INR
INR: 1.7 — ABNORMAL HIGH (ref 0.8–1.2)
Prothrombin Time: 19.7 seconds — ABNORMAL HIGH (ref 11.4–15.2)

## 2018-07-17 LAB — CBC
HCT: 33.6 % — ABNORMAL LOW (ref 36.0–46.0)
Hemoglobin: 10.7 g/dL — ABNORMAL LOW (ref 12.0–15.0)
MCH: 26.3 pg (ref 26.0–34.0)
MCHC: 31.8 g/dL (ref 30.0–36.0)
MCV: 82.6 fL (ref 80.0–100.0)
Platelets: 192 10*3/uL (ref 150–400)
RBC: 4.07 MIL/uL (ref 3.87–5.11)
RDW: 17.2 % — ABNORMAL HIGH (ref 11.5–15.5)
WBC: 12.5 10*3/uL — ABNORMAL HIGH (ref 4.0–10.5)
nRBC: 0 % (ref 0.0–0.2)

## 2018-07-17 MED ORDER — WARFARIN SODIUM 4 MG PO TABS
4.0000 mg | ORAL_TABLET | Freq: Once | ORAL | Status: AC
  Administered 2018-07-17: 4 mg via ORAL
  Filled 2018-07-17: qty 1

## 2018-07-17 MED ORDER — MORPHINE SULFATE (PF) 2 MG/ML IV SOLN
2.0000 mg | INTRAVENOUS | Status: AC
  Administered 2018-07-17: 2 mg via INTRAVENOUS
  Filled 2018-07-17: qty 1

## 2018-07-17 NOTE — Progress Notes (Signed)
  Palliative care follow-up note  Patient seen, chart reviewed.  No family at the bedside currently.  Bedside RN present.  She states she tried to get patient up to the chair with new brace on but patient was unable to tolerate secondary to severe pain  She is back in the bed now complaining of severe painr"please hurry, please hurry".  Patient has tramadol ordered every 6 hours as needed as well as Robaxin 250 mg.  She has received both of these PRN's without relief  Plan Had plan to introduce MOST form to family and complete but family not at bedside this afternoon Have paged attending to discuss unmanaged pain. Will give MS04 IV 2mg  x1 Please call ortho tech to reassess brace fit. Discussed with bedside RN Palliative medicine to continue to reach family and complete MOST form if they are agreeable  Thank you,  Romona Curls, NP Time Spent: 20 min Greater than 50% of time spent in counseling and coordination of care

## 2018-07-17 NOTE — Progress Notes (Signed)
[facility] PROGRESS NOTE  Becky Mcmahon UKG:[id number] DOB: [birth year] DOA: 07/08/2018 PCP: Prince Solian, MD  Assessment/Plan:  Acute hypoxic respiratory failure secondary to moderate-sized pleural effusions, improving.  No longer requiring oxygen at rest..  Status post thoracentesis on 3/6 with small volume removed.  Doubt infection given patient has remained afebrile, no concerning infectious etiology and pleural fluid analysis so culture data still pending as well as cytology.   BNP is slightly elevated at 298. No peripheral edema  TTE shows preserved EF, no changes at TAVR site. Had previously been on Flagyl, Zosyn and Vancomycin in early part of admission (2/28-3/2). Leukocytosis is improving, CT chest mentions small nodular area that could be infectious process among other things, interestingly she has remained afebrile off antibiotics. - Will need PT eval with brace, amatory O2 sat -  I will hold off on initiating antibiotics unless she clinically declares with fever, worsening cough/respiratory status ( not able to wean oxygen) will also await pleural fluid data for cytology and culture data.  Bilateral, exudatevepleural effusions, improved.  status post thoracentesis on 3/6.  270 cc of bloody fluid removed. Repeat CXR shows decrease in size.  Cytology and culture currently pending, elevated LDH consistent with exudative.  Some concern for malignancy given increase in size of pulmonary nodule mentioned on CT chest -Follow-up cytology/culture results of pleural fluid analysis  Increasing left upper lobe pulmonary nodule, noted on CT chest on 3/5 (increased in size from 2017).  Has some features that are concerning for potential primary lung malignancy. -Discussed with family who agree with further evaluation as outpatient.  T10 shear fracture.  Evaluated by neurosurgery who recommends mobilization and TLSO brace as family is not interested in surgery given the risk versus  benefits.  Remains neurologically intact. -Will have PT reevaluate. -Family interested in SNF, case manager aware  Shock unclear if hemorrhagic versus septic, resolved.  Presented with abdominal pain and witnessed melena as well as significant anemia.  Had significant hypothermia, tachypnea, tachycardia and profound hypotension requiring pressor support in ICU as well as a lactic acidosis greater than 11.  GI hemorrhage/acute blood loss anemia, resolved.  In setting of elevated INR 5.2 on admission.  No endoscopic interventions on admission due to profound hemodynamic instability.  Hemoglobin remained stable at 11 currently and patient tolerating being on warfarin without any active signs of bleeding.  S/p 2 units of packed red blood cells -Daily CBC  Elevated INR, resolved. s/p 2 units of fresh frozen plasma to reverse INR during hospital stay. -Daily INR, pharmacy protocol  Hx of DVT, on lifelong warfarin ( 2 episodes of DVT), continue warfarin and monitor closely for bleeding, -Check iNR daily- goal INR of 2 (not at goal yet)  Acute metabolic encephalopathy, resolved in setting of severe shock is most likely contributor.  CT head negative for any acute abnormalities.  Will avoid sedating medications. -Closely monitor  Aortic stenosis s/p TAVR (2014).  Repeat TTE on 3/6 shows preserved function.  Hypernatremia, resolved. Related to dehydration related to increased sedation. Briefly needed D5.   AKI, resolved  Hypothyroidism, stable -Continue Synthroid  Dementia, somewhat conversant and appropriate. -Home aricept. Delirium precautions   Code Status: Partial  Family Communication: husband and daughter updated at bedside  Disposition Plan: On room air, will need PT eval/walking ambulatory oxygen set surrogate determine supplemental needs, will likely need SNF, case manager aware.   Consultants:  PCCM, NSG  Procedures:  3/6  thoracentesis  Intubation  Antibiotics: Doxycycline 2/24 >  2/28 Aztreonam 2/28 Zosyn 2/28 > 3/2 Vancomycin 2/28 >3/1  HPI/Subjective:  Becky Mcmahon is a 85 y.o. year old female with medical history significant for CAD s/p CABG, TAVR 2014 [facility]) breast CA s/p R mastectomy, recurrent DVTs, HTN, HLD, mild dementia, cerebral atherosclerosis, and a compression fracture who presented on 07/08/2018 with reports of nausea and abdominal pain prompting ED visit on 2/27 before being found minimally responsive by her daughter prior to admission and was found to have profound shock unclear if hemorrhagic or septic.  No complaints Feels breathing is better Denies fevers chills, cough   Objective: Vitals:   07/17/18 0650 07/17/18 0824  BP:  (!) 142/68  Pulse:  77  Resp:  20  Temp:  98.3 F (36.8 C)  SpO2: 90% 96%    Intake/Output Summary (Last 24 hours) at 07/17/2018 1538 Last data filed at 07/17/2018 0263 Gross per 24 hour  Intake 100 ml  Output 881 ml  Net -781 ml   Filed Weights   07/12/18 0400 07/16/18 0500 07/17/18 0500  Weight: 64.6 kg 58.9 kg 60 kg    Exam:   General:  Lying in bed, no distress  Cardiovascular: RRR, no peripheral edema  Respiratory: Normal respiratory effort on room air, clear breath sounds on auscultation of anterior chest (limited by mobility patient not in brace in bed) , no wheezing or crackles  Abdomen: soft, Non-distended, non-tender, normal bowel soudns  Musculoskeletal: normal ROM   Skin no rashes or lesions  Neurologic alert and oriented x4, no appreciable focal deficits  Data Reviewed: Basic Metabolic Panel: Recent Labs  Lab 07/12/18 0343 07/13/18 0234 07/13/18 1129 07/14/18 0230 07/15/18 0234  NA 147* 145 145 146* 142  K 3.2* 2.5* 3.5 3.3* 3.9  CL 123* 117* 117* 114* 114*  CO2 16* 21* 21* 25 25  GLUCOSE 137* 140* 135* 103* 104*  BUN 28* 19 16 15 13   CREATININE 1.26* 0.98 0.87 0.88 0.81  CALCIUM 8.2* 8.0* 8.1* 7.8* 8.1*   MG  --  1.8  --  1.8  --    Liver Function Tests: Recent Labs  Lab 07/12/18 0343 07/13/18 0234 07/14/18 0230 07/15/18 0234  AST 26 55* 46* 37  ALT 26 42 46* 43  ALKPHOS 72 83 102 123  BILITOT 0.7 0.6 0.3 0.5  PROT 4.6* 4.9* 4.9* 5.1*  ALBUMIN 2.0* 2.1* 1.9* 2.0*   No results for input(s): LIPASE, AMYLASE in the last 168 hours. No results for input(s): AMMONIA in the last 168 hours. CBC: Recent Labs  Lab 07/13/18 1701 07/14/18 0600 07/15/18 0804 07/16/18 0846 07/17/18 0916  WBC 19.0* 16.9* 15.9* 14.3* 12.5*  NEUTROABS 16.7* 13.7*  --   --   --   HGB 11.2* 11.0* 11.1* 10.7* 10.7*  HCT 35.2* 34.1* 35.3* 33.9* 33.6*  MCV 82.6 83.6 83.8 82.5 82.6  PLT 156 186 219 260 192   Cardiac Enzymes: No results for input(s): CKTOTAL, CKMB, CKMBINDEX, TROPONINI in the last 168 hours. BNP (last 3 results) Recent Labs    07/08/18 1218 07/13/18 1701  BNP 122.7* 298.3*    ProBNP (last 3 results) No results for input(s): PROBNP in the last 8760 hours.  CBG: Recent Labs  Lab 07/11/18 1706 07/11/18 2107 07/11/18 2252 07/12/18 0737 07/12/18 1219  GLUCAP 134* 150* 138* 115* 148*    Recent Results (from the past 240 hour(s))  Urine Culture     Status: Abnormal   Collection Time: 07/07/18  5:17 PM  Result Value Ref  Range Status   Specimen Description   Final    URINE, RANDOM Performed at [facility], [place] [address] [place], [place] [address]    Special Requests   Final    NONE Performed at [facility], [place] [address] [place], [place] [address]    Culture (A)  Final    <10,000 COLONIES/mL INSIGNIFICANT GROWTH Performed at [place] [address], [place], [place] [address]    Report Status 07/09/2018 FINAL  Final  Culture, blood (routine x 2)     Status: None   Collection Time: 07/08/18 12:19 PM  Result Value Ref Range Status   Specimen Description BLOOD LEFT ANTECUBITAL  Final   Special Requests   Final     BOTTLES DRAWN AEROBIC AND ANAEROBIC Blood Culture adequate volume   Culture   Final    NO GROWTH 5 DAYS Performed at [facility], [place] [address], [place], [place] [address]    Report Status 07/13/2018 FINAL  Final  Urine culture     Status: None   Collection Time: 07/08/18 12:33 PM  Result Value Ref Range Status   Specimen Description URINE, CATHETERIZED  Final   Special Requests NONE  Final   Culture   Final    NO GROWTH Performed at [facility], [place] [address] [place], [place] [address]    Report Status 07/09/2018 FINAL  Final  Culture, blood (routine x 2)     Status: None   Collection Time: 07/08/18 12:55 PM  Result Value Ref Range Status   Specimen Description BLOOD RIGHT WRIST  Final   Special Requests   Final    BOTTLES DRAWN AEROBIC AND ANAEROBIC Blood Culture results may not be optimal due to an inadequate volume of blood received in culture bottles   Culture   Final    NO GROWTH 5 DAYS Performed at [facility], [place] [address], [place], [place] [address]    Report Status 07/13/2018 FINAL  Final  MRSA PCR Screening     Status: None   Collection Time: 07/09/18  3:00 AM  Result Value Ref Range Status   MRSA by PCR NEGATIVE NEGATIVE Final    Comment:        The GeneXpert MRSA Assay (FDA approved for NASAL specimens only), is one component of a comprehensive MRSA colonization surveillance program. It is not intended to diagnose MRSA infection nor to guide or monitor treatment for MRSA infections. Performed at [facility], [place] [address], [place], [place] [address]   Culture, body fluid-bottle     Status: None (Preliminary result)   Collection Time: 07/15/18  2:53 PM  Result Value Ref Range Status   Specimen Description PLEURAL  Final   Special Requests FLUID  Final   Culture   Final    NO GROWTH 2 DAYS Performed at [facility] [address] [place], [place] [address]    Report Status PENDING  Incomplete  Gram stain      Status: None   Collection Time: 07/15/18  2:53 PM  Result Value Ref Range Status   Specimen Description PLEURAL  Final   Special Requests FLUID  Final   Gram Stain   Final    ABUNDANT WBC PRESENT,BOTH PMN AND MONONUCLEAR NO ORGANISMS SEEN Performed at [facility], [address], [place], [place] [address]    Report Status 07/15/2018 FINAL  Final  Studies: No results found.  Scheduled Meds: . atorvastatin  20 mg Oral QPM  . calcitonin (salmon)  1 spray Alternating Nares Daily  . donepezil  5 mg Oral QHS  . levothyroxine  50 mcg Oral QAC breakfast  . lidocaine  2 patch Transdermal Q24H  . pantoprazole  40 mg Oral BID AC  . warfarin  4 mg Oral ONCE-1800  . Warfarin - Pharmacist Dosing Inpatient   Does not apply q1800   Continuous Infusions:  Active Problems:   AS (aortic stenosis)   DVT (deep venous thrombosis) (HCC)   Goals of care, counseling/discussion   Hypothyroidism, unspecified   Septic shock (HCC)   Encephalopathy acute   Pressure injury of skin   Thoracic vertebral fracture (HCC)   Severe sepsis with septic shock (HCC)   Lactic acidosis   AKI (acute kidney injury) ([place])   Elevated INR   Recurrent right pleural effusion   Palliative care by specialist   Acute respiratory failure with hypoxia ([place])      [facility]

## 2018-07-17 NOTE — Progress Notes (Signed)
Spoke with PT for an approximate status update on visit with pt. Informed by PT that they would resend a notification out again for pt assessment d/t pt possible d/c today.

## 2018-07-17 NOTE — Progress Notes (Signed)
CSW spoke with the patient's daughter and husband at bedside. The patient was asleep. CSW introduced herself and explained her role. CSW shared that when speaking with Dr. Lonny Prude the family may not want to pursue skilled nursing any longer. Pt daughter informed CSW that PT has brought by a new brace for her mother due to having a new fracture.   Pt daughter stated that PT was going to come by and complete another evaluation. CSW asked if family would like to proceed with SNF if that is still the recommendation, she agreed.   CSW will continue to follow.   Domenic Schwab, MSW, [place]

## 2018-07-17 NOTE — Progress Notes (Signed)
Physical Therapy Treatment Patient Details Name: Becky Mcmahon MRN: [id number] DOB: 28-May-1930 Today's Date: 07/17/2018    History of Present Illness 85 y.o. female admitted on 07/08/18 from home where she was found to be minimally responsive.  She was hypotensive and anemic in the ED.  Pt found to have hemorrhagic vs septic shock, GI hemorrhage, ABLA s/p 2 units PRBC, and acute metabolic encepholopathy.  Pt with other significant PMH of fall with thoracic compression fx, Per Neurosurgery, TLSO to mobilize;  DM2, CABG, HTN, DVT, CAD, R Breast CA s/p mastectomy, R hip arthroplasty, and aortic valve replacement.      PT Comments    Continuing work on functional mobility and activity tolerance;  Worked with Becky Mcmahon in her aspen brace today -- it was difficult getting a good fit donning the brace rolling in bed (and while managing pt hygeine after a BM); She is so painful with all mobility, including rolling; Assisted her to chair with +2 assist; noted able to stand and take a few steps, so added an ambulation goal;   At this point, Becky Mcmahon is not moving well enough to go home -- PT continues to recommend sNF for post-acute rehabilitation to maximize independence and safety with mobility and ADLs prior to dc home;   Session conducted on Room Air and o2 sats remained greater than or equal to 91%  Neurosurgery: please clarify whether to don brace supine rolling or can she don the brace in sitting.  Follow Up Recommendations  SNF     Equipment Recommendations  Rolling walker with 5" wheels;3in1 (PT);Wheelchair (measurements PT);Wheelchair cushion (measurements PT);Hospital bed;Other (comment)(ambulance transport, if for home)    Recommendations for Other Services       Precautions / Restrictions Precautions Precautions: Fall;Back Precaution Comments: at risk for skin breakdown Required Braces or Orthoses: Spinal Brace Spinal Brace: Thoracolumbosacral orthotic;Other  (comment)(did not specify to don rolling or sitting) Spinal Brace Comments: Opted to proceed with donning the brace rolling as most conservative approach unless otherwise ordered    Mobility  Bed Mobility Overal bed mobility: Needs Assistance Bed Mobility: Rolling;Sidelying to Sit Rolling: Max assist;Total assist;+2 for physical assistance Sidelying to sit: +2 for physical assistance;Max assist       General bed mobility comments: performed log rolls R and L to assist pt with hygeine (she had a loose BM), and to place TLSO for donning; very anxious with all movement; max assist to elevate trunk to sit and staedy at initial sit  Transfers Overall transfer level: Needs assistance Equipment used: 2 person hand held assist Transfers: Sit to/from Stand Sit to Stand: +2 safety/equipment;Mod assist         General transfer comment: Bilateral support at gait belt; Light mod assist to power up to stand; cues for fully upright posture in standing; Bil UE support as well  Ambulation/Gait Ambulation/Gait assistance: Min assist;+2 physical assistance Gait Distance (Feet): (pivotal steps bed to chair) Assistive device: 2 person hand held assist Gait Pattern/deviations: Shuffle     General Gait Details: Fatigued quickly   Chief Strategy Officer    Modified Rankin (Stroke Patients Only)       Balance     Sitting balance-Leahy Scale: Poor Sitting balance - Comments: Posterior lean     Standing balance-Leahy Scale: Zero  Cognition Arousal/Alertness: Awake/alert Behavior During Therapy: Anxious Overall Cognitive Status: Impaired/Different from baseline Area of Impairment: Attention                               General Comments: Very internally distracted by pain and/or the anticipation of pain      Exercises      General Comments General comments (skin integrity, edema, etc.): Session  conducted on Room Air, and O2 sats remained greater tahn or equal to 91%; Becky Mcmahon was in a lot of pain, and had a lot of difficulty rolling to don her brace, resulting in a suboptimal fit      Pertinent Vitals/Pain Pain Assessment: Faces Faces Pain Scale: Hurts whole lot Pain Location: back Pain Descriptors / Indicators: Aching;Discomfort;Crying Pain Intervention(s): Monitored during session;Limited activity within patient's tolerance    Home Living Family/patient expects to be discharged to:: Private residence Living Arrangements: Spouse/significant other;Children(husband and daughter.  ) Available Help at Discharge: Family;Available 24 hours/day Type of Home: House Home Access: Stairs to enter Entrance Stairs-Rails: Right;Can reach both;Left Home Layout: One level        Prior Function            PT Goals (current goals can now be found in the care plan section) Acute Rehab PT Goals PT Goal Formulation: With patient/family Time For Goal Achievement: 07/25/18(Added ambulation goal this session) Potential to Achieve Goals: Fair Progress towards PT goals: Progressing toward goals(very slowly)    Frequency    Min 2X/week      PT Plan Current plan remains appropriate    Co-evaluation              AM-PAC PT "6 Clicks" Mobility   Outcome Measure  Help needed turning from your back to your side while in a flat bed without using bedrails?: Total Help needed moving from lying on your back to sitting on the side of a flat bed without using bedrails?: A Lot Help needed moving to and from a bed to a chair (including a wheelchair)?: A Lot Help needed standing up from a chair using your arms (e.g., wheelchair or bedside chair)?: A Lot Help needed to walk in hospital room?: A Lot Help needed climbing 3-5 steps with a railing? : Total 6 Click Score: 10    End of Session Equipment Utilized During Treatment: Back brace;Gait belt Activity Tolerance: Patient limited  by pain Patient left: in chair;with call bell/phone within reach;with nursing/sitter in room;Other (comment)(Not sure)   PT Visit Diagnosis: Muscle weakness (generalized) (M62.81);Difficulty in walking, not elsewhere classified (R26.2);Pain Pain - Right/Left: (middle) Pain - part of body: (upper back)     Time: 1455-1536 PT Time Calculation (min) (ACUTE ONLY): 41 min  Charges:  $Therapeutic Activity: 38-52 mins                     Roney Marion, PT  Acute Rehabilitation Services Pager [phone] Office [phone]    Colletta [place] 07/17/2018, 6:00 PM

## 2018-07-17 NOTE — Progress Notes (Signed)
Pt was unable to tolerate sitting up in chair with any staffing as well as PT interventions. Within 2 minutes pt was in pain and slid herself down the chair. Staff was required +3 assistance to assist pt back into bed. Palliative NP came in and assisted with evaluation for pain management.

## 2018-07-17 NOTE — Progress Notes (Signed)
ANTICOAGULATION CONSULT NOTE - Follow Up Consult  Pharmacy Consult for Coumadin Indication: h/o DVT  Allergies  Allergen Reactions  . Contrast Media  [Iodinated Diagnostic Agents] Itching  . Keflex [Cephalexin] Rash    Severe itching  . Latex Rash  . Neosporin [Neomycin-Bacitracin Zn-Polymyx] Rash    Patient Measurements: Height: 5' 4.02" (162.6 cm) Weight: 132 lb 4.4 oz (60 kg) IBW/kg (Calculated) : 54.74  Vital Signs: Temp: 98.3 F (36.8 C) (03/08 0824) Temp Source: Oral (03/08 0334) BP: 142/68 (03/08 0824) Pulse Rate: 77 (03/08 0824)  Labs: Recent Labs    07/15/18 0234  07/15/18 0804 07/16/18 0243 07/16/18 0846 07/17/18 0406 07/17/18 0916  HGB  --    < > 11.1*  --  10.7*  --  10.7*  HCT  --   --  35.3*  --  33.9*  --  33.6*  PLT  --   --  219  --  260  --  192  LABPROT 18.9*  --   --  19.3*  --  19.7*  --   INR 1.6*  --   --  1.7*  --  1.7*  --   CREATININE 0.81  --   --   --   --   --   --    < > = values in this interval not displayed.    Estimated Creatinine Clearance: 43.8 mL/min (by C-G formula based on SCr of 0.81 mg/dL).   Assessment:   Anticoag:  warfarin PTA for hx DVT held/reversed on admit 2/28 for GIB (INR was 5.2). Got KCentra, FFP and Vit K 10 mg IV on 2/28. No evidence of ongoing bleeding per notes, but was on lifelong Coumadin d/t hx DVT, resumed on 3/3. INR 1.7 again today.  - 3/5 Minor nosebleed; RN humidified O2>  no evidence of ongoing bleeding.   - PTA Coumadin: 5 mg MWFSat, 2.5 mg TTSun. INR 5.2 on admit 2/28   - dtr reported recent INR 1.9 and regimen increased to 5 mg 4 days instead of 3 days a week.   Goal of Therapy:  INR 2-3 Monitor platelets by anticoagulation protocol: Yes   Plan:   Coumadin 4 mg po x 1  Daily PT/INR  No bridge. Go slowly and watch for any bleeding  Zebastian Carico S. Alford Highland, PharmD, BCPS Clinical Staff Pharmacist Eilene Ghazi [place] 07/17/2018,11:03 AM

## 2018-07-18 LAB — PROTIME-INR
INR: 1.5 — AB (ref 0.8–1.2)
Prothrombin Time: 17.9 seconds — ABNORMAL HIGH (ref 11.4–15.2)

## 2018-07-18 MED ORDER — WARFARIN SODIUM 5 MG PO TABS
5.0000 mg | ORAL_TABLET | Freq: Once | ORAL | Status: AC
  Administered 2018-07-18: 5 mg via ORAL
  Filled 2018-07-18: qty 1

## 2018-07-18 MED ORDER — ACETAMINOPHEN 325 MG PO TABS: 650.0000 mg | ORAL_TABLET | Freq: Four times a day (QID) | ORAL | Status: DC | PRN

## 2018-07-18 MED ORDER — ACETAMINOPHEN 325 MG PO TABS: 650.0000 mg | ORAL_TABLET | Freq: Three times a day (TID) | ORAL | Status: DC

## 2018-07-18 MED ORDER — ACETAMINOPHEN 325 MG PO TABS
650.0000 mg | ORAL_TABLET | Freq: Three times a day (TID) | ORAL | Status: DC
  Administered 2018-07-18 – 2018-07-20 (×5): 650 mg via ORAL
  Filled 2018-07-18 (×5): qty 2

## 2018-07-18 NOTE — Care Management Important Message (Signed)
Important Message  Patient Details  Name: Becky Mcmahon MRN: [id number] Date of Birth: 09-22-1930   Medicare Important Message Given:  Yes    [name] 07/18/2018, 3:52 PM

## 2018-07-18 NOTE — Progress Notes (Signed)
Daily Progress Note   Patient Name: Becky Mcmahon       Date: 07/18/2018 DOB: 03-17-1931  Age: 85 y.o. MRN#: [id number] Attending Physician: Desiree Hane, MD Primary Care Physician: Prince Solian, MD Admit Date: 07/08/2018  Reason for Consultation/Follow-up: Establishing goals of care  Subjective: Patient in bed, moaning in pain. Daughter and spouse at bedside. Reviewed GOC. They are continuing to hope for d/c to SNF. However, awaiting results of pathology from thoracentesis as this information may change their GOC.   ROS  Length of Stay: 10  Current Medications: Scheduled Meds:  . acetaminophen  650 mg Oral Q8H  . atorvastatin  20 mg Oral QPM  . calcitonin (salmon)  1 spray Alternating Nares Daily  . donepezil  5 mg Oral QHS  . levothyroxine  50 mcg Oral QAC breakfast  . lidocaine  2 patch Transdermal Q24H  . pantoprazole  40 mg Oral BID AC  . warfarin  5 mg Oral ONCE-1800  . Warfarin - Pharmacist Dosing Inpatient   Does not apply q1800    Continuous Infusions:   PRN Meds: LORazepam, methocarbamol, traMADol  Physical Exam          Vital Signs: BP (!) 147/61   Pulse 81   Temp 98 F (36.7 C) (Oral)   Resp 18   Ht 5' 4.02" (1.626 m)   Wt 59.1 kg   SpO2 91%   BMI 22.35 kg/m  SpO2: SpO2: 91 % O2 Device: O2 Device: Nasal Cannula O2 Flow Rate: O2 Flow Rate (L/min): 2 L/min  Intake/output summary:   Intake/Output Summary (Last 24 hours) at 07/18/2018 1221 Last data filed at 07/18/2018 2841 Gross per 24 hour  Intake 400 ml  Output 500 ml  Net -100 ml   LBM: Last BM Date: 07/16/18 Baseline Weight: Weight: 65 kg Most recent weight: Weight: 59.1 kg       Palliative Assessment/Data:    Flowsheet Rows     Most Recent Value  Intake Tab  Referral  Department  Hospitalist  Unit at Time of Referral  Med/Surg Unit  Palliative Care Primary Diagnosis  Pulmonary  Date Notified  07/14/18  Palliative Care Type  New Palliative care  Reason for referral  Clarify Goals of Care, Counsel Regarding Hospice  Date of Admission  08/06/18  Date first  seen by Palliative Care  07/15/18  # of days Palliative referral response time  1 Day(s)  # of days IP prior to Palliative referral  -23  Clinical Assessment  Palliative Performance Scale Score  40%  Pain Max last 24 hours  Not able to report  Pain Min Last 24 hours  Not able to report  Dyspnea Max Last 24 Hours  Not able to report  Dyspnea Min Last 24 hours  Not able to report  Nausea Max Last 24 Hours  Not able to report  Nausea Min Last 24 Hours  Not able to report  Anxiety Max Last 24 Hours  Not able to report  Anxiety Min Last 24 Hours  Not able to report  Other Max Last 24 Hours  Not able to report  Psychosocial & Spiritual Assessment  Palliative Care Outcomes  Patient/Family meeting held?  Yes  Who was at the meeting?  pt, spouse, dtr  Palliative Care Outcomes  Provided psychosocial or spiritual support  Patient/Family wishes: Interventions discontinued/not started   [place]  Palliative Care follow-up planned  Yes, Facility      Patient Active Problem List   Diagnosis Date Noted  . Acute bilateral thoracic back pain   . Acute respiratory failure with hypoxia ([place]) 07/16/2018  . Thoracic vertebral fracture ([place]) 07/15/2018  . Severe sepsis with septic shock ([place]) 07/15/2018  . Lactic acidosis 07/15/2018  . AKI (acute kidney injury) ([place]) 07/15/2018  . Elevated INR 07/15/2018  . Recurrent right pleural effusion 07/15/2018  . Palliative care by specialist   . Septic shock ([place]) 07/08/2018  . Pressure injury of skin 07/08/2018  . Encephalopathy acute   . 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 Goals of care, counseling/discussion 02/17/2013  . Breast  cancer ([place]) 08/08/2012  . GERD (gastroesophageal reflux disease) 08/08/2012  . Hypothyroidism, unspecified 08/08/2012  . Osteoporosis 08/08/2012  . Aortic valve disorders 05/27/2012  . 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[place]) 05/27/2012  . Arthritis 05/27/2012  . HTN (hypertension) 05/27/2012  . Hyperlipidemia, unspecified 05/27/2012  . DVT (deep venous thrombosis) ([place])   . AS (aortic stenosis) 05/24/2012  . CAD (coronary artery disease) 05/24/2012  . S/P CABG x 5 10/25/1995    Palliative Care Assessment & Plan   Patient Profile: 85 y.o. female  with past medical history of aortic stenosis status post TAVR 2014, acute on chronic congestive heart failure, coronary artery disease status post CABG, history of breast cancer status post right mastectomy, history of DVT, severe osteoporosis admitted on 07/08/2018 with severe back pain, GI bleed and septic shock.  CTC revealed new, acute shear fracture at T10.  Patient also has a known compression fracture at L1.  CT scan of the chest revealed bilateral pleural effusions.  Patient has had a left upper lobe nodule that has been monitored since 2017.  In 2017 it was 8 mm x 5 mm.  CT of the chest performed on 07/14/2018 shows nodule now 13 mm x 9 mm.  Consult ordered for goals of care.   Assessment/Recommendations/Plan   PMT will follow up with family once pathology for thoracentesis has been received  Will start scheduled acetaminophen 650mg  q8hrs for increased pain control  Goals of Care and Additional Recommendations:  Limitations on Scope of Treatment: Full Scope Treatment  Code Status:  Limited code  Prognosis:   Unable to determine  Discharge Planning:  To Be Determined  Care plan was discussed with patient's family.  Thank you for allowing  the Palliative Medicine Team to assist in the care of this patient.   Time In: 0930 Time Out: 1005 Total Time 35 minutes Prolonged Time Billed no      Greater than 50%  of this time was spent  counseling and coordinating care related to the above assessment and plan.  Mariana Kaufman, AGNP-C Palliative Medicine   Please contact Palliative Medicine Team phone at [phone] for questions and concerns.

## 2018-07-18 NOTE — Progress Notes (Signed)
ANTICOAGULATION CONSULT NOTE - Follow Up Consult  Pharmacy Consult for Coumadin Indication:  hx DVT, on lifelong anticoagulation  Allergies  Allergen Reactions  . Contrast Media  [Iodinated Diagnostic Agents] Itching  . Keflex [Cephalexin] Rash    Severe itching  . Latex Rash  . Neosporin [Neomycin-Bacitracin Zn-Polymyx] Rash    Patient Measurements: Height: 5' 4.02" (162.6 cm) Weight: 130 lb 4.7 oz (59.1 kg) IBW/kg (Calculated) : 54.74  Vital Signs: Temp: 98 F (36.7 C) (03/09 0852) Temp Source: Oral (03/09 0852) BP: 147/61 (03/09 0852) Pulse Rate: 81 (03/09 0852)  Labs: Recent Labs    07/16/18 0243 07/16/18 0846 07/17/18 0406 07/17/18 0916 07/18/18 0249  HGB  --  10.7*  --  10.7*  --   HCT  --  33.9*  --  33.6*  --   PLT  --  260  --  192  --   LABPROT 19.3*  --  19.7*  --  17.9*  INR 1.7*  --  1.7*  --  1.5*    Estimated Creatinine Clearance: 43.8 mL/min (by C-G formula based on SCr of 0.81 mg/dL).   Assessment:   85 yr old female on lifelong Coumadin PTA for hx DVT x 2.  Admitted 2/28 with GI bleed and hemorrhagic shock.  INR was 5.2. Coumadin reversed with KCentra, FFP and Vitamin K. No evidence of ongoing bleeding. Coumadin to resumed 3/3 without bridge. Plan conservative dosing for slow rise to therapeutic INR. Continues on Protonix 40 mg PO BID.    INR down to 1.4 today.  Had risen slowly to 1.7 on Coumadin 2.5 mg x 5 days, then leveled off.  Dose increased to 4 mg on 3/8 but INR trended down today.     PTA Coumadin regimen:  5 mg MWFSat, 2.5 mg TTSun.  INR 5.2 on admit 2/28.  Daughter reported recent outpatient INR had been just below goal and 5 mg doses were increased from three to four times a week.  Goal of Therapy:  INR 2-3 Monitor platelets by anticoagulation protocol: Yes   Plan:   Coumadin 5 mg x 1 today.  Daily PT/INR.  Arty Baumgartner, [place] Pager: [phone] or phone: [phone] 07/18/2018,10:45 AM

## 2018-07-18 NOTE — Progress Notes (Signed)
[facility] PROGRESS NOTE  Becky Mcmahon MLY:[id number] DOB: [birth year] DOA: 07/08/2018 PCP: Becky Solian, MD  Assessment/Plan:  Acute hypoxic respiratory failure secondary to moderate-sized pleural effusions, improving.  No longer requiring oxygen at rest..  Status post thoracentesis on 3/6 with small volume removed.  Doubt infection given patient has remained afebrile, no concerning infectious etiology and pleural fluid analysis so culture data still pending as well as cytology.   BNP is slightly elevated at 298. No peripheral edema  TTE shows preserved EF, no changes at TAVR site. Had previously been on Flagyl, Zosyn and Vancomycin in early part of admission (2/28-3/2). Leukocytosis is improving, CT chest mentions small nodular area that could be infectious process among other things, interestingly she has remained afebrile off antibiotics. - passed o2 screen, has appropriate brace, PT recs SNF, awaiting bed   Bilateral, exudatevepleural effusions, improved.  status post thoracentesis on 3/6.  270 cc of bloody fluid removed. Repeat CXR shows decrease in size.  Cytology  currently pending, elevated LDH consistent with exudative.  Some concern for malignancy given increase in size of pulmonary nodule mentioned on CT chest. Culture analysis negative -Follow-up cytology/culture results of pleural fluid analysis  Increasing left upper lobe pulmonary nodule, noted on CT chest on 3/5 (increased in size from 2017).  Has some features that are concerning for potential primary lung malignancy. -Discussed with family who agree with further evaluation as outpatient.  T10 shear fracture.  Evaluated by neurosurgery who recommends mobilization and TLSO brace as family is not interested in surgery given the risk versus benefits.  Remains neurologically intact. -Family interested in SNF, case manager aware  Shock unclear if hemorrhagic versus septic, resolved.  Presented with abdominal pain and  witnessed melena as well as significant anemia.  Had significant hypothermia, tachypnea, tachycardia and profound hypotension requiring pressor support in ICU as well as a lactic acidosis greater than 11.  GI hemorrhage/acute blood loss anemia, resolved.  In setting of elevated INR 5.2 on admission.  No endoscopic interventions on admission due to profound hemodynamic instability.  Hemoglobin remained stable at 11 currently and patient tolerating being on warfarin without any active signs of bleeding.  S/p 2 units of packed red blood cells -Daily CBC  Elevated INR, resolved. s/p 2 units of fresh frozen plasma to reverse INR during Mcmahon stay. -Daily INR, pharmacy protocol  Hx of DVT, on lifelong warfarin ( 2 episodes of DVT), continue warfarin and monitor closely for bleeding, -Check iNR daily- goal INR of 2 (not at goal yet)  Acute metabolic encephalopathy, resolved in setting of severe shock is most likely contributor.  CT head negative for any acute abnormalities.  Will avoid sedating medications. -Closely monitor  Aortic stenosis s/p TAVR (2014).  Repeat TTE on 3/6 shows preserved function.  Hypernatremia, resolved. Related to dehydration related to increased sedation. Briefly needed D5.   AKI, resolved  Hypothyroidism, stable -Continue Synthroid  Dementia, somewhat conversant and appropriate. -Home aricept. Delirium precautions   Code Status: Partial  Family Communication: husband and daughter updated at bedside  Disposition Plan: On room air, no O2 needs need SNF, S/w aware, medically stable for dc   Consultants:  PCCM, NSG  Procedures:  3/6 thoracentesis  Intubation  Antibiotics: Doxycycline 2/24 > 2/28 Aztreonam 2/28 Zosyn 2/28 > 3/2 Vancomycin 2/28 >3/1  HPI/Subjective:  Becky Mcmahon is a 85 y.o. year old female with medical history significant for CAD s/p CABG, TAVR 2014 Becky Mcmahon) breast CA s/p R mastectomy, recurrent DVTs, HTN,  HLD, mild dementia,  cerebral atherosclerosis, and a compression fracture who presented on 07/08/2018 with reports of nausea and abdominal pain prompting ED visit on 2/27 before being found minimally responsive by her daughter prior to admission and was found to have profound shock unclear if hemorrhagic or septic.  No issues No CP, fevers or chills, no cough Still pain with movement  Objective: Vitals:   07/18/18 1931 07/18/18 1941  BP:  (!) 164/76  Pulse:  (!) 101  Resp: (!) 29 20  Temp:  (!) 97.5 F (36.4 C)  SpO2:  92%    Intake/Output Summary (Last 24 hours) at 07/18/2018 2102 Last data filed at 07/18/2018 6440 Gross per 24 hour  Intake 200 ml  Output 100 ml  Net 100 ml   Filed Weights   07/16/18 0500 07/17/18 0500 07/18/18 0500  Weight: 58.9 kg 60 kg 59.1 kg    Exam:   General:  Lying in bed, no distress  Cardiovascular: RRR, no peripheral edema  Respiratory: Normal respiratory effort on room air, clear breath sounds on auscultation of anterior chest (limited by mobility patient not in brace in bed) , no wheezing or crackles  Abdomen: soft, Non-distended, non-tender, normal bowel soudns  Musculoskeletal: normal ROM   Skin no rashes or lesions  Neurologic alert and oriented x4, no appreciable focal deficits  Data Reviewed: Basic Metabolic Panel: Recent Labs  Lab 07/12/18 0343 07/13/18 0234 07/13/18 1129 07/14/18 0230 07/15/18 0234  NA 147* 145 145 146* 142  K 3.2* 2.5* 3.5 3.3* 3.9  CL 123* 117* 117* 114* 114*  CO2 16* 21* 21* 25 25  GLUCOSE 137* 140* 135* 103* 104*  BUN 28* 19 16 15 13   CREATININE 1.26* 0.98 0.87 0.88 0.81  CALCIUM 8.2* 8.0* 8.1* 7.8* 8.1*  MG  --  1.8  --  1.8  --    Liver Function Tests: Recent Labs  Lab 07/12/18 0343 07/13/18 0234 07/14/18 0230 07/15/18 0234  AST 26 55* 46* 37  ALT 26 42 46* 43  ALKPHOS 72 83 102 123  BILITOT 0.7 0.6 0.3 0.5  PROT 4.6* 4.9* 4.9* 5.1*  ALBUMIN 2.0* 2.1* 1.9* 2.0*   No results for input(s): LIPASE, AMYLASE  in the last 168 hours. No results for input(s): AMMONIA in the last 168 hours. CBC: Recent Labs  Lab 07/13/18 1701 07/14/18 0600 07/15/18 0804 07/16/18 0846 07/17/18 0916  WBC 19.0* 16.9* 15.9* 14.3* 12.5*  NEUTROABS 16.7* 13.7*  --   --   --   HGB 11.2* 11.0* 11.1* 10.7* 10.7*  HCT 35.2* 34.1* 35.3* 33.9* 33.6*  MCV 82.6 83.6 83.8 82.5 82.6  PLT 156 186 219 260 192   Cardiac Enzymes: No results for input(s): CKTOTAL, CKMB, CKMBINDEX, TROPONINI in the last 168 hours. BNP (last 3 results) Recent Labs    07/08/18 1218 07/13/18 1701  BNP 122.7* 298.3*    ProBNP (last 3 results) No results for input(s): PROBNP in the last 8760 hours.  CBG: Recent Labs  Lab 07/11/18 2107 07/11/18 2252 07/12/18 0737 07/12/18 1219  GLUCAP 150* 138* 115* 148*    Recent Results (from the past 240 hour(s))  MRSA PCR Screening     Status: None   Collection Time: 07/09/18  3:00 AM  Result Value Ref Range Status   MRSA by PCR NEGATIVE NEGATIVE Final    Comment:        The GeneXpert MRSA Assay (FDA approved for NASAL specimens only), is one component of a comprehensive MRSA  colonization surveillance program. It is not intended to diagnose MRSA infection nor to guide or monitor treatment for MRSA infections. Performed at [facility], [place] [address] [place], [place] [address]   Culture, body fluid-bottle     Status: None (Preliminary result)   Collection Time: 07/15/18  2:53 PM  Result Value Ref Range Status   Specimen Description PLEURAL  Final   Special Requests FLUID  Final   Culture   Final    NO GROWTH 3 DAYS Performed at [facility] [address], [place], [place] [address]    Report Status PENDING  Incomplete  Gram stain     Status: None   Collection Time: 07/15/18  2:53 PM  Result Value Ref Range Status   Specimen Description PLEURAL  Final   Special Requests FLUID  Final   Gram Stain   Final    ABUNDANT WBC PRESENT,BOTH PMN AND MONONUCLEAR NO  ORGANISMS SEEN Performed at [facility], [address], [place],  [address]    Report Status 07/15/2018 FINAL  Final     Studies: No results found.  Scheduled Meds: . atorvastatin  20 mg Oral QPM  . calcitonin (salmon)  1 spray Alternating Nares Daily  . donepezil  5 mg Oral QHS  . levothyroxine  50 mcg Oral QAC breakfast  . lidocaine  2 patch Transdermal Q24H  . pantoprazole  40 mg Oral BID AC  . Warfarin - Pharmacist Dosing Inpatient   Does not apply q1800   Continuous Infusions:  Active Problems:   AS (aortic stenosis)   DVT (deep venous thrombosis) ([facility])   Goals of care, counseling/discussion   Hypothyroidism, unspecified   Septic shock ([facility])   Encephalopathy acute   Pressure injury of skin   Thoracic vertebral fracture ([facility])   Severe sepsis with septic shock ([facility])   Lactic acidosis   AKI (acute kidney injury) ([place])   Elevated INR   Recurrent right pleural effusion   Palliative care by specialist   Acute respiratory failure with hypoxia ([place])   Acute bilateral thoracic back pain      Becky Mcmahon  [facility]

## 2018-07-19 DIAGNOSIS — R578 Other shock: Secondary | ICD-10-CM

## 2018-07-19 DIAGNOSIS — S22078A Other fracture of T9-T10 vertebra, initial encounter for closed fracture: Secondary | ICD-10-CM

## 2018-07-19 DIAGNOSIS — Z7189 Other specified counseling: Secondary | ICD-10-CM

## 2018-07-19 DIAGNOSIS — J90 Pleural effusion, not elsewhere classified: Secondary | ICD-10-CM

## 2018-07-19 LAB — PROTIME-INR
INR: 1.7 — ABNORMAL HIGH (ref 0.8–1.2)
Prothrombin Time: 19.6 seconds — ABNORMAL HIGH (ref 11.4–15.2)

## 2018-07-19 MED ORDER — CETIRIZINE HCL 10 MG PO TABS: 10.0000 mg | ORAL_TABLET | Freq: Every day | ORAL | Status: AC

## 2018-07-19 MED ORDER — FLUTICASONE PROPIONATE 50 MCG/ACT NA SUSP: 1.0000 | Freq: Every day | NASAL | 2 refills | Status: AC

## 2018-07-19 MED ORDER — WARFARIN SODIUM 5 MG PO TABS: ORAL_TABLET | ORAL | 1 refills | Status: AC

## 2018-07-19 MED ORDER — WARFARIN SODIUM 5 MG PO TABS
5.0000 mg | ORAL_TABLET | Freq: Once | ORAL | Status: AC
  Administered 2018-07-19: 5 mg via ORAL
  Filled 2018-07-19: qty 1

## 2018-07-19 MED ORDER — VITAMIN C 1000 MG PO TABS: 1000.0000 mg | ORAL_TABLET | Freq: Every day | ORAL | Status: AC

## 2018-07-19 MED ORDER — MORPHINE SULFATE (PF) 2 MG/ML IV SOLN
1.0000 mg | Freq: Once | INTRAVENOUS | Status: AC
  Administered 2018-07-19: 1 mg via INTRAVENOUS
  Filled 2018-07-19: qty 1

## 2018-07-19 MED ORDER — LIDOCAINE 5 % EX PTCH: 2.0000 | MEDICATED_PATCH | CUTANEOUS | 0 refills | Status: AC

## 2018-07-19 MED ORDER — PANTOPRAZOLE SODIUM 40 MG PO TBEC: 40.0000 mg | DELAYED_RELEASE_TABLET | Freq: Two times a day (BID) | ORAL | Status: AC

## 2018-07-19 MED ORDER — CALCITONIN (SALMON) 200 UNIT/ACT NA SOLN: 1.0000 | Freq: Every day | NASAL | 0 refills | Status: AC

## 2018-07-19 MED ORDER — TIZANIDINE HCL 4 MG PO CAPS: 4.0000 mg | ORAL_CAPSULE | Freq: Three times a day (TID) | ORAL | 1 refills | Status: AC | PRN

## 2018-07-19 MED ORDER — DONEPEZIL HCL 5 MG PO TABS: 5.0000 mg | ORAL_TABLET | Freq: Every day | ORAL | Status: AC

## 2018-07-19 MED ORDER — TRAMADOL HCL 50 MG PO TABS: 50.0000 mg | ORAL_TABLET | Freq: Four times a day (QID) | ORAL | 0 refills | Status: AC | PRN

## 2018-07-19 MED ORDER — VITAMIN D 50 MCG (2000 UT) PO TABS: 2000.0000 [IU] | ORAL_TABLET | Freq: Every evening | ORAL | Status: AC

## 2018-07-19 MED ORDER — LORAZEPAM 0.5 MG PO TABS: 0.2500 mg | ORAL_TABLET | Freq: Two times a day (BID) | ORAL | 0 refills | Status: AC | PRN

## 2018-07-19 MED ORDER — ATORVASTATIN CALCIUM 20 MG PO TABS: 20.0000 mg | ORAL_TABLET | Freq: Every evening | ORAL | Status: AC

## 2018-07-19 MED ORDER — LISINOPRIL 10 MG PO TABS: 10.0000 mg | ORAL_TABLET | Freq: Every day | ORAL | Status: AC

## 2018-07-19 MED ORDER — LEVOTHYROXINE SODIUM 50 MCG PO TABS: 50.0000 ug | ORAL_TABLET | Freq: Every day | ORAL | Status: AC

## 2018-07-19 NOTE — Discharge Summary (Signed)
Discharge Summary  Becky Mcmahon ZSW:[id number] DOB: January 30, 1931  PCP: Prince Solian, MD  Admit date: 07/08/2018 Discharge date: 07/19/2018   Time spent: < 25 minutes  Admitted From: home Disposition:  SNF  Recommendations for Outpatient Follow-up:  1. Follow up with PCP in 1 week, check BMP (creatinine), CBC (hemoglobin), INR, goal 2-3) Suggest 5 mg Coumadin Monday, Wednesday, Friday and 2.5 mg Tuesday, Thursday, Saturday, Sunday at discharge 2. Recommend outpatient palliative referral at facility 3. PRN pain control with robaxin, tramadol\ 4. Therapist at facility to donn brace at bed level with rolling due to kyphosis    Discharge Diagnoses:  Active Hospital Problems   Diagnosis Date Noted  . Pleural effusion on right   . Advanced care planning/counseling discussion   . Acute bilateral thoracic back pain   . Acute respiratory failure with hypoxia ([place]) 07/16/2018  . Thoracic vertebral fracture ([place]) 07/15/2018  . Severe sepsis with septic shock ([place]) 07/15/2018  . Lactic acidosis 07/15/2018  . AKI (acute kidney injury) ([place]) 07/15/2018  . Elevated INR 07/15/2018  . Recurrent right pleural effusion 07/15/2018  . Palliative care by specialist   . Septic shock ([place]) 07/08/2018  . Pressure injury of skin 07/08/2018  . Encephalopathy acute   . Goals of care, counseling/discussion 02/17/2013  . Hypothyroidism, unspecified 08/08/2012  . DVT (deep venous thrombosis) ([place])   . AS (aortic stenosis) 05/24/2012    Resolved Hospital Problems  No resolved problems to display.    Discharge Condition: STABLE   CODE STATUS: FULL CODE History of present illness:  Becky Mcmahon is a 85 y.o. year old female with medical history significant for CAD s/p CABG,TAVR 2014 ([facility])breast CA s/p R mastectomy,recurrentDVTs on lifelong warfarin, HTN, HLD, mild dementia, cerebral atherosclerosis, R great toe nail plate infection (removed on 2/24) and started on doxycycline  for cellulitis and a compression fracture who presented on 07/08/2018 with reports of nausea and abdominal pain prompting ED visit on 2/27 before being found minimally responsive by her daughter prior to admission and was found to have profound shock unclear if hemorrhagic or septic. Remaining hospital course addressed in problem based format below:   Hospital Course:   Acute hypoxic respiratory failure secondary to moderate-sized pleural effusions, improving.  No longer requiring oxygen at rest.  Status post thoracentesis on 3/6 with small volume removed.  Doubt infection given patient has remained afebrile, no concerning infectious etiology and pleural fluid analysis negative culture and Gram stain.   BNP is slightly elevated at 298. No peripheral edema  TTE shows preserved EF, no changes at TAVR site. Had previously been on Flagyl, Zosyn and Vancomycin in early part of admission (2/28-3/2).  -passed o2 screen now on room air, has appropriate brace, PT recs SNF, awaiting bed  Bilateral, exudatevepleural effusions, improved.  status post thoracentesis on 3/6.  270 cc of bloody fluid removed. Repeat CXR shows decrease in size.  Cytology  currently pending, elevated LDH consistent with exudative.  Some concern for malignancy given increase in size of pulmonary nodule mentioned on CT chest. Culture analysis negative.  Cytology negative for malignant cells.  No longer requiring oxygen  Increasing left upper lobe pulmonary nodule, noted on CT chest on 3/5 (increased in size from 2017).  Has some features that are concerning for potential primary lung malignancy.  Pleural fluid analysis negative for malignant cells -Discussed with family who agree with further evaluation as outpatient. -Outpatient follow-up with repeat chest imaging to monitor nodule  T10 shear fracture.  Evaluated by neurosurgery who recommends mobilization and TLSO brace as family is not interested in surgery given the risk versus  benefits.  Remains neurologically intact. -Spinal Brace ( tolerating rolling to don TLSO, and donn in sitting but limited due to kyphosis). Therapist at facility to donn brace at bed level with rolling due to kyphosis provided on discharge, to use with exertion - Calcitonin nasal spray for 2 weeks for pain control -PRN Zanaflex for muscle spasm, tramadol as needed every 6 hours for pain, lidocaine patch  Shock unclear if hemorrhagic versus septic, resolved.  Presented with abdominal pain and witnessed melena as well as significant anemia.  Had significant hypothermia, tachypnea, tachycardia and profound hypotension requiring pressor support in ICU as well as a lactic acidosis greater than 11.  On vancomycin and Zosyn for 48 hours however blood cultures remain unremarkable,No localizing signs or symptoms of infection, chest x-ray/CT chest negative for infection.  Pleural fluid analysis of effusion negative for infection on Gram stain and culture  GI hemorrhage/acute blood loss anemia, resolved.  In setting of elevated INR 5.2 on admission.    GI did not recommend endoscopic interventions on admission due to profound hemodynamic instability initially.  Hemoglobin remained stable at 11 after receiving 4 PRBC blood transfusions earlier in admission currently and patient tolerating being on warfarin without any active signs of bleeding.   -Closely monitor INR as mentioned below  Elevated INR, resolved. s/p 2 units of fresh frozen plasma to reverse INR during hospital stay. -Daily INR, pharmacy protocol  Hx of DVT, on lifelong warfarin ( 2 episodes of DVT), INR 1.7 on discharge.  Given Coumadin 5 mg x 1 on 3/10.  Tolerating reinitiation of Coumadin without any bleeding -Suggest 5 mg Coumadin Monday, Wednesday, Friday and 2.5 mg Tuesday, Thursday, Saturday, Sunday at discharge -Will need to monitor INR at SNF, goal INR 2-3   Acute metabolic encephalopathy, resolved in setting of severe shock as most  likely contributor.  CT head negative for any acute abnormalities.   =avoid sedating medications. -Closely monitor  Aortic stenosis s/p TAVR (2014).   Repeat TTE on 3/6 shows preserved valvefunction.  Hypernatremia, resolved.  Related to dehydration related to increased sedation. Briefly needed D5. Doing well now   AKI, resolved.  Likely prerenal etiology related to hypotension from shock physiology.  Held lisinopril throughout hospital stay.  Creatinine returned to baseline (0.8-0.9) -Can resume home lisinopril repeat BMP on follow-up  Hypothyroidism, stable -Continue Synthroid  Dementia, conversant and appropriate. -Home aricept.  Goals of care Family completed MOST form placed in hart. Elects for Full Code. Palliative medicine assisted management of pain/symptom control and goals of care discussion during admission - Recommend outpatient palliative referral at facility   Consultations:  PCCM, GI, NSG, Palliative  Procedures/Studies: 3/6 thoracentesis 3/6 TTE  The left ventricle has normal systolic function with an ejection fraction of 60-65%. The cavity size was normal. Left ventricular diastolic parameters were normal.  2. The right ventricle has normal systolic function. The cavity was normal. There is no increase in right ventricular wall thickness.  3. The mitral valve is degenerative. Mild thickening of the mitral valve leaflet. Mild calcification of the mitral valve leaflet. There is mild mitral annular calcification present.  4. The tricuspid valve is normal in structure.  5. Aortic valve regurgitation is trivial by color flow Doppler.  6. Post TAVR with 29 mm Core Valve trivial peri valvular regurgitation.  7. The pulmonic valve was grossly normal. Pulmonic valve regurgitation is  mild by color flow Doppler.  Discharge Exam: BP 133/66 (BP Location: Left Arm)   Pulse 86   Temp 98.3 F (36.8 C) (Oral)   Resp 14   Ht 5' 4.02" (1.626 m)   Wt 59.1 kg   SpO2  94%   BMI 22.35 kg/m   General: frail elderly female lying in bed, no apparent distress Eyes: EOMI, anicteric ENT: Oral Mucosa clear and moist Cardiovascular: regular rate and rhythm, no murmurs, rubs or gallops, no edema, Respiratory: Normal respiratory effort on room air, lungs clear to auscultation bilaterally on anterior chest ( limited due to poor mobility in bed without brace) Abdomen: soft, non-distended, non-tender, normal bowel sounds Skin: No Rash Neurologic: Grossly no focal neuro deficit.Mental status AAOx3, speech normal, Psychiatric:Appropriate affect, and mood   Discharge Instructions You were cared for by a hospitalist during your hospital stay. If you have any questions about your discharge medications or the care you received while you were in the hospital after you are discharged, you can call the unit and asked to speak with the hospitalist on call if the hospitalist that took care of you is not available. Once you are discharged, your primary care physician will handle any further medical issues. Please note that NO REFILLS for any discharge medications will be authorized once you are discharged, as it is imperative that you return to your primary care physician (or establish a relationship with a primary care physician if you do not have one) for your aftercare needs so that they can reassess your need for medications and monitor your lab values.  Discharge Instructions    Diet - low sodium heart healthy   Complete by:  As directed    Increase activity slowly   Complete by:  As directed      Allergies as of 07/19/2018      Reactions   Contrast Media  [iodinated Diagnostic Agents] Itching   Keflex [cephalexin] Rash   Severe itching   Latex Rash   Neosporin [neomycin-bacitracin Zn-polymyx] Rash      Medication List    STOP taking these medications   aspirin EC 81 MG tablet   doxycycline 100 MG tablet Commonly known as:  VIBRA-TABS   ibuprofen 200 MG  tablet Commonly known as:  ADVIL,MOTRIN   mirtazapine 7.5 MG tablet Commonly known as:  REMERON   Multi-Vitamins Tabs   MULTIVITAMIN PO   nystatin powder Generic drug:  nystatin     TAKE these medications   atorvastatin 20 MG tablet Commonly known as:  LIPITOR Take 1 tablet (20 mg total) by mouth every evening.   calcitonin (salmon) 200 UNIT/ACT nasal spray Commonly known as:  MIACALCIN/FORTICAL Place 1 spray into alternate nostrils daily for 14 days. Start taking on:  July 20, 2018   cetirizine 10 MG tablet Commonly known as:  ZYRTEC Take 1 tablet (10 mg total) by mouth daily.   donepezil 5 MG tablet Commonly known as:  ARICEPT Take 1 tablet (5 mg total) by mouth at bedtime.   fluticasone 50 MCG/ACT nasal spray Commonly known as:  FLONASE Place 1 spray into both nostrils daily.   levothyroxine 50 MCG tablet Commonly known as:  SYNTHROID, LEVOTHROID Take 1 tablet (50 mcg total) by mouth daily before breakfast.   lidocaine 5 % Commonly known as:  LIDODERM Place 2 patches onto the skin daily. Remove & Discard patch within 12 hours or as directed by MD Start taking on:  July 20, 2018   lisinopril 10  MG tablet Commonly known as:  PRINIVIL,ZESTRIL Take 1 tablet (10 mg total) by mouth daily.   LORazepam 0.5 MG tablet Commonly known as:  ATIVAN Take 0.5 tablets (0.25 mg total) by mouth 2 (two) times daily as needed for anxiety. Takes at lunchtime and bedtime. What changed:    how much to take  when to take this  reasons to take this   pantoprazole 40 MG tablet Commonly known as:  PROTONIX Take 1 tablet (40 mg total) by mouth 2 (two) times daily before a meal.   tiZANidine 4 MG capsule Commonly known as:  ZANAFLEX Take 1 capsule (4 mg total) by mouth 3 (three) times daily as needed for muscle spasms.   traMADol 50 MG tablet Commonly known as:  ULTRAM Take 1 tablet (50 mg total) by mouth every 6 (six) hours as needed for up to 7 days for severe pain.    vitamin C 1000 MG tablet Take 1 tablet (1,000 mg total) by mouth daily.   Vitamin D 50 MCG (2000 UT) tablet Take 1 tablet (2,000 Units total) by mouth every evening.   warfarin 5 MG tablet Commonly known as:  COUMADIN 5 mg Mon, Wed, Fri 2.5 mg tues, thurs, Sat, Sun What changed:    how much to take  how to take this  when to take this  additional instructions      Allergies  Allergen Reactions  . Contrast Media  [Iodinated Diagnostic Agents] Itching  . Keflex [Cephalexin] Rash    Severe itching  . Latex Rash  . Neosporin [Neomycin-Bacitracin Zn-Polymyx] Rash   Contact information for after-discharge care    Destination    [facility]erred SN[facility] .   Service:  Skilled Nursing Contact information: [place] [phone]               The results of significant diagnostics from this hospitalization (including imaging, microbiology, ancillary and laboratory) are listed below for reference.    Significant Diagnostic Studies: Ct Abdomen Pelvis Wo Contrast  Result Date: 07/07/2018 CLINICAL DATA:  Flank pain EXAM: CT ABDOMEN AND PELVIS WITHOUT CONTRAST TECHNIQUE: Multidetector CT imaging of the abdomen and pelvis was performed following the standard protocol without IV contrast. COMPARISON:  CT abdomen pelvis 01/28/2018 FINDINGS: LOWER CHEST: Large right and small left pleural effusions. Large hiatal hernia. There is a proximal aortic stent. HEPATOBILIARY: The hepatic contours and density are normal. There is no intra- or extrahepatic biliary dilatation. Status post cholecystectomy. PANCREAS: The pancreatic parenchymal contours are normal and there is no ductal dilatation. There is no peripancreatic fluid collection. SPLEEN: Normal. ADRENALS/URINARY TRACT: --Adrenal glands: Normal. --Right kidney/ureter: No hydronephrosis, nephroureterolithiasis, perinephric stranding or solid renal mass. --Left  kidney/ureter: There is a lower pole cyst measuring 2.2 cm. No hydronephrosis, nephroureterolithiasis, perinephric stranding or solid renal mass. --Urinary bladder: Normal for degree of distention STOMACH/BOWEL: --Stomach/Duodenum: Large hiatal hernia. Normal duodenal course. --Small bowel: No dilatation or inflammation. --Colon: No focal abnormality. --Appendix: Not visualized. No right lower quadrant inflammation or free fluid. VASCULAR/LYMPHATIC: There is aortic atherosclerosis without hemodynamically significant stenosis. No abdominal or pelvic lymphadenopathy. REPRODUCTIVE: No free fluid in the pelvis. No adnexal mass. MUSCULOSKELETAL. Postsurgical changes of the right bony pelvis. Multilevel degenerative disc disease and facet arthrosis with chronic compression fracture of L1. There is also a chronic compression deformity of T9. OTHER: None. IMPRESSION: 1. No acute abdominal or pelvic abnormality. 2. Large hiatal hernia and large right, small left pleural effusions. Aortic  Atherosclerosis (ICD10-I70.0). Electronically Signed   By: Ulyses Jarred M.D.   On: 07/07/2018 15:35   Dg Chest 1 View  Result Date: 07/15/2018 CLINICAL DATA:  Post thoracentesis EXAM: CHEST  1 VIEW COMPARISON:  07/14/2018, 07/13/2018, 07/08/2018 FINDINGS: Small bilateral pleural effusions, right greater than left, decreased on the right side. No definitive pneumothorax. Cardiomegaly with enlarged mediastinal silhouette, aortic atherosclerosis, and ascending aortic graft. Dense bibasilar airspace disease. Large hiatal hernia. IMPRESSION: 1. Small bilateral pleural effusions, slightly decreased on the right side. No definite right pneumothorax 2. Stable cardiomediastinal silhouette with bibasilar airspace disease Electronically Signed   By: Donavan Foil M.D.   On: 07/15/2018 16:10   Dg Chest 2 View  Result Date: 07/07/2018 CLINICAL DATA:  Back pain and leukocytosis EXAM: CHEST - 2 VIEW COMPARISON:  12/27/2017 chest radiograph  FINDINGS: Prior median sternotomy with CABG and aortic endograft. There is blunting of the costophrenic angles, likely pleural effusion or chronic pleural scarring. Unchanged multilevel thoracic compression deformities. No focal airspace consolidation. IMPRESSION: No active cardiopulmonary disease. Bilateral pleural scarring or small effusion. Electronically Signed   By: Ulyses Jarred M.D.   On: 07/07/2018 18:01   Ct Head Wo Contrast  Result Date: 07/08/2018 CLINICAL DATA:  Altered level of consciousness. EXAM: CT HEAD WITHOUT CONTRAST TECHNIQUE: Contiguous axial images were obtained from the base of the skull through the vertex without intravenous contrast. COMPARISON:  04/07/2016 FINDINGS: Brain: There is no evidence of acute infarct, intracranial hemorrhage, mass, midline shift, or extra-axial fluid collection. Cerebral atrophy is unchanged. Cerebral white matter hypodensities are similar to the prior study and nonspecific but compatible with mild chronic small vessel ischemic disease. Vascular: Calcified atherosclerosis at the skull base. No hyperdense vessel. Skull: No fracture or focal osseous lesion. Sinuses/Orbits: Unchanged right sphenoid sinus mucous retention cyst. Clear mastoid air cells. Bilateral cataract extraction. Other: Small metallic object in the left external auditory canal, possibly a portion of a hearing aid. IMPRESSION: 1. No evidence of acute intracranial abnormality. 2. Mild chronic small vessel ischemic disease. Electronically Signed   By: Logan Bores M.D.   On: 07/08/2018 13:11   Ct Chest Wo Contrast  Result Date: 07/14/2018 CLINICAL DATA:  Shock and altered mental status with unclear etiology. Two recent falls. Status post CABG status post right mastectomy for breast cancer. EXAM: CT CHEST WITHOUT CONTRAST TECHNIQUE: Multidetector CT imaging of the chest was performed following the standard protocol without IV contrast. COMPARISON:  Portable chest obtained earlier today. Chest CT  dated 04/07/2016. FINDINGS: Cardiovascular: Post CABG changes and aortic valve stent are again demonstrated. Dense coronary artery and aortic calcifications. Normal sized heart. Diffuse low density of the blood relative to the arterial walls. Dense bilateral carotid artery calcifications. Mediastinum/Nodes: No significant change in a large hiatal hernia. No enlarged lymph nodes. Unremarkable thyroid gland. Lungs/Pleura: The previously demonstrated 8 x 5 mm nodular density in the left upper lobe has a less linear and more irregular appearance today, measuring 13 x 9 mm on image number 42 series 4. Stable linear scarring more anteriorly in the left upper lobe. Stable small calcified granuloma more inferiorly in the left upper lobe. Interval adjacent 4 mm and 3 mm nodular densities in the central right upper lobe on images 39 and 37 respectively. At least a portion of the 4 mm nodular density appears represent a small focal mucous plug small or small endobronchial nodule. Small to moderate-sized right pleural effusion and small left pleural effusion. Associated bilateral lower lobe compressive atelectasis, right  greater than left. There is also mild dependent atelectasis in the right upper lobe. Upper Abdomen: Extensive atheromatous arterial calcifications. Cholecystectomy clips. Musculoskeletal: Stable 75% L1 vertebral compression deformity. Interval 90% T7 and 70% T9 compression deformities with no visible acute fracture lines. Interval left transverse shear fracture through the inferior aspect of the T10 vertebral body. The inferior fragment is fused with the T11 vertebral body. There is 1.4 cm of distraction of the fragments anteriorly with acute lordosis. Acute fracture lines are visualized. The posterior elements are intact. IMPRESSION: 1. Interval acute transverse shear fracture through the inferior aspect of the T10 vertebral body with 1.4 cm of distraction of the fragments anteriorly and associated acute  lordosis. The posterior elements are intact. 2. 13 x 9 mm enlarging, irregular nodule in the left upper lobe with features highly suspicious for a small primary lung carcinoma. 3. Interval adjacent 4 mm and 3 mm nodular densities in the central right upper lobe. A portion of the 4 mm nodular density is endobronchial in location. These could represent small metastases, areas of mucous plugging or additional primary malignancy. A nodular infectious process is also possible. 4. Interval small to moderate-sized right pleural effusion and small left pleural effusion. 5. Bilateral lower lobe compressive atelectasis, right greater than left. 6. Stable large hiatal hernia. 7. Post CABG changes with dense calcific coronary artery and aortic atherosclerosis. 8. Bilateral carotid artery dense calcific atherosclerosis. Critical Value/emergent results were called by telephone at the time of interpretation on 07/14/2018 at 5:20 pm to Dr. Berle Mull , who verbally acknowledged these results. Electronically Signed   By: Claudie Revering M.D.   On: 07/14/2018 17:53   Dg Chest Port 1 View  Result Date: 07/14/2018 CLINICAL DATA:  Chest tube present.  Right pleural effusion. EXAM: PORTABLE CHEST 1 VIEW COMPARISON:  07/13/2018 FINDINGS: Stable cardiomegaly. Stable bilateral pleural effusions and volume loss in the lungs. Large hiatal hernia is noted. No pneumothorax. Normal vascularity. Postoperative changes. IMPRESSION: Stable bilateral pleural effusions. Electronically Signed   By: Marybelle Killings M.D.   On: 07/14/2018 10:07   Dg Chest Port 1 View  Result Date: 07/13/2018 CLINICAL DATA:  85 year old female with sudden onset of shortness of breath today. EXAM: PORTABLE CHEST 1 VIEW COMPARISON:  Chest x-ray 07/08/2018. FINDINGS: Lung volumes are low. Extensive bibasilar opacities (right greater than left) appears similar to the prior study and may reflect areas of atelectasis and/or consolidation. Moderate right and small left pleural  effusions. Large hiatal hernia. No definite pulmonary edema. Heart size count be evaluated secondary to patient positioning which distorts mediastinal contours. Aortic atherosclerosis. Postprocedural changes of prior TAVR procedure with CoreValve in position. Status post median sternotomy for CABG including LIMA. IMPRESSION: 1. Overall, the appearance the chest appears very similar to the prior examination, with moderate right and small left pleural effusions as well as bibasilar opacities which may reflect areas of atelectasis and/or consolidation. 2. Aortic atherosclerosis. Electronically Signed   By: Vinnie Langton M.D.   On: 07/13/2018 13:13   Dg Chest Portable 1 View  Result Date: 07/08/2018 CLINICAL DATA:  Hypoxia EXAM: PORTABLE CHEST 1 VIEW COMPARISON:  07/07/2018 FINDINGS: Postsurgical changes are again seen and stable. Small bilateral pleural effusions are again identified and stable given some variation in the technical factors of the film. No focal infiltrate is seen. No acute bony abnormality is noted. IMPRESSION: Stable blunting of the costophrenic angles. No new focal abnormality is noted. Electronically Signed   By: Linus Mako.D.  On: 07/08/2018 12:41   Dg Foot Complete Left  Result Date: 07/04/2018 Please see detailed radiograph report in office note.  Dg Foot Complete Right  Result Date: 07/08/2018 CLINICAL DATA:  Right great toe discoloration and laceration. Infection/sepsis. EXAM: RIGHT FOOT COMPLETE - 3+ VIEW COMPARISON:  07/04/2018 FINDINGS: No acute fracture or dislocation is identified. The bones are diffusely osteopenic. There is great toe swelling without evidence of underlying osseous erosion or soft tissue emphysema. Marginal osteophyte formation is most notable at the DIP joints of the second and third toes. A small plantar calcaneal enthesophyte is noted. There are extensive atherosclerotic vascular calcifications. IMPRESSION: Soft tissue swelling without evidence of  acute osseous abnormality. Electronically Signed   By: Logan Bores M.D.   On: 07/08/2018 13:48   Dg Foot Complete Right  Result Date: 07/04/2018 Please see detailed radiograph report in office note.  Ir Thoracentesis Asp Pleural Space W/img Guide  Result Date: 07/15/2018 INDICATION: Shortness of breath. Right-sided pleural effusion. Request diagnostic and therapeutic thoracentesis. EXAM: ULTRASOUND GUIDED RIGHT THORACENTESIS MEDICATIONS: None. COMPLICATIONS: None immediate. Postprocedural chest x-ray negative for pneumothorax PROCEDURE: An ultrasound guided thoracentesis was thoroughly discussed with the patient and questions answered. The benefits, risks, alternatives and complications were also discussed. The patient understands and wishes to proceed with the procedure. Written consent was obtained. Ultrasound was performed to localize and mark an adequate pocket of fluid in the right chest. The area was then prepped and draped in the normal sterile fashion. 1% Lidocaine was used for local anesthesia. Under ultrasound guidance a 6 Fr Safe-T-Centesis catheter was introduced. Thoracentesis was performed. The catheter was removed and a dressing applied. FINDINGS: A total of approximately 270 mL of thin, bloody fluid was removed. Samples were sent to the laboratory as requested by the clinical team. IMPRESSION: Successful ultrasound guided right thoracentesis yielding 270 mL of pleural fluid. Read by: Ascencion Dike PA-C Electronically Signed   By: Jerilynn Mages.  Shick M.D.   On: 07/15/2018 15:47    Microbiology: Recent Results (from the past 240 hour(s))  Culture, body fluid-bottle     Status: None (Preliminary result)   Collection Time: 07/15/18  2:53 PM  Result Value Ref Range Status   Specimen Description PLEURAL  Final   Special Requests FLUID  Final   Culture   Final    NO GROWTH 4 DAYS Performed at [facility], [address], [place], [place] [address]    Report Status PENDING  Incomplete    Gram stain     Status: None   Collection Time: 07/15/18  2:53 PM  Result Value Ref Range Status   Specimen Description PLEURAL  Final   Special Requests FLUID  Final   Gram Stain   Final    ABUNDANT WBC PRESENT,BOTH PMN AND MONONUCLEAR NO ORGANISMS SEEN Performed at [facility], [address], [place], [place] [address]    Report Status 07/15/2018 FINAL  Final     Labs: Basic Metabolic Panel: Recent Labs  Lab 07/13/18 0234 07/13/18 1129 07/14/18 0230 07/15/18 0234  NA 145 145 146* 142  K 2.5* 3.5 3.3* 3.9  CL 117* 117* 114* 114*  CO2 21* 21* 25 25  GLUCOSE 140* 135* 103* 104*  BUN 19 16 15 13   CREATININE 0.98 0.87 0.88 0.81  CALCIUM 8.0* 8.1* 7.8* 8.1*  MG 1.8  --  1.8  --    Liver Function Tests: Recent Labs  Lab 07/13/18 0234 07/14/18 0230 07/15/18 0234  AST 55*  46* 37  ALT 42 46* 43  ALKPHOS 83 102 123  BILITOT 0.6 0.3 0.5  PROT 4.9* 4.9* 5.1*  ALBUMIN 2.1* 1.9* 2.0*   No results for input(s): LIPASE, AMYLASE in the last 168 hours. No results for input(s): AMMONIA in the last 168 hours. CBC: Recent Labs  Lab 07/13/18 1701 07/14/18 0600 07/15/18 0804 07/16/18 0846 07/17/18 0916  WBC 19.0* 16.9* 15.9* 14.3* 12.5*  NEUTROABS 16.7* 13.7*  --   --   --   HGB 11.2* 11.0* 11.1* 10.7* 10.7*  HCT 35.2* 34.1* 35.3* 33.9* 33.6*  MCV 82.6 83.6 83.8 82.5 82.6  PLT 156 186 219 260 192   Cardiac Enzymes: No results for input(s): CKTOTAL, CKMB, CKMBINDEX, TROPONINI in the last 168 hours. BNP: BNP (last 3 results) Recent Labs    07/08/18 1218 07/13/18 1701  BNP 122.7* 298.3*    ProBNP (last 3 results) No results for input(s): PROBNP in the last 8760 hours.  CBG: No results for input(s): GLUCAP in the last 168 hours.     Signed:  Desiree Hane, MD [facility] 07/19/2018, 6:03 PM

## 2018-07-19 NOTE — Progress Notes (Signed)
Physical Therapy Treatment Patient Details Name: Becky Mcmahon MRN: [id number] DOB: August 19, 1930 Today's Date: 07/19/2018    History of Present Illness 85 y.o. female admitted on 07/08/18 from home where she was found to be minimally responsive.  She was hypotensive and anemic in the ED.  Pt found to have hemorrhagic vs septic shock, GI hemorrhage, ABLA s/p 2 units PRBC, and acute metabolic encepholopathy.  Pt with other significant PMH of fall with thoracic compression fx, Per Neurosurgery, TLSO to mobilize;  DM2, CABG, HTN, DVT, CAD, R Breast CA s/p mastectomy, R hip arthroplasty, and aortic valve replacement.  (Simultaneous filing. User may not have seen previous data.)    PT Comments    Patient seen again at request of RN in preparation for discharge. Pain and anxiety better controlled this visit, tolerating rolling and sitting. Donned TSLO with total A from therapists. Patient plans to d/c to SNF today, feel this is appropriate.   Follow Up Recommendations  SNF     Equipment Recommendations  Rolling walker with 5" wheels;3in1 (PT);Wheelchair (measurements PT);Wheelchair cushion (measurements PT);Hospital bed;Other (comment)    Recommendations for Other Services       Precautions / Restrictions Precautions Precautions: Fall;Back(Simultaneous filing. User may not have seen previous data.) Precaution Comments: at risk for skin breakdown Required Braces or Orthoses: Spinal Brace Spinal Brace: Thoracolumbosacral orthotic;Other (comment) Spinal Brace Comments: Opted to proceed with donning the brace rolling as most conservative approach unless otherwise ordered Restrictions Weight Bearing Restrictions: No    Mobility  Bed Mobility Overal bed mobility: Needs Assistance Bed Mobility: Rolling;Sidelying to Sit Rolling: Max assist;Total assist;+2 for physical assistance Sidelying to sit: +2 for physical assistance;Max assist Supine to sit: Max assist;+2 for physical  assistance;Total assist Sit to supine: HOB elevated;Max assist   General bed mobility comments: pain and anxiety more controlled this visit, tolerating rolling to don TSLO. came to stiting EOB,   Transfers                    Ambulation/Gait                 Stairs             Wheelchair Mobility    Modified Rankin (Stroke Patients Only)       Balance Overall balance assessment: Needs assistance Sitting-balance support: Feet supported;Bilateral upper extremity supported Sitting balance-Leahy Scale: Poor Sitting balance - Comments: Posterior lean     Standing balance-Leahy Scale: Zero                              Cognition Arousal/Alertness: Awake/alert Behavior During Therapy: Anxious Overall Cognitive Status: Impaired/Different from baseline Area of Impairment: Attention                                      Exercises      General Comments        Pertinent Vitals/Pain Pain Assessment: Faces Faces Pain Scale: Hurts even more Pain Location: back Pain Descriptors / Indicators: Aching;Discomfort;Crying Pain Intervention(s): Limited activity within patient's tolerance;Premedicated before session;Monitored during session;Repositioned    Home Living                      Prior Function            PT Goals (current goals can now be found  in the care plan section) Acute Rehab PT Goals Patient Stated Goal: to decrease back pain and get strong enough to walk so she can go home again.  PT Goal Formulation: With patient/family Time For Goal Achievement: 07/25/18 Potential to Achieve Goals: Fair Progress towards PT goals: Progressing toward goals    Frequency    Min 2X/week      PT Plan Current plan remains appropriate    Co-evaluation              AM-PAC PT "6 Clicks" Mobility   Outcome Measure  Help needed turning from your back to your side while in a flat bed without using bedrails?:  Total Help needed moving from lying on your back to sitting on the side of a flat bed without using bedrails?: A Lot Help needed moving to and from a bed to a chair (including a wheelchair)?: A Lot Help needed standing up from a chair using your arms (e.g., wheelchair or bedside chair)?: A Lot Help needed to walk in hospital room?: A Lot Help needed climbing 3-5 steps with a railing? : Total 6 Click Score: 10    End of Session Equipment Utilized During Treatment: Back brace;Gait belt Activity Tolerance: Patient limited by pain Patient left: in bed;with bed alarm set Nurse Communication: Mobility status PT Visit Diagnosis: Muscle weakness (generalized) (M62.81);Difficulty in walking, not elsewhere classified (R26.2);Pain     Time: 1700-1730 PT Time Calculation (min) (ACUTE ONLY): 30 min  Charges:  $Therapeutic Activity: 8-22 mins                     Reinaldo Berber, PT, DPT Acute Rehabilitation Services Pager: [phone] Office: [phone]     Reinaldo Berber 07/19/2018, 5:35 PM

## 2018-07-19 NOTE — Progress Notes (Addendum)
Daily Progress Note   Patient Name: Becky Mcmahon       Date: 07/19/2018 DOB: Jun 08, 1930  Age: 85 y.o. MRN#: [id number] Attending Physician: Becky Hane, MD Primary Care Physician: Becky Solian, MD Admit Date: 07/08/2018  Reason for Consultation/Follow-up: Establishing goals of care  Subjective: Patient in bed, awaiting placement of brace. Daughter and spouse at bedside. Family relieved that pathology did not show cancer in pleural effusion. We discussed the fact that with this fracture and discharge to SNF - due to patient's immobility- there remains risk that patient will continue to decline and may not regain functional status that she had prior to this admission. Family states that patient has had to wear this brace before and their hope is she participates in therapy and returns home after rehab. Attempted to discuss possible outcomes that could occur with advanced age and immobility including readmissions for pneumonia and attempted to discuss what patient's decisions might be in the future based on her functional status and continued pain. Family very focused on patient "getting better and going back to church".  MOST form was completed and placed on chart. After thorough discussion family elected full code and full scope care. Patient's daughterLenna Mcmahon, however, stated that she would call her sister and discuss these decisions as she believe some of them would be changed.   ROS  Length of Stay: 11  Current Medications: Scheduled Meds:  . acetaminophen  650 mg Oral Q8H  . atorvastatin  20 mg Oral QPM  . calcitonin (salmon)  1 spray Alternating Nares Daily  . donepezil  5 mg Oral QHS  . levothyroxine  50 mcg Oral QAC breakfast  . lidocaine  2 patch Transdermal Q24H  .  pantoprazole  40 mg Oral BID AC  . warfarin  5 mg Oral ONCE-1800  . Warfarin - Pharmacist Dosing Inpatient   Does not apply q1800    Continuous Infusions:   PRN Meds: LORazepam, methocarbamol, traMADol  Physical Exam          Vital Signs: BP (!) 150/67   Pulse 81   Temp 97.8 F (36.6 C) (Oral)   Resp (!) 22   Ht 5' 4.02" (1.626 m)   Wt 59.1 kg   SpO2 94%   BMI 22.35 kg/m  SpO2: SpO2: 94 % O2 Device:  O2 Device: Room Air O2 Flow Rate: O2 Flow Rate (L/min): 2 L/min  Intake/output summary:   Intake/Output Summary (Last 24 hours) at 07/19/2018 1454 Last data filed at 07/18/2018 2338 Gross per 24 hour  Intake -  Output 650 ml  Net -650 ml   LBM: Last BM Date: 07/16/18 Baseline Weight: Weight: 65 kg Most recent weight: Weight: 59.1 kg       Palliative Assessment/Data: PPS: 20%    Flowsheet Rows     Most Recent Value  Intake Tab  Referral Department  Hospitalist  Unit at Time of Referral  Med/Surg Unit  Palliative Care Primary Diagnosis  Pulmonary  Date Notified  07/14/18  Palliative Care Type  New Palliative care  Reason for referral  Clarify Goals of Care, Counsel Regarding Hospice  Date of Admission  08/06/18  Date first seen by Palliative Care  07/15/18  # of days Palliative referral response time  1 Day(s)  # of days IP prior to Palliative referral  -23  Clinical Assessment  Palliative Performance Scale Score  40%  Pain Max last 24 hours  Not able to report  Pain Min Last 24 hours  Not able to report  Dyspnea Max Last 24 Hours  Not able to report  Dyspnea Min Last 24 hours  Not able to report  Nausea Max Last 24 Hours  Not able to report  Nausea Min Last 24 Hours  Not able to report  Anxiety Max Last 24 Hours  Not able to report  Anxiety Min Last 24 Hours  Not able to report  Other Max Last 24 Hours  Not able to report  Psychosocial & Spiritual Assessment  Palliative Care Outcomes  Patient/Family meeting held?  Yes  Who was at the meeting?  pt,  spouse, dtr  Palliative Care Outcomes  Provided psychosocial or spiritual support  Patient/Family wishes: Interventions discontinued/not started   [place]  Palliative Care follow-up planned  Yes, Facility      Patient Active Problem List   Diagnosis Date Noted  . Acute bilateral thoracic back pain   . Acute respiratory failure with hypoxia ([place]) 07/16/2018  . Thoracic vertebral fracture ([place]) 07/15/2018  . Severe sepsis with septic shock ([place]) 07/15/2018  . Lactic acidosis 07/15/2018  . AKI (acute kidney injury) ([place]) 07/15/2018  . Elevated INR 07/15/2018  . Recurrent right pleural effusion 07/15/2018  . Palliative care by specialist   . Septic shock ([place]) 07/08/2018  . Pressure injury of skin 07/08/2018  . Encephalopathy acute   . 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 Goals of care, counseling/discussion 02/17/2013  . Breast cancer ([place]) 08/08/2012  . GERD (gastroesophageal reflux disease) 08/08/2012  . Hypothyroidism, unspecified 08/08/2012  . Osteoporosis 08/08/2012  . Aortic valve disorders 05/27/2012  . 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[place]) 05/27/2012  . Arthritis 05/27/2012  . HTN (hypertension) 05/27/2012  . Hyperlipidemia, unspecified 05/27/2012  . DVT (deep venous thrombosis) ([place])   . AS (aortic stenosis) 05/24/2012  . CAD (coronary artery disease) 05/24/2012  . S/P CABG x 5 10/25/1995    Palliative Care Assessment & Plan   Patient Profile:  85 y.o. female  with past medical history of aortic stenosis status post TAVR 2014, acute on chronic congestive heart failure, coronary artery disease status post CABG, history of breast cancer status post right mastectomy, history of DVT, severe osteoporosis admitted on 07/08/2018 with severe back pain, GI bleed and septic shock.  CTC revealed new, acute shear fracture at T10.  Patient also has a known compression fracture at L1.  CT scan of the chest revealed bilateral pleural effusions.  Patient has had  a left upper lobe nodule that has been monitored since 2017.  In 2017 it was 8 mm x 5 mm.  CT of the chest performed on 07/14/2018 shows nodule now 13 mm x 9 mm.  Consult ordered for goals of care.   Assessment/Recommendations/Plan   MOST completed  Recommend outpatient Palliative referral at facility- discharging MD please request in discharge summary and instructions  Goals of Care and Additional Recommendations:  Limitations on Scope of Treatment: Full Scope Treatment  Code Status:  Full code  Prognosis:   Unable to determine - I am concerned that patient will continue to have decline at facility with limited mobility  Discharge Planning:  [facility] for rehab with Palliative care service follow-up  Care plan was discussed with patient's daughter and spouse.  Thank you for allowing the Palliative Medicine Team to assist in the care of this patient.   Time In: 1400 Time Out: 1450 Total Time 50 mins Prolonged Time Billed yes      Greater than 50%  of this time was spent counseling and coordinating care related to the above assessment and plan.  Mariana Kaufman, AGNP-C Palliative Medicine   Please contact Palliative Medicine Team phone at [phone] for questions and concerns.

## 2018-07-19 NOTE — TOC Transition Note (Signed)
Transition of Care [facility]) - CM/SW Discharge Note   Patient Details  Name: Becky Mcmahon MRN: [id number] Date of Birth: 08-17-1930  Transition of Care [facility]) CM/SW Contact:  Eileen Stanford, LCSW Phone Number: 07/19/2018, 11:45 AM   Clinical Narrative:       Final next level of care: Skilled Nursing Facility Barriers to Discharge: No Barriers Identified   Patient Goals and CMS Choice Patient states their goals for this hospitalization and ongoing recovery are:: to return home with spouse and daughter after SNF--per daughter CMS Medicare.gov Compare Post Acute Care list provided to:: Patient Choice offered to / list presented to : Patient  Discharge Placement PASRR number recieved: 07/12/18            Patient chooses bed at: [facility] [place]   Name of family member notified: Melissa Patient and family notified of of transfer: 07/19/18  Discharge Plan and Services                        Social Determinants of Health (SDOH) Interventions     Readmission Risk Interventions No flowsheet data found.

## 2018-07-19 NOTE — Progress Notes (Signed)
OT Treatment Note   07/19/18 1700  OT Visit Information  Last OT Received On 07/19/18  Assistance Needed +2 (Simultaneous filing. User may not have seen previous data.)  PT/OT/SLP Co-Evaluation/Treatment Yes  Reason for Co-Treatment Complexity of the patient's impairments (multi-system involvement);For patient/therapist safety  OT goals addressed during session ADL's and self-care  History of Present Illness 85 y.o. female admitted on 07/08/18 from home where she was found to be minimally responsive.  She was hypotensive and anemic in the ED.  Pt found to have hemorrhagic vs septic shock, GI hemorrhage, ABLA s/p 2 units PRBC, and acute metabolic encepholopathy.  Pt with other significant PMH of fall with thoracic compression fx, Per Neurosurgery, TLSO to mobilize;  DM2, CABG, HTN, DVT, CAD, R Breast CA s/p mastectomy, R hip arthroplasty, and aortic valve replacement.   (Simultaneous filing. User may not have seen previous data.)  Precautions  Precautions Fall;Back (Simultaneous filing. User may not have seen previous data.)  Precaution Comments at risk for skin breakdown  Required Braces or Orthoses Spinal Brace  Spinal Brace TLSO;Other (comment)  Spinal Brace Comments ok to donn in sitting  Pain Assessment  Pain Assessment Faces  Faces Pain Scale 10  Pain Location back  Pain Descriptors / Indicators Discomfort;Grimacing;Guarding;Moaning  Pain Intervention(s) Limited activity within patient's tolerance;Premedicated before session  Cognition  Arousal/Alertness Awake/alert  Behavior During Therapy Anxious  Overall Cognitive Status Impaired/Different from baseline  Area of Impairment Attention  Bed Mobility  Overal bed mobility Needs Assistance  Bed Mobility Rolling;Sidelying to Sit;Sit to Sidelying  Rolling Max assist;Total assist;+2 for physical assistance  Sidelying to sit +2 for physical assistance;Max assist  Supine to sit Max assist;+2 for physical assistance;Total assist  Sit  to supine HOB elevated;Max assist  General bed mobility comments pain and anxiety more controlled this visit, tolerating rolling to don TSLO. came to stiting EOB,   Balance  Overall balance assessment Needs assistance  Sitting-balance support Feet supported;Bilateral upper extremity supported  Sitting balance-Leahy Scale Poor  Sitting balance - Comments Posterior lean  Standing balance-Leahy Scale Zero  Restrictions  Weight Bearing Restrictions No  Transfers  General transfer comment not attempted due to pt requesitn to lay back down  General Comments  General comments (skin integrity, edema, etc.) Focus of session on donning TLSO for transport.  Attempted to donn in sitting although unable to donn in sitting due to severity of kyphosis. Pt returned to supine and completed donning of brace after brace adjusted. Daughter present at end of session and educated on need for therapist at facility to donn brace at bed level with rolling due to kyphosis. also educated daughter on apprpirate fitting of axilla straps under armpits. Daughter verbalized understanding.   OT - End of Session  Activity Tolerance Patient limited by pain  Patient left in bed;with call bell/phone within reach;with bed alarm set;with family/visitor present  Nurse Communication Mobility status;Other (comment) (use of brace for mobility)  OT Assessment/Plan  OT Plan Discharge plan remains appropriate  OT Visit Diagnosis Other abnormalities of gait and mobility (R26.89);Muscle weakness (generalized) (M62.81);Cognitive communication deficit (R41.841);Pain  Pain - part of body  (back)  OT Frequency (ACUTE ONLY) Min 2X/week  Follow Up Recommendations SNF;Supervision/Assistance - 24 hour  OT Equipment Other (comment) (TBA)  AM-PAC OT "6 Clicks" Daily Activity Outcome Measure (Version 2)  Help from another person eating meals? 3  Help from another person taking care of personal grooming? 2  Help from another person toileting,  which includes using  toliet, bedpan, or urinal? 1  Help from another person bathing (including washing, rinsing, drying)? 2  Help from another person to put on and taking off regular upper body clothing? 2  Help from another person to put on and taking off regular lower body clothing? 1  6 Click Score 11  OT Goal Progression  Progress towards OT goals Not progressing toward goals - comment (duet o pain and pt's participation)  Acute Rehab OT Goals  Patient Stated Goal to decrease back pain and get strong enough to walk so she can go home again.   OT Goal Formulation With patient/family  Time For Goal Achievement 07/26/18  Potential to Achieve Goals Good  ADL Goals  Pt Will Perform Eating with modified independence;sitting  Pt Will Perform Grooming with set-up;sitting  Pt Will Perform Upper Body Bathing with set-up;sitting  Pt Will Transfer to Toilet with min assist;with +2 assist;bedside commode;stand pivot transfer  OT Time Calculation  OT Start Time (ACUTE ONLY) 1640  OT Stop Time (ACUTE ONLY) 1705  OT Time Calculation (min) 25 min  OT General Charges  $OT Visit 1 Visit  OT Treatments  $Self Care/Home Management  8-22 mins  Maurie Boettcher, OT/L   Acute OT Clinical Specialist [place] Pager [phone] Office [phone]

## 2018-07-19 NOTE — Progress Notes (Signed)
Physical Therapy Treatment Patient Details Name: Becky Mcmahon MRN: [id number] DOB: 02-16-1931 Today's Date: 07/19/2018    History of Present Illness 85 y.o. female admitted on 07/08/18 from home where she was found to be minimally responsive.  She was hypotensive and anemic in the ED.  Pt found to have hemorrhagic vs septic shock, GI hemorrhage, ABLA s/p 2 units PRBC, and acute metabolic encepholopathy.  Pt with other significant PMH of fall with thoracic compression fx, Per Neurosurgery, TLSO to mobilize;  DM2, CABG, HTN, DVT, CAD, R Breast CA s/p mastectomy, R hip arthroplasty, and aortic valve replacement.      PT Comments    Patient limited this visit by anxiety and pain. Screaming and grasping my hand with any slight motion even to distal portion of extremities and minor touch. Attempted education of benefits of mobility and encouragement, instruction for log roll to tolerate bed mobility and transfers. Patient at this time adamantly refusing all mobility. Will re-attempt with more assistance and coordinate with RN for pain control next session.     Follow Up Recommendations  SNF     Equipment Recommendations  Rolling walker with 5" wheels;3in1 (PT);Wheelchair (measurements PT);Wheelchair cushion (measurements PT);Hospital bed;Other (comment)    Recommendations for Other Services       Precautions / Restrictions Precautions Precautions: Fall;Back Precaution Comments: at risk for skin breakdown Required Braces or Orthoses: Spinal Brace Spinal Brace: Thoracolumbosacral orthotic;Other (comment)(can Don in [place]) Spinal Brace Comments: Opted to proceed with donning the brace rolling as most conservative approach unless otherwise ordered Restrictions Weight Bearing Restrictions: No    Mobility  Bed Mobility Overal bed mobility: Needs Assistance Bed Mobility: Rolling;Sidelying to Sit Rolling: Max assist;Total assist;+2 for physical assistance Sidelying to sit: +2 for  physical assistance;Max assist Supine to sit: Max assist;+2 for physical assistance;Total assist     General bed mobility comments: patient yelling in pain today and moaning, grasping my hand to stop all bed moblity. extensive encourgment and education to benefits of moblity. despite family wishing patient to move through the pain patient making it clear she does not want to move at this time. will re-attempt at later date.   Transfers                    Ambulation/Gait                 Stairs             Wheelchair Mobility    Modified Rankin (Stroke Patients Only)       Balance Overall balance assessment: Needs assistance Sitting-balance support: Feet supported;Bilateral upper extremity supported Sitting balance-Leahy Scale: Poor Sitting balance - Comments: Posterior lean     Standing balance-Leahy Scale: Zero                              Cognition Arousal/Alertness: Awake/alert Behavior During Therapy: Anxious Overall Cognitive Status: Impaired/Different from baseline Area of Impairment: Attention                                      Exercises Other Exercises Other Exercises: encouraged pt's family to complete BUE A/AA/PROM through full ROM x 20 3 x/day Other Exercises: Discussed importance of elevating B heels    General Comments        Pertinent Vitals/Pain Pain Assessment: Faces Faces Pain Scale:  Hurts worst Pain Location: back Pain Descriptors / Indicators: Aching;Discomfort;Crying Pain Intervention(s): Limited activity within patient's tolerance    Home Living                      Prior Function            PT Goals (current goals can now be found in the care plan section) Acute Rehab PT Goals Patient Stated Goal: to decrease back pain and get strong enough to walk so she can go home again.  PT Goal Formulation: With patient/family Time For Goal Achievement: 07/25/18 Potential to Achieve  Goals: Fair Progress towards PT goals: Progressing toward goals    Frequency    Min 2X/week      PT Plan Current plan remains appropriate    Co-evaluation              AM-PAC PT "6 Clicks" Mobility   Outcome Measure  Help needed turning from your back to your side while in a flat bed without using bedrails?: Total Help needed moving from lying on your back to sitting on the side of a flat bed without using bedrails?: A Lot Help needed moving to and from a bed to a chair (including a wheelchair)?: A Lot Help needed standing up from a chair using your arms (e.g., wheelchair or bedside chair)?: A Lot Help needed to walk in hospital room?: A Lot Help needed climbing 3-5 steps with a railing? : Total 6 Click Score: 10    End of Session Equipment Utilized During Treatment: Back brace;Gait belt Activity Tolerance: Patient limited by pain Patient left: in bed;with bed alarm set Nurse Communication: Mobility status PT Visit Diagnosis: Muscle weakness (generalized) (M62.81);Difficulty in walking, not elsewhere classified (R26.2);Pain     Time: 1100-1120 PT Time Calculation (min) (ACUTE ONLY): 20 min  Charges:  $Therapeutic Activity: 8-22 mins                     Reinaldo Berber, PT, DPT Acute Rehabilitation Services Pager: [phone] Office: [phone]     Reinaldo Berber 07/19/2018, 11:41 AM

## 2018-07-19 NOTE — Progress Notes (Signed)
ANTICOAGULATION CONSULT NOTE - Follow Up Consult  Pharmacy Consult for Coumadin Indication:  hx DVT, on lifelong anticoagulation  Allergies  Allergen Reactions  . Contrast Media  [Iodinated Diagnostic Agents] Itching  . Keflex [Cephalexin] Rash    Severe itching  . Latex Rash  . Neosporin [Neomycin-Bacitracin Zn-Polymyx] Rash    Patient Measurements: Height: 5' 4.02" (162.6 cm) Weight: 130 lb 4.7 oz (59.1 kg) IBW/kg (Calculated) : 54.74  Vital Signs: Temp: 97.8 F (36.6 C) (03/10 0850) Temp Source: Oral (03/10 0850) BP: 150/67 (03/10 0850) Pulse Rate: 81 (03/10 0850)  Labs: Recent Labs    07/17/18 0406 07/17/18 0916 07/18/18 0249 07/19/18 0313  HGB  --  10.7*  --   --   HCT  --  33.6*  --   --   PLT  --  192  --   --   LABPROT 19.7*  --  17.9* 19.6*  INR 1.7*  --  1.5* 1.7*    Estimated Creatinine Clearance: 43.8 mL/min (by C-G formula based on SCr of 0.81 mg/dL).   Assessment:   85 yr old female on lifelong Coumadin PTA for hx DVT x 2.  Admitted 2/28 with GI bleed and hemorrhagic shock.  INR was 5.2. Coumadin reversed with KCentra, FFP and Vitamin K. No evidence of ongoing bleeding. Coumadin to resumed 3/3 without bridge. Plan conservative dosing for slow rise to therapeutic INR. Continues on Protonix 40 mg PO BID.    INR trending up to 1.7 today after Coumadin 5 mg on 3/9.  Had risen slowly to 1.7 on Coumadin 2.5 mg x 5 days, then leveled off.  Dose increased to 4 mg on 3/8 but INR trended down on 3/9.      PTA Coumadin regimen:  5 mg MWFSat, 2.5 mg TTSun.  INR 5.2 on admit 2/28.  Daughter reported recent outpatient INR had been just below goal and 5 mg doses were increased from three to four times a week.  Goal of Therapy:  INR 2-3 Monitor platelets by anticoagulation protocol: Yes   Plan:   Coumadin 5 mg x 1 again today.  Daily PT/INR.  Suggest 5 mg MWF and 2.5 mg TTSS at discharge.  Arty Baumgartner, [place] Pager: [phone] or phone:  [phone] 07/19/2018,12:05 PM

## 2018-07-20 LAB — CULTURE, BODY FLUID W GRAM STAIN -BOTTLE

## 2018-07-20 LAB — PROTIME-INR
INR: 2 — AB (ref 0.8–1.2)
PROTHROMBIN TIME: 22.5 s — AB (ref 11.4–15.2)

## 2018-07-20 LAB — CULTURE, BODY FLUID-BOTTLE: CULTURE: NO GROWTH

## 2018-07-20 MED ORDER — HALOPERIDOL LACTATE 5 MG/ML IJ SOLN: 1.0000 mg | Freq: Once | INTRAMUSCULAR | Status: DC

## 2018-07-20 MED ORDER — MORPHINE SULFATE (PF) 2 MG/ML IV SOLN
1.0000 mg | Freq: Once | INTRAVENOUS | Status: AC
  Administered 2018-07-20: 1 mg via INTRAVENOUS
  Filled 2018-07-20: qty 1

## 2018-07-20 NOTE — TOC Transition Note (Signed)
Transition of Care [facility]) - CM/SW Discharge Note   Patient Details  Name: Becky Mcmahon MRN: [id number] Date of Birth: [birth year]  [facility] CM/SW Contact:  Eileen Stanford, LCSW Phone Number: 07/20/2018, 11:46 AM   Clinical Narrative:   Clinical Social Worker facilitated patient discharge including contacting patient family and facility to confirm patient discharge plans.  Clinical information faxed to facility and family agreeable with plan.  CSW arranged ambulance transport via [facility] to [facility] (room [phone]).  RN to call [phone] for report prior to discharge.      Final next level of care: Skilled Nursing Facility Barriers to Discharge: No Barriers Identified   Patient Goals and CMS Choice Patient states their goals for this hospitalization and ongoing recovery are:: to return home with spouse and daughter after SNF--per daughter CMS Medicare.gov Compare Post Acute Care list provided to:: Patient Choice offered to / list presented to : Patient  Discharge Placement PASRR number recieved: 07/12/18            Patient chooses bed at: [facility]   Name of family member notified: Melissa Patient and family notified of of transfer: 07/19/18  Discharge Plan and Services     Clinical Social Worker will sign off for now as social work intervention is no longer needed. Please consult [place] again if new need arises.                    Social Determinants of Health (SDOH) Interventions     Readmission Risk Interventions No flowsheet data found.

## 2018-07-20 NOTE — Progress Notes (Signed)
The chaplain attempted a spiritual care initial visit as a member of PMT.  The Pt. was participating in PT.  The chaplain in available for F/U visit as needed.

## 2018-07-20 NOTE — Discharge Summary (Addendum)
Discharge Summary  Becky Mcmahon CZY:[id number] DOB: 1930/12/17  PCP: Becky Solian, MD  Admit date: 07/08/2018 Discharge date: 07/20/2018   Time spent: < 25 minutes  Admitted From: home Disposition:  SNF  Recommendations for Outpatient Follow-up:  1. Follow up with PCP in 1 week, check BMP (creatinine), CBC (hemoglobin), INR, goal 2-3) Suggest 5 mg Coumadin Monday, Wednesday, Friday and 2.5 mg Tuesday, Thursday, Saturday, Sunday at discharge 2. Recommend outpatient palliative referral at facility 3. PRN pain control with robaxin, tramadol\ 4. Therapist at facility to donn brace at bed level with rolling due to kyphosis  ADDENDUM 07/20/2018: Patient was to be discharged yesterday but did not unfortunately go due to bed availability.  She was seen and examined his morning and no changes since yesterday and she is still stable to be discharged to skilled nursing facility and will need to follow-up with palliative care at the facility and follow-up with her TLSO brace.    Discharge Diagnoses:  Active Hospital Problems   Diagnosis Date Noted   Pleural effusion on right    Advanced care planning/counseling discussion    Acute bilateral thoracic back pain    Acute respiratory failure with hypoxia ([place]) 07/16/2018   Thoracic vertebral fracture ([place]) 07/15/2018   Severe sepsis with septic shock ([facility]) 07/15/2018   Lactic acidosis 07/15/2018   AKI (acute kidney injury) ([place]) 07/15/2018   Elevated INR 07/15/2018   Recurrent right pleural effusion 07/15/2018   Palliative care by specialist    Hemorrhagic shocks ([place]) 07/08/2018   Pressure injury of skin 07/08/2018   Encephalopathy acute    Goals of care, counseling/discussion 02/17/2013   Hypothyroidism, unspecified 08/08/2012   DVT (deep venous thrombosis) ([facility])    AS (aortic stenosis) 05/24/2012    Resolved Hospital Problems  No resolved problems to display.    Discharge Condition: STABLE   CODE  STATUS: FULL CODE History of present illness:  Becky Mcmahon is a 85 y.o. year old female with medical history significant for CAD s/p CABG,TAVR 2014 ([facility])breast CA s/p R mastectomy,recurrentDVTs on lifelong warfarin, HTN, HLD, mild dementia, cerebral atherosclerosis, R great toe nail plate infection (removed on 2/24) and started on doxycycline for cellulitis and a compression fracture who presented on 07/08/2018 with reports of nausea and abdominal pain prompting ED visit on 2/27 before being found minimally responsive by her daughter prior to admission and was found to have profound shock unclear if hemorrhagic or septic. Remaining hospital course addressed in problem based format below:   Hospital Course:   Acute hypoxic respiratory failure secondary to moderate-sized pleural effusions, improving.  No longer requiring oxygen at rest.  Status post thoracentesis on 3/6 with small volume removed.  Doubt infection given patient has remained afebrile, no concerning infectious etiology and pleural fluid analysis negative culture and Gram stain.   BNP is slightly elevated at 298. No peripheral edema  TTE shows preserved EF, no changes at TAVR site. Had previously been on Flagyl, Zosyn and Vancomycin in early part of admission (2/28-3/2).  -passed o2 screen now on room air, has appropriate brace, PT recs SNF, awaiting bed  Bilateral, exudatevepleural effusions, improved.  status post thoracentesis on 3/6.  270 cc of bloody fluid removed. Repeat CXR shows decrease in size.  Cytology  currently pending, elevated LDH consistent with exudative.  Some concern for malignancy given increase in size of pulmonary nodule mentioned on CT chest. Culture analysis negative.  Cytology negative for malignant cells.  No longer requiring oxygen  Increasing  left upper lobe pulmonary nodule, noted on CT chest on 3/5 (increased in size from 2017).  Has some features that are concerning for potential primary lung  malignancy.  Pleural fluid analysis negative for malignant cells -Discussed with family who agree with further evaluation as outpatient. -Outpatient follow-up with repeat chest imaging to monitor nodule  T10 shear fracture.  Evaluated by neurosurgery who recommends mobilization and TLSO brace as family is not interested in surgery given the risk versus benefits.  Remains neurologically intact. -Spinal Brace ( tolerating rolling to don TLSO, and donn in sitting but limited due to kyphosis). Therapist at facility to donn brace at bed level with rolling due to kyphosis provided on discharge, to use with exertion - Calcitonin nasal spray for 2 weeks for pain control -PRN Zanaflex for muscle spasm, tramadol as needed every 6 hours for pain, lidocaine patch  Shock unclear if hemorrhagic versus septic, resolved.  Presented with abdominal pain and witnessed melena as well as significant anemia.  Had significant hypothermia, tachypnea, tachycardia and profound hypotension requiring pressor support in ICU as well as a lactic acidosis greater than 11.  On vancomycin and Zosyn for 48 hours however blood cultures remain unremarkable,No localizing signs or symptoms of infection, chest x-ray/CT chest negative for infection.  Pleural fluid analysis of effusion negative for infection on Gram stain and culture  GI hemorrhage/acute blood loss anemia, resolved.  In setting of elevated INR 5.2 on admission.    GI did not recommend endoscopic interventions on admission due to profound hemodynamic instability initially.  Hemoglobin remained stable at 11 after receiving 4 PRBC blood transfusions earlier in admission currently and patient tolerating being on warfarin without any active signs of bleeding.   -Closely monitor INR as mentioned below; Now INR is 2.0  Elevated INR, resolved. s/p 2 units of fresh frozen plasma to reverse INR during hospital stay. -Daily INR, pharmacy protocol; Now INR is 2.0  Hx of DVT, on  lifelong warfarin ( 2 episodes of DVT), INR 1.7 on discharge.  Given Coumadin 5 mg x 1 on 3/10.  Tolerating reinitiation of Coumadin without any bleeding -Suggest 5 mg Coumadin Monday, Wednesday, Friday and 2.5 mg Tuesday, Thursday, Saturday, Sunday at discharge -Will need to monitor INR at SNF, goal INR 2-3   Acute metabolic encephalopathy, resolved in setting of severe shock as most likely contributor.  CT head negative for any acute abnormalities.   =avoid sedating medications. -Closely monitor  Aortic stenosis s/p TAVR (2014).   Repeat TTE on 3/6 shows preserved valvefunction.  Hypernatremia, resolved.  Related to dehydration related to increased sedation. Briefly needed D5. Doing well now   AKI, resolved.  Likely prerenal etiology related to hypotension from shock physiology.  Held lisinopril throughout hospital stay.  Creatinine returned to baseline (0.8-0.9) -Can resume home lisinopril repeat BMP on follow-up  Hypothyroidism, stable -Continue Synthroid  Dementia, conversant and appropriate. -Home aricept.  Goals of care Family completed MOST form placed in hart. Elects for Full Code. Palliative medicine assisted management of pain/symptom control and goals of care discussion during admission - Recommend outpatient palliative referral at facility   Consultations:  PCCM, GI, NSG, Palliative  Procedures/Studies: 3/6 thoracentesis 3/6 TTE  The left ventricle has normal systolic function with an ejection fraction of 60-65%. The cavity size was normal. Left ventricular diastolic parameters were normal.  2. The right ventricle has normal systolic function. The cavity was normal. There is no increase in right ventricular wall thickness.  3. The mitral valve  is degenerative. Mild thickening of the mitral valve leaflet. Mild calcification of the mitral valve leaflet. There is mild mitral annular calcification present.  4. The tricuspid valve is normal in structure.  5.  Aortic valve regurgitation is trivial by color flow Doppler.  6. Post TAVR with 29 mm Core Valve trivial peri valvular regurgitation.  7. The pulmonic valve was grossly normal. Pulmonic valve regurgitation is mild by color flow Doppler.  Discharge Exam: BP 132/61 (BP Location: Left Arm)    Pulse 77    Temp 97.8 F (36.6 C)    Resp 14    Ht 5' 4.02" (1.626 m)    Wt 59.1 kg    SpO2 97%    BMI 22.35 kg/m   General: frail elderly female lying in bed, no apparent distress Eyes: EOMI, anicteric ENT: Oral Mucosa clear and moist Cardiovascular: regular rate and rhythm, no murmurs, rubs or gallops, no edema, Respiratory: Normal respiratory effort on room air, lungs clear to auscultation bilaterally on anterior chest ( limited due to poor mobility in bed without brace) Abdomen: soft, non-distended, non-tender, normal bowel sounds Skin: No Rash Neurologic: Grossly no focal neuro deficit.Mental status AAOx3, speech normal, Psychiatric:Appropriate affect, and mood   Discharge Instructions You were cared for by a hospitalist during your hospital stay. If you have any questions about your discharge medications or the care you received while you were in the hospital after you are discharged, you can call the unit and asked to speak with the hospitalist on call if the hospitalist that took care of you is not available. Once you are discharged, your primary care physician will handle any further medical issues. Please note that NO REFILLS for any discharge medications will be authorized once you are discharged, as it is imperative that you return to your primary care physician (or establish a relationship with a primary care physician if you do not have one) for your aftercare needs so that they can reassess your need for medications and monitor your lab values.  Discharge Instructions    Diet - low sodium heart healthy   Complete by:  As directed    Increase activity slowly   Complete by:  As directed       Allergies as of 07/20/2018      Reactions   Contrast Media  [iodinated Diagnostic Agents] Itching   Keflex [cephalexin] Rash   Severe itching   Latex Rash   Neosporin [neomycin-bacitracin Zn-polymyx] Rash      Medication List    STOP taking these medications   aspirin EC 81 MG tablet   doxycycline 100 MG tablet Commonly known as:  VIBRA-TABS   ibuprofen 200 MG tablet Commonly known as:  ADVIL,MOTRIN   mirtazapine 7.5 MG tablet Commonly known as:  REMERON   Multi-Vitamins Tabs   MULTIVITAMIN PO   nystatin powder Generic drug:  nystatin     TAKE these medications   atorvastatin 20 MG tablet Commonly known as:  LIPITOR Take 1 tablet (20 mg total) by mouth every evening.   calcitonin (salmon) 200 UNIT/ACT nasal spray Commonly known as:  MIACALCIN/FORTICAL Place 1 spray into alternate nostrils daily for 14 days.   cetirizine 10 MG tablet Commonly known as:  ZYRTEC Take 1 tablet (10 mg total) by mouth daily.   donepezil 5 MG tablet Commonly known as:  ARICEPT Take 1 tablet (5 mg total) by mouth at bedtime.   fluticasone 50 MCG/ACT nasal spray Commonly known as:  FLONASE Place 1 spray  into both nostrils daily.   levothyroxine 50 MCG tablet Commonly known as:  SYNTHROID, LEVOTHROID Take 1 tablet (50 mcg total) by mouth daily before breakfast.   lidocaine 5 % Commonly known as:  LIDODERM Place 2 patches onto the skin daily. Remove & Discard patch within 12 hours or as directed by MD   lisinopril 10 MG tablet Commonly known as:  PRINIVIL,ZESTRIL Take 1 tablet (10 mg total) by mouth daily.   LORazepam 0.5 MG tablet Commonly known as:  ATIVAN Take 0.5 tablets (0.25 mg total) by mouth 2 (two) times daily as needed for anxiety. Takes at lunchtime and bedtime. What changed:    how much to take  when to take this  reasons to take this   pantoprazole 40 MG tablet Commonly known as:  PROTONIX Take 1 tablet (40 mg total) by mouth 2 (two) times daily before  a meal.   tiZANidine 4 MG capsule Commonly known as:  ZANAFLEX Take 1 capsule (4 mg total) by mouth 3 (three) times daily as needed for muscle spasms.   traMADol 50 MG tablet Commonly known as:  ULTRAM Take 1 tablet (50 mg total) by mouth every 6 (six) hours as needed for up to 7 days for severe pain.   vitamin C 1000 MG tablet Take 1 tablet (1,000 mg total) by mouth daily.   Vitamin D 50 MCG (2000 UT) tablet Take 1 tablet (2,000 Units total) by mouth every evening.   warfarin 5 MG tablet Commonly known as:  COUMADIN 5 mg Mon, Wed, Fri 2.5 mg tues, thurs, Sat, Sun What changed:    how much to take  how to take this  when to take this  additional instructions      Allergies  Allergen Reactions   Contrast Media  [Iodinated Diagnostic Agents] Itching   Keflex [Cephalexin] Rash    Severe itching   Latex Rash   Neosporin [Neomycin-Bacitracin Zn-Polymyx] Rash   Contact information for after-discharge care    [facility] .   Service:  Skilled Nursing Contact information: [address] [place] [phone]               The results of significant diagnostics from this hospitalization (including imaging, microbiology, ancillary and laboratory) are listed below for reference.    Significant Diagnostic Studies: Ct Abdomen Pelvis Wo Contrast  Result Date: 07/07/2018 CLINICAL DATA:  Flank pain EXAM: CT ABDOMEN AND PELVIS WITHOUT CONTRAST TECHNIQUE: Multidetector CT imaging of the abdomen and pelvis was performed following the standard protocol without IV contrast. COMPARISON:  CT abdomen pelvis 01/28/2018 FINDINGS: LOWER CHEST: Large right and small left pleural effusions. Large hiatal hernia. There is a proximal aortic stent. HEPATOBILIARY: The hepatic contours and density are normal. There is no intra- or extrahepatic biliary dilatation. Status post cholecystectomy. PANCREAS: The  pancreatic parenchymal contours are normal and there is no ductal dilatation. There is no peripancreatic fluid collection. SPLEEN: Normal. ADRENALS/URINARY TRACT: --Adrenal glands: Normal. --Right kidney/ureter: No hydronephrosis, nephroureterolithiasis, perinephric stranding or solid renal mass. --Left kidney/ureter: There is a lower pole cyst measuring 2.2 cm. No hydronephrosis, nephroureterolithiasis, perinephric stranding or solid renal mass. --Urinary bladder: Normal for degree of distention STOMACH/BOWEL: --Stomach/Duodenum: Large hiatal hernia. Normal duodenal course. --Small bowel: No dilatation or inflammation. --Colon: No focal abnormality. --Appendix: Not visualized. No right lower quadrant inflammation or free fluid. VASCULAR/LYMPHATIC: There is aortic atherosclerosis without hemodynamically significant stenosis. No abdominal or pelvic lymphadenopathy. REPRODUCTIVE: No  free fluid in the pelvis. No adnexal mass. MUSCULOSKELETAL. Postsurgical changes of the right bony pelvis. Multilevel degenerative disc disease and facet arthrosis with chronic compression fracture of L1. There is also a chronic compression deformity of T9. OTHER: None. IMPRESSION: 1. No acute abdominal or pelvic abnormality. 2. Large hiatal hernia and large right, small left pleural effusions. Aortic Atherosclerosis (ICD10-I70.0). Electronically Signed   By: Ulyses Jarred M.D.   On: 07/07/2018 15:35   Dg Chest 1 View  Result Date: 07/15/2018 CLINICAL DATA:  Post thoracentesis EXAM: CHEST  1 VIEW COMPARISON:  07/14/2018, 07/13/2018, 07/08/2018 FINDINGS: Small bilateral pleural effusions, right greater than left, decreased on the right side. No definitive pneumothorax. Cardiomegaly with enlarged mediastinal silhouette, aortic atherosclerosis, and ascending aortic graft. Dense bibasilar airspace disease. Large hiatal hernia. IMPRESSION: 1. Small bilateral pleural effusions, slightly decreased on the right side. No definite right  pneumothorax 2. Stable cardiomediastinal silhouette with bibasilar airspace disease Electronically Signed   By: Donavan Foil M.D.   On: 07/15/2018 16:10   Dg Chest 2 View  Result Date: 07/07/2018 CLINICAL DATA:  Back pain and leukocytosis EXAM: CHEST - 2 VIEW COMPARISON:  12/27/2017 chest radiograph FINDINGS: Prior median sternotomy with CABG and aortic endograft. There is blunting of the costophrenic angles, likely pleural effusion or chronic pleural scarring. Unchanged multilevel thoracic compression deformities. No focal airspace consolidation. IMPRESSION: No active cardiopulmonary disease. Bilateral pleural scarring or small effusion. Electronically Signed   By: Ulyses Jarred M.D.   On: 07/07/2018 18:01   Ct Head Wo Contrast  Result Date: 07/08/2018 CLINICAL DATA:  Altered level of consciousness. EXAM: CT HEAD WITHOUT CONTRAST TECHNIQUE: Contiguous axial images were obtained from the base of the skull through the vertex without intravenous contrast. COMPARISON:  04/07/2016 FINDINGS: Brain: There is no evidence of acute infarct, intracranial hemorrhage, mass, midline shift, or extra-axial fluid collection. Cerebral atrophy is unchanged. Cerebral white matter hypodensities are similar to the prior study and nonspecific but compatible with mild chronic small vessel ischemic disease. Vascular: Calcified atherosclerosis at the skull base. No hyperdense vessel. Skull: No fracture or focal osseous lesion. Sinuses/Orbits: Unchanged right sphenoid sinus mucous retention cyst. Clear mastoid air cells. Bilateral cataract extraction. Other: Small metallic object in the left external auditory canal, possibly a portion of a hearing aid. IMPRESSION: 1. No evidence of acute intracranial abnormality. 2. Mild chronic small vessel ischemic disease. Electronically Signed   By: Logan Bores M.D.   On: 07/08/2018 13:11   Ct Chest Wo Contrast  Result Date: 07/14/2018 CLINICAL DATA:  Shock and altered mental status with  unclear etiology. Two recent falls. Status post CABG status post right mastectomy for breast cancer. EXAM: CT CHEST WITHOUT CONTRAST TECHNIQUE: Multidetector CT imaging of the chest was performed following the standard protocol without IV contrast. COMPARISON:  Portable chest obtained earlier today. Chest CT dated 04/07/2016. FINDINGS: Cardiovascular: Post CABG changes and aortic valve stent are again demonstrated. Dense coronary artery and aortic calcifications. Normal sized heart. Diffuse low density of the blood relative to the arterial walls. Dense bilateral carotid artery calcifications. Mediastinum/Nodes: No significant change in a large hiatal hernia. No enlarged lymph nodes. Unremarkable thyroid gland. Lungs/Pleura: The previously demonstrated 8 x 5 mm nodular density in the left upper lobe has a less linear and more irregular appearance today, measuring 13 x 9 mm on image number 42 series 4. Stable linear scarring more anteriorly in the left upper lobe. Stable small calcified granuloma more inferiorly in the left upper lobe. Interval  adjacent 4 mm and 3 mm nodular densities in the central right upper lobe on images 39 and 37 respectively. At least a portion of the 4 mm nodular density appears represent a small focal mucous plug small or small endobronchial nodule. Small to moderate-sized right pleural effusion and small left pleural effusion. Associated bilateral lower lobe compressive atelectasis, right greater than left. There is also mild dependent atelectasis in the right upper lobe. Upper Abdomen: Extensive atheromatous arterial calcifications. Cholecystectomy clips. Musculoskeletal: Stable 75% L1 vertebral compression deformity. Interval 90% T7 and 70% T9 compression deformities with no visible acute fracture lines. Interval left transverse shear fracture through the inferior aspect of the T10 vertebral body. The inferior fragment is fused with the T11 vertebral body. There is 1.4 cm of distraction of  the fragments anteriorly with acute lordosis. Acute fracture lines are visualized. The posterior elements are intact. IMPRESSION: 1. Interval acute transverse shear fracture through the inferior aspect of the T10 vertebral body with 1.4 cm of distraction of the fragments anteriorly and associated acute lordosis. The posterior elements are intact. 2. 13 x 9 mm enlarging, irregular nodule in the left upper lobe with features highly suspicious for a small primary lung carcinoma. 3. Interval adjacent 4 mm and 3 mm nodular densities in the central right upper lobe. A portion of the 4 mm nodular density is endobronchial in location. These could represent small metastases, areas of mucous plugging or additional primary malignancy. A nodular infectious process is also possible. 4. Interval small to moderate-sized right pleural effusion and small left pleural effusion. 5. Bilateral lower lobe compressive atelectasis, right greater than left. 6. Stable large hiatal hernia. 7. Post CABG changes with dense calcific coronary artery and aortic atherosclerosis. 8. Bilateral carotid artery dense calcific atherosclerosis. Critical Value/emergent results were called by telephone at the time of interpretation on 07/14/2018 at 5:20 pm to Dr. Berle Mull , who verbally acknowledged these results. Electronically Signed   By: Claudie Revering M.D.   On: 07/14/2018 17:53   Dg Chest Port 1 View  Result Date: 07/14/2018 CLINICAL DATA:  Chest tube present.  Right pleural effusion. EXAM: PORTABLE CHEST 1 VIEW COMPARISON:  07/13/2018 FINDINGS: Stable cardiomegaly. Stable bilateral pleural effusions and volume loss in the lungs. Large hiatal hernia is noted. No pneumothorax. Normal vascularity. Postoperative changes. IMPRESSION: Stable bilateral pleural effusions. Electronically Signed   By: Marybelle Killings M.D.   On: 07/14/2018 10:07   Dg Chest Port 1 View  Result Date: 07/13/2018 CLINICAL DATA:  85 year old female with sudden onset of shortness  of breath today. EXAM: PORTABLE CHEST 1 VIEW COMPARISON:  Chest x-ray 07/08/2018. FINDINGS: Lung volumes are low. Extensive bibasilar opacities (right greater than left) appears similar to the prior study and may reflect areas of atelectasis and/or consolidation. Moderate right and small left pleural effusions. Large hiatal hernia. No definite pulmonary edema. Heart size count be evaluated secondary to patient positioning which distorts mediastinal contours. Aortic atherosclerosis. Postprocedural changes of prior TAVR procedure with CoreValve in position. Status post median sternotomy for CABG including LIMA. IMPRESSION: 1. Overall, the appearance the chest appears very similar to the prior examination, with moderate right and small left pleural effusions as well as bibasilar opacities which may reflect areas of atelectasis and/or consolidation. 2. Aortic atherosclerosis. Electronically Signed   By: Vinnie Langton M.D.   On: 07/13/2018 13:13   Dg Chest Portable 1 View  Result Date: 07/08/2018 CLINICAL DATA:  Hypoxia EXAM: PORTABLE CHEST 1 VIEW COMPARISON:  07/07/2018 FINDINGS:  Postsurgical changes are again seen and stable. Small bilateral pleural effusions are again identified and stable given some variation in the technical factors of the film. No focal infiltrate is seen. No acute bony abnormality is noted. IMPRESSION: Stable blunting of the costophrenic angles. No new focal abnormality is noted. Electronically Signed   By: Inez Catalina M.D.   On: 07/08/2018 12:41   Dg Foot Complete Left  Result Date: 07/04/2018 Please see detailed radiograph report in office note.  Dg Foot Complete Right  Result Date: 07/08/2018 CLINICAL DATA:  Right great toe discoloration and laceration. Infection/sepsis. EXAM: RIGHT FOOT COMPLETE - 3+ VIEW COMPARISON:  07/04/2018 FINDINGS: No acute fracture or dislocation is identified. The bones are diffusely osteopenic. There is great toe swelling without evidence of  underlying osseous erosion or soft tissue emphysema. Marginal osteophyte formation is most notable at the DIP joints of the second and third toes. A small plantar calcaneal enthesophyte is noted. There are extensive atherosclerotic vascular calcifications. IMPRESSION: Soft tissue swelling without evidence of acute osseous abnormality. Electronically Signed   By: Logan Bores M.D.   On: 07/08/2018 13:48   Dg Foot Complete Right  Result Date: 07/04/2018 Please see detailed radiograph report in office note.  Ir Thoracentesis Asp Pleural Space W/img Guide  Result Date: 07/15/2018 INDICATION: Shortness of breath. Right-sided pleural effusion. Request diagnostic and therapeutic thoracentesis. EXAM: ULTRASOUND GUIDED RIGHT THORACENTESIS MEDICATIONS: None. COMPLICATIONS: None immediate. Postprocedural chest x-ray negative for pneumothorax PROCEDURE: An ultrasound guided thoracentesis was thoroughly discussed with the patient and questions answered. The benefits, risks, alternatives and complications were also discussed. The patient understands and wishes to proceed with the procedure. Written consent was obtained. Ultrasound was performed to localize and mark an adequate pocket of fluid in the right chest. The area was then prepped and draped in the normal sterile fashion. 1% Lidocaine was used for local anesthesia. Under ultrasound guidance a 6 Fr Safe-T-Centesis catheter was introduced. Thoracentesis was performed. The catheter was removed and a dressing applied. FINDINGS: A total of approximately 270 mL of thin, bloody fluid was removed. Samples were sent to the laboratory as requested by the clinical team. IMPRESSION: Successful ultrasound guided right thoracentesis yielding 270 mL of pleural fluid. Read by: Ascencion Dike PA-C Electronically Signed   By: Jerilynn Mages.  Shick M.D.   On: 07/15/2018 15:47    Microbiology: Recent Results (from the past 240 hour(s))  Culture, body fluid-bottle     Status: None   Collection  Time: 07/15/18  2:53 PM  Result Value Ref Range Status   Specimen Description PLEURAL  Final   Special Requests FLUID  Final   Culture   Final    NO GROWTH 5 DAYS Performed at [facility], [address], [place], [place] [address]    Report Status 07/20/2018 FINAL  Final  Gram stain     Status: None   Collection Time: 07/15/18  2:53 PM  Result Value Ref Range Status   Specimen Description PLEURAL  Final   Special Requests FLUID  Final   Gram Stain   Final    ABUNDANT WBC PRESENT,BOTH PMN AND MONONUCLEAR NO ORGANISMS SEEN Performed at [facility], [place] [address] [place], [place] [address]    Report Status 07/15/2018 FINAL  Final     Labs: Basic Metabolic Panel: Recent Labs  Lab 07/13/18 1129 07/14/18 0230 07/15/18 0234  NA 145 146* 142  K 3.5 3.3* 3.9  CL 117* 114* 114*  CO2 21* 25  25  GLUCOSE 135* 103* 104*  BUN 16 15 13   CREATININE 0.87 0.88 0.81  CALCIUM 8.1* 7.8* 8.1*  MG  --  1.8  --    Liver Function Tests: Recent Labs  Lab 07/14/18 0230 07/15/18 0234  AST 46* 37  ALT 46* 43  ALKPHOS 102 123  BILITOT 0.3 0.5  PROT 4.9* 5.1*  ALBUMIN 1.9* 2.0*   No results for input(s): LIPASE, AMYLASE in the last 168 hours. No results for input(s): AMMONIA in the last 168 hours. CBC: Recent Labs  Lab 07/13/18 1701 07/14/18 0600 07/15/18 0804 07/16/18 0846 07/17/18 0916  WBC 19.0* 16.9* 15.9* 14.3* 12.5*  NEUTROABS 16.7* 13.7*  --   --   --   HGB 11.2* 11.0* 11.1* 10.7* 10.7*  HCT 35.2* 34.1* 35.3* 33.9* 33.6*  MCV 82.6 83.6 83.8 82.5 82.6  PLT 156 186 219 260 192   Cardiac Enzymes: No results for input(s): CKTOTAL, CKMB, CKMBINDEX, TROPONINI in the last 168 hours. BNP: BNP (last 3 results) Recent Labs    07/08/18 1218 07/13/18 1701  BNP 122.7* 298.3*    ProBNP (last 3 results) No results for input(s): PROBNP in the last 8760 hours.  CBG: No results for input(s): GLUCAP in the last 168 hours.   Signed:  Kerney Elbe,  MD [facility] 07/20/2018, 10:23 AM

## 2018-07-20 NOTE — Progress Notes (Signed)
OT Treatment Note    07/20/18 1300  OT Visit Information  Last OT Received On 07/20/18  Assistance Needed +2  PT/OT/SLP Co-Evaluation/Treatment Yes  Reason for Co-Treatment Complexity of the patient's impairments (multi-system involvement);For patient/therapist safety  History of Present Illness 85 y.o. female admitted on 07/08/18 from home where she was found to be minimally responsive.  She was hypotensive and anemic in the ED.  Pt found to have hemorrhagic vs septic shock, GI hemorrhage, ABLA s/p 2 units PRBC, and acute metabolic encepholopathy.  Pt with other significant PMH of fall with thoracic compression fx, Per Neurosurgery, TLSO to mobilize;  DM2, CABG, HTN, DVT, CAD, R Breast CA s/p mastectomy, R hip arthroplasty, and aortic valve replacement.    Precautions  Precautions Fall;Back  Precaution Comments at risk for skin breakdown  Required Braces or Orthoses Spinal Brace  Spinal Brace TLSO;Other (comment)  Spinal Brace Comments ok to donn in sitting  Pain Assessment  Pain Assessment Faces  Faces Pain Scale 10  Pain Location back  Pain Descriptors / Indicators Discomfort;Grimacing;Guarding;Moaning  Pain Intervention(s) Limited activity within patient's tolerance  Cognition  Arousal/Alertness Awake/alert  Behavior During Therapy Anxious  Overall Cognitive Status Impaired/Different from baseline  Area of Impairment Attention;Memory;Safety/judgement;Awareness;Problem solving  Upper Extremity Assessment  Upper Extremity Assessment Generalized weakness  Lower Extremity Assessment  Lower Extremity Assessment Defer to PT evaluation  Bed Mobility  Overal bed mobility Needs Assistance  Bed Mobility Rolling;Sidelying to Sit;Sit to Sidelying  Rolling Max assist;Total assist;+2 for physical assistance  Sidelying to sit +2 for physical assistance;Max assist  Supine to sit Max assist;+2 for physical assistance;Total assist  Sit to supine HOB elevated;Max assist  General bed mobility  comments tolerating rolling max A to adjust brace  Balance  Overall balance assessment Needs assistance  Sitting-balance support Feet supported;Bilateral upper extremity supported  Sitting balance-Leahy Scale Poor  Sitting balance - Comments Posterior lean  Standing balance-Leahy Scale Zero  Restrictions  Weight Bearing Restrictions No  Transfers  Overall transfer level Needs assistance  Equipment used 2 person hand held assist  Transfers Sit to/from Stand;Stand Pivot Transfers  Sit to Stand +2 safety/equipment;Mod assist  General transfer comment Mod A to stand pivot into chair.   General Comments  General comments (skin integrity, edema, etc.) TLSO does not fit appropriately in sitting due to forward head and kyphosis. Thoracic portion of bra ce removed to imprve fit. Nsg staff educated on need to ONLY wear brace when OOB in chair/walking  Exercises  Exercises Other exercises  OT - End of Session  Activity Tolerance Patient limited by pain  Patient left in chair;with call bell/phone within reach;with chair alarm set  Nurse Communication Mobility status;Other (comment) (management of brace)  OT Assessment/Plan  OT Plan Discharge plan remains appropriate  OT Visit Diagnosis Other abnormalities of gait and mobility (R26.89);Muscle weakness (generalized) (M62.81);Cognitive communication deficit (R41.841);Pain  Pain - part of body  (back)  OT Frequency (ACUTE ONLY) Min 2X/week  Follow Up Recommendations SNF;Supervision/Assistance - 24 hour  OT Equipment Other (comment)  AM-PAC OT "6 Clicks" Daily Activity Outcome Measure (Version 2)  Help from another person eating meals? 3  Help from another person taking care of personal grooming? 2  Help from another person toileting, which includes using toliet, bedpan, or urinal? 1  Help from another person bathing (including washing, rinsing, drying)? 2  Help from another person to put on and taking off regular upper body clothing? 2  Help  from another person to put  on and taking off regular lower body clothing? 1  6 Click Score 11  OT Goal Progression  Progress towards OT goals Progressing toward goals  Acute Rehab OT Goals  Patient Stated Goal to decrease back pain and get strong enough to walk so she can go home again.   OT Goal Formulation With patient/family  Time For Goal Achievement 07/26/18  Potential to Achieve Goals Good  ADL Goals  Pt Will Perform Eating with modified independence;sitting  Pt Will Perform Grooming with set-up;sitting  Pt Will Perform Upper Body Bathing with set-up;sitting  Pt Will Transfer to Toilet with min assist;with +2 assist;bedside commode;stand pivot transfer  OT Time Calculation  OT Start Time (ACUTE ONLY) 0935  OT Stop Time (ACUTE ONLY) 1005  OT Time Calculation (min) 30 min  OT General Charges  $OT Visit 1 Visit  OT Treatments  $Self Care/Home Management  8-22 mins  Maurie Boettcher, OT/L   Acute OT Clinical Specialist [place] Pager [phone] Office [phone]

## 2018-07-20 NOTE — Progress Notes (Signed)
Physical Therapy Treatment Patient Details Name: Becky Mcmahon MRN: [id number] DOB: [birth year] Today's Date: 07/20/2018    History of Present Illness 85 y.o. female admitted on 07/08/18 from home where she was found to be minimally responsive.  She was hypotensive and anemic in the ED.  Pt found to have hemorrhagic vs septic shock, GI hemorrhage, ABLA s/p 2 units PRBC, and acute metabolic encepholopathy.  Pt with other significant PMH of fall with thoracic compression fx, Per Neurosurgery, TLSO to mobilize;  DM2, CABG, HTN, DVT, CAD, R Breast CA s/p mastectomy, R hip arthroplasty, and aortic valve replacement.      PT Comments    Assisted patient with readjusting brace for comfort, patient tolerating OOB mobility today to chair. Plans to go to SNF today, agree with this plan.      Follow Up Recommendations  SNF     Equipment Recommendations  Rolling walker with 5" wheels;3in1 (PT);Wheelchair (measurements PT);Wheelchair cushion (measurements PT);Hospital bed;Other (comment)    Recommendations for Other Services       Precautions / Restrictions Precautions Precautions: Fall;Back Precaution Comments: at risk for skin breakdown Required Braces or Orthoses: Spinal Brace Spinal Brace: Thoracolumbosacral orthotic;Other (comment) Spinal Brace Comments: ok to donn in sitting Restrictions Weight Bearing Restrictions: No    Mobility  Bed Mobility Overal bed mobility: Needs Assistance Bed Mobility: Rolling;Sidelying to Sit;Sit to Sidelying Rolling: Max assist;Total assist;+2 for physical assistance Sidelying to sit: +2 for physical assistance;Max assist Supine to sit: Max assist;+2 for physical assistance;Total assist Sit to supine: HOB elevated;Max assist   General bed mobility comments: tolerating rolling max A to adjust brace  Transfers Overall transfer level: Needs assistance Equipment used: 2 person hand held assist Transfers: Sit to/from Stand;Stand Pivot Transfers Sit  to Stand: +2 safety/equipment;Mod assist         General transfer comment: Mod A to stand pivot into chair.   Ambulation/Gait                 Stairs             Wheelchair Mobility    Modified Rankin (Stroke Patients Only)       Balance Overall balance assessment: Needs assistance Sitting-balance support: Feet supported;Bilateral upper extremity supported Sitting balance-Leahy Scale: Poor Sitting balance - Comments: Posterior lean     Standing balance-Leahy Scale: Zero                              Cognition Arousal/Alertness: Awake/alert Behavior During Therapy: Anxious Overall Cognitive Status: Impaired/Different from baseline Area of Impairment: Attention                                      Exercises Other Exercises Other Exercises: encouraged pt's family to complete BUE A/AA/PROM through full ROM x 20 3 x/day Other Exercises: Discussed importance of elevating B heels    General Comments        Pertinent Vitals/Pain Pain Assessment: Faces Faces Pain Scale: Hurts worst Pain Location: back Pain Descriptors / Indicators: Discomfort;Grimacing;Guarding;Moaning Pain Intervention(s): Limited activity within patient's tolerance;Monitored during session    Home Living                      Prior Function            PT Goals (current goals can now be found in  the care plan section) Acute Rehab PT Goals Patient Stated Goal: to decrease back pain and get strong enough to walk so she can go home again.  PT Goal Formulation: With patient/family Time For Goal Achievement: 07/25/18 Potential to Achieve Goals: Fair Progress towards PT goals: Progressing toward goals    Frequency    Min 2X/week      PT Plan Current plan remains appropriate    Co-evaluation PT/OT/SLP Co-Evaluation/Treatment: Yes Reason for Co-Treatment: For patient/therapist safety          AM-PAC PT "6 Clicks" Mobility   Outcome  Measure  Help needed turning from your back to your side while in a flat bed without using bedrails?: Total Help needed moving from lying on your back to sitting on the side of a flat bed without using bedrails?: A Lot Help needed moving to and from a bed to a chair (including a wheelchair)?: A Lot Help needed standing up from a chair using your arms (e.g., wheelchair or bedside chair)?: A Lot Help needed to walk in hospital room?: A Lot Help needed climbing 3-5 steps with a railing? : Total 6 Click Score: 10    End of Session Equipment Utilized During Treatment: Back brace;Gait belt Activity Tolerance: Patient limited by pain Patient left: in bed;with bed alarm set Nurse Communication: Mobility status PT Visit Diagnosis: Muscle weakness (generalized) (M62.81);Difficulty in walking, not elsewhere classified (R26.2);Pain     Time: 0936-1000 PT Time Calculation (min) (ACUTE ONLY): 24 min  Charges:  $Therapeutic Activity: 8-22 mins                    Reinaldo Berber, PT, DPT Acute Rehabilitation Services Pager: [phone] Office: [phone]     Reinaldo Berber 07/20/2018, 10:48 AM

## 2018-07-22 ENCOUNTER — Encounter: Payer: Self-pay | Admitting: Internal Medicine

## 2018-07-22 ENCOUNTER — Other Ambulatory Visit: Payer: Self-pay

## 2018-07-22 ENCOUNTER — Non-Acute Institutional Stay: Payer: Medicare Other | Admitting: Internal Medicine

## 2018-07-22 VITALS — BP 130/74 | HR 92 | Resp 30 | Ht 66.0 in | Wt 121.0 lb

## 2018-07-22 DIAGNOSIS — Z515 Encounter for palliative care: Secondary | ICD-10-CM

## 2018-07-22 NOTE — Progress Notes (Signed)
Becky Mcmahon: [phone]  Fax: [phone]  PATIENT NAME: Becky Mcmahon DOB: [birth year] MRN: [id number]  Becky Mcmahon; facility admit date 07/20/2018  [place], [place], [place], [place] [address]   REFERRING PROVIDER:  (facility) Dr. Wenda Low / Suzzanne Cloud PA-C (facility SW) Becky Mcmahon  RESPONSIBLE PARTY: *(spouse) Marki Frede [phone]. (dtr) Iona Beard [phone]. (dtr) Kandice Hams [phone]  ASSESSMENT:     Weight is  Skilled ST for dysphagia (pureed), PT/OT  RECOMMENDATIONS and PLAN:  1. Functional decline 2/2 medical illnesses: Patient is pleasantly conversant; A & O X 3. Baseline p/t hospitalization was ambulating about with walker. She had progressive fatigue for about 7 months p/t hospitalization. Currently working with facility PT/OT. Daughter mentions patient has some baseline anxiety with walking d/t fear of falling. ST following for possible dysphagia. Currently a poor appetite. He ate about 20% of her lunch. He family has brought in Ensure supplements. She has generalized weakness and needs assist for all ADLs save for feeding. Her current weight is 121 lbs, which is a loss of 9 lbs from her baseline. At a height of 5'6" her BMI is 19.5 kg/m2.  2. Goals of Care:  Patient lived at home with husband of 69 years and daughter Becky Mcmahon. They (and the patient) are anxious to complete rehab and return to the home.   3. Advanced Care Directives: MOST form on chart: Full cardiopulmonary resuscitation, Full Scope of Treatment, Antibiotics/IVFs if indicated. I confirmed these wishes with patient's daughter Becky Mcmahon and husband Becky Mcmahon.  I spent 60 minutes providing this consultation, from 2:20pm to 3:30pm. More than 50% of the time in this consultation was spent coordinating communication, reconciling facility MAR with [facility] EMR,  and interviewing staff and family.   HISTORY OF PRESENT ILLNESS:  Becky Mcmahon is an 85 y.o. female with medical h/o  CAD s/p CABG,TAVR (2014 [facility]),breast CA (s/p R mastectomy),recurrentDVTs (lifelong warfarin), HTN, HLD, mild dementia, cerebral atherosclerosis, hypothyroidism, R great toe nail plate infection (removed on 2/24) and started on doxycycline for cellulitis and a compression fracture . She was recently discharged from  [facility] (admission 2/28-3/03/2019), where she was treated for acute hypoxic respiratory failure 2/2 bilat pleural effusions (neg cytology for malignant cells; neg for infection). CT: increaseing LUL pulmonary nodule.F/U eval as out pt. Treated for shock (? hemorrrhagic vs septic. INR 5.2/anemia/melana. Tx'd 4 PRBC2 U FFP Hgb 11 at discharge). Palliative Care was consulted for advanced care planning discussions, assist with symptom management, to help determining goals of care for patient and family, and addressing end of life decision making.  CODE STATUS: full code  PPS: 30% HOSPICE ELIGIBILITY/DIAGNOSIS: TBD  PAST MEDICAL HISTORY:  Past Medical History:  Diagnosis Date  . Anxiety   . Arthritis   . Breast cancer ([place])    Rt mastectomy  . CAD (coronary artery disease)   . Cerebral atherosclerosis    CAROTID DOPPLER, 06/18/2009 - RIGHT/LEFT BULB AND PROXIMAL ICAs-0-49% diameter reduction  . DVT (deep venous thrombosis) ([facility])    Recurrent, 1st episode after CABG 1997, 2nd episode after hip surgery 2001, placed on life-long coumadin ever since; LEXISCAN, 03/13/2008 - normal, no ECG changes, EKG negative for ischemia  . GERD (gastroesophageal reflux disease)   . Guaiac positive stools   . Hypercholesteremia   . Hyperlipidemia   . Hypertension   . Hypothyroidism   . Osteoporosis   . Ovarian  cyst   . Pancreatitis   . S/P CABG x 5 10/25/1995   LIMA to LAD, sequential SVG to RI and OM, sequential SVG to RCA and PDA, open vein harvest from both thighs and legs -  Dr Prescott Gum  . Severe aortic stenosis    2D ECHO, 04/22/2012 - EF 60-65%, aortic valve-heavily calcified with restricted leaflet motion, left atrium severly dilated  . Swelling of right lower extremity    LEA VENOUS DUPLEX SCAN, 08/18/2011 - no evidence of acute thrombus or thrombophlebitis  . Type II diabetes mellitus (HCC)    Diet controlled  . Vitamin D deficiency     SOCIAL HX:  Social History   Tobacco Use  . Smoking status: Former Smoker    Packs/day: 0.50    Years: 19.00    Pack years: 9.50    Last attempt to quit: 05/24/1969    Years since quitting: 49.1  . Smokeless tobacco: Never Used  Substance Use Topics  . Alcohol use: No    ALLERGIES:  Allergies  Allergen Reactions  . Contrast Media  [Iodinated Diagnostic Agents] Itching  . Keflex [Cephalexin] Rash    Severe itching  . Latex Rash  . Neosporin [Neomycin-Bacitracin Zn-Polymyx] Rash     PERTINENT MEDICATIONS:  Outpatient Encounter Medications as of 07/22/2018  Medication Sig  . Ascorbic Acid (VITAMIN C) 1000 MG tablet Take 1 tablet (1,000 mg total) by mouth daily.  Marland Kitchen atorvastatin (LIPITOR) 20 MG tablet Take 1 tablet (20 mg total) by mouth every evening.  . calcitonin, salmon, (MIACALCIN/FORTICAL) 200 UNIT/ACT nasal spray Place 1 spray into alternate nostrils daily for 14 days.  . cetirizine (ZYRTEC) 10 MG tablet Take 1 tablet (10 mg total) by mouth daily.  . Cholecalciferol (VITAMIN D) 50 MCG (2000 UT) tablet Take 1 tablet (2,000 Units total) by mouth every evening.  . donepezil (ARICEPT) 5 MG tablet Take 1 tablet (5 mg total) by mouth at bedtime.  . fluticasone (FLONASE) 50 MCG/ACT nasal spray Place 1 spray into both nostrils daily.  Marland Kitchen levothyroxine (SYNTHROID, LEVOTHROID) 50 MCG tablet Take 1 tablet (50 mcg total) by mouth daily before breakfast.  . lidocaine (LIDODERM) 5 % Place 2 patches onto the skin daily. Remove & Discard patch within 12 hours or as directed by MD  . lisinopril (PRINIVIL,ZESTRIL) 10 MG  tablet Take 1 tablet (10 mg total) by mouth daily.  Marland Kitchen LORazepam (ATIVAN) 0.5 MG tablet Take 0.5 tablets (0.25 mg total) by mouth 2 (two) times daily as needed for anxiety. Takes at lunchtime and bedtime.  . pantoprazole (PROTONIX) 40 MG tablet Take 1 tablet (40 mg total) by mouth 2 (two) times daily before a meal.  . tiZANidine (ZANAFLEX) 4 MG capsule Take 1 capsule (4 mg total) by mouth 3 (three) times daily as needed for muscle spasms.  . traMADol (ULTRAM) 50 MG tablet Take 1 tablet (50 mg total) by mouth every 6 (six) hours as needed for up to 7 days for severe pain.  Marland Kitchen warfarin (COUMADIN) 5 MG tablet 5 mg Mon, Wed, Fri 2.5 mg tues, thurs, Sat, Sun   No facility-administered encounter medications on file as of 07/22/2018.     PHYSICAL EXAM:  BP 130/74, HR: 92, RR 30 Alert and pleasantly conversant, frail appearing elderly Caucasian female. O2 at 2. She appears SOB but patient denies dyspnea.  General: NAD, frail appearing, thin Cardiovascular: regular rate and rhythm without MRG Pulmonary: clear ant fields Abdomen: soft, nontender, + bowel sounds GU: no suprapubic  tenderness Extremities: no edema, no joint deformities Skin: no rashes Neurological: Weakness but otherwise nonfocal  Julianne Handler, NP

## 2018-07-25 ENCOUNTER — Ambulatory Visit: Payer: Medicare Other | Admitting: Podiatry

## 2018-11-04 ENCOUNTER — Telehealth: Payer: Self-pay | Admitting: Internal Medicine

## 2018-11-04 NOTE — Telephone Encounter (Signed)
Talked with patient's daughter Lenna Sciara and have scheduled a Telephone f/u Palliative visit for 11/08/18 @ 9 AM.  Patient was recently discharged from facility to home and Dr. Dagmar Hait was in agreement to continue Palliative services at home.

## 2018-11-07 ENCOUNTER — Telehealth: Payer: Self-pay

## 2018-11-07 NOTE — Telephone Encounter (Signed)
Received message from patient's daughter who declined palliative care services.

## 2018-11-08 ENCOUNTER — Other Ambulatory Visit: Payer: Medicare Other | Admitting: Internal Medicine

## 2018-12-19 ENCOUNTER — Other Ambulatory Visit (HOSPITAL_COMMUNITY): Payer: Self-pay

## 2018-12-19 DIAGNOSIS — R1310 Dysphagia, unspecified: Secondary | ICD-10-CM

## 2018-12-27 ENCOUNTER — Ambulatory Visit (HOSPITAL_COMMUNITY)
Admission: RE | Admit: 2018-12-27 | Discharge: 2018-12-27 | Disposition: A | Discharge status: Home / Self Care | Payer: Medicare Other | Source: Ambulatory Visit | Attending: Internal Medicine | Admitting: Internal Medicine

## 2018-12-27 ENCOUNTER — Other Ambulatory Visit: Payer: Self-pay

## 2018-12-27 DIAGNOSIS — R1310 Dysphagia, unspecified: Secondary | ICD-10-CM | POA: Insufficient documentation

## 2019-05-10 ENCOUNTER — Other Ambulatory Visit: Payer: Self-pay

## 2019-05-10 ENCOUNTER — Ambulatory Visit (INDEPENDENT_AMBULATORY_CARE_PROVIDER_SITE_OTHER): Payer: Medicare Other

## 2019-05-10 ENCOUNTER — Ambulatory Visit: Payer: Medicare Other | Admitting: Podiatry

## 2019-05-10 DIAGNOSIS — L97529 Non-pressure chronic ulcer of other part of left foot with unspecified severity: Secondary | ICD-10-CM | POA: Diagnosis not present

## 2019-05-10 DIAGNOSIS — I83025 Varicose veins of left lower extremity with ulcer other part of foot: Secondary | ICD-10-CM

## 2019-05-10 DIAGNOSIS — L97522 Non-pressure chronic ulcer of other part of left foot with fat layer exposed: Secondary | ICD-10-CM

## 2019-05-10 MED ORDER — SANTYL 250 UNIT/GM EX OINT: 1.0000 "application " | TOPICAL_OINTMENT | Freq: Every day | CUTANEOUS | 2 refills | Status: DC

## 2019-05-17 NOTE — Progress Notes (Signed)
Subjective:  86 y.o. female presenting today with a chief complaint of an ulceration of the dorsolateral left midfoot that has been present for the past three months. She denies any pain or modifying factors. She has been applying antibiotic ointment for treatment. Patient is here for further evaluation and treatment.    Past Medical History:  Diagnosis Date  . Anxiety   . Arthritis   . Breast cancer ([place])    Rt mastectomy  . CAD (coronary artery disease)   . Cerebral atherosclerosis    CAROTID DOPPLER, 06/18/2009 - RIGHT/LEFT BULB AND PROXIMAL ICAs-0-49% diameter reduction  . DVT (deep venous thrombosis) (HCC)    Recurrent, 1st episode after CABG 1997, 2nd episode after hip surgery 2001, placed on life-long coumadin ever since; LEXISCAN, 03/13/2008 - normal, no ECG changes, EKG negative for ischemia  . GERD (gastroesophageal reflux disease)   . Guaiac positive stools   . Hypercholesteremia   . Hyperlipidemia   . Hypertension   . Hypothyroidism   . Osteoporosis   . Ovarian cyst   . Pancreatitis   . 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 Severe aortic stenosis    2D ECHO, 04/22/2012 - EF 60-65%, aortic valve-heavily calcified with restricted leaflet motion, left atrium severly dilated  . Swelling of right lower extremity    LEA VENOUS DUPLEX SCAN, 08/18/2011 - no evidence of acute thrombus or thrombophlebitis  . Type II diabetes mellitus (HCC)    Diet controlled  . Vitamin D deficiency      Objective/Physical Exam General: The patient is alert and oriented x3 in no acute distress.  Dermatology:  Wound #1 noted to the to the left lateral foot measuring approximately 0.5 x 0.5 x 0.1 cm (LxWxD).   To the noted ulceration(s), there is no eschar. There is a moderate amount of slough, fibrin, and necrotic tissue noted. Granulation tissue and wound base is red.  There is a minimal amount of serosanguineous drainage noted. There is no exposed bone muscle-tendon ligament or joint. There is no malodor. Periwound integrity is intact. Skin is warm, dry and supple bilateral lower extremities.  Vascular: Palpable pedal pulses bilaterally. Mild edema noted. Capillary refill within normal limits. Varicosities noted bilateral lower extremities.   Neurological: Epicritic and protective threshold diminished bilaterally.   Musculoskeletal Exam: Range of motion within normal limits to all pedal and ankle joints bilateral. Muscle strength 5/5 in all groups bilateral.   Assessment: #1 ulceration of the left lateral foot secondary to venous insufficiency #2 varicosities bilateral lower extremities  Plan of Care:  #1 Patient was evaluated. #2 medically necessary excisional debridement including subcutaneous tissue was performed using a tissue nipper and a chisel blade. Excisional debridement of all the necrotic nonviable tissue down to healthy bleeding viable tissue was performed with post-debridement measurements same as pre-. #3 the wound was cleansed with normal saline and dry sterile dressing applied.  #4 Prescription for Santyl ointment provided to patient.  #5 Offloading pads dispensed.  #6 Return to clinic in 4 weeks.     Edrick Kins, DPM [facility]  Dr. Edrick Kins, DPM    [place]  [place], [place] [address]                Office [phone]  Fax [phone]

## 2019-05-22 ENCOUNTER — Telehealth: Payer: Self-pay | Admitting: *Deleted

## 2019-05-22 MED ORDER — SANTYL 250 UNIT/GM EX OINT: 1.0000 "application " | TOPICAL_OINTMENT | Freq: Every day | CUTANEOUS | 2 refills | Status: AC

## 2019-05-22 NOTE — Telephone Encounter (Signed)
[place] request wound measurement, number of wounds, 30 day supply.

## 2019-05-22 NOTE — Telephone Encounter (Signed)
Lynn transferred to [place]., Pharmacist and I informed of wound measurement 0.5 x 0.5 x 0.1cm left lateral foot and 90 gram Santyl was suggested with ability to decrease to 30 gram increments.

## 2019-05-25 ENCOUNTER — Ambulatory Visit: Payer: Medicare Other | Attending: Internal Medicine

## 2019-05-25 DIAGNOSIS — Z23 Encounter for immunization: Secondary | ICD-10-CM | POA: Insufficient documentation

## 2019-05-25 NOTE — Progress Notes (Signed)
   Covid-19 Vaccination Clinic  Name:  Becky Mcmahon    MRN: [id number] DOB: 02-01-1931  05/25/2019  [name] was observed post Covid-19 immunization for 30 minutes based on pre-vaccination screening without incidence. She was provided with Vaccine Information Sheet and instruction to access the V-Safe system.   [name] was instructed to call 911 with any severe reactions post vaccine: Marland Kitchen Difficulty breathing  . Swelling of your face and throat  . A fast heartbeat  . A bad rash all over your body  . Dizziness and weakness    Immunizations Administered    Name Date Dose VIS Date Route   Pfizer COVID-19 Vaccine 05/25/2019 11:38 AM 0.3 mL 04/21/2019 Intramuscular   Manufacturer: [place]   Lot: [id number]   Speculator: [id number]

## 2019-06-07 ENCOUNTER — Ambulatory Visit: Payer: Medicare Other | Admitting: Podiatry

## 2019-06-07 ENCOUNTER — Other Ambulatory Visit: Payer: Self-pay

## 2019-06-07 DIAGNOSIS — L97522 Non-pressure chronic ulcer of other part of left foot with fat layer exposed: Secondary | ICD-10-CM | POA: Diagnosis not present

## 2019-06-07 DIAGNOSIS — L97529 Non-pressure chronic ulcer of other part of left foot with unspecified severity: Secondary | ICD-10-CM

## 2019-06-07 DIAGNOSIS — I83025 Varicose veins of left lower extremity with ulcer other part of foot: Secondary | ICD-10-CM

## 2019-06-10 NOTE — Progress Notes (Signed)
Subjective:  86 y.o. female presenting today for follow up evaluation of an ulceration of the left lateral foot. She reports some intermittent soreness. She denies any worsening factors and has been applying Santyl daily as directed. Patient is here for further evaluation and treatment.    Past Medical History:  Diagnosis Date  . Anxiety   . Arthritis   . Breast cancer ([place])    Rt mastectomy  . CAD (coronary artery disease)   . Cerebral atherosclerosis    CAROTID DOPPLER, 06/18/2009 - RIGHT/LEFT BULB AND PROXIMAL ICAs-0-49% diameter reduction  . DVT (deep venous thrombosis) (HCC)    Recurrent, 1st episode after CABG 1997, 2nd episode after hip surgery 2001, placed on life-long coumadin ever since; LEXISCAN, 03/13/2008 - normal, no ECG changes, EKG negative for ischemia  . GERD (gastroesophageal reflux disease)   . Guaiac positive stools   . Hypercholesteremia   . Hyperlipidemia   . Hypertension   . Hypothyroidism   . Osteoporosis   . Ovarian cyst   . Pancreatitis   . 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 Severe aortic stenosis    2D ECHO, 04/22/2012 - EF 60-65%, aortic valve-heavily calcified with restricted leaflet motion, left atrium severly dilated  . Swelling of right lower extremity    LEA VENOUS DUPLEX SCAN, 08/18/2011 - no evidence of acute thrombus or thrombophlebitis  . Type II diabetes mellitus (HCC)    Diet controlled  . Vitamin D deficiency      Objective/Physical Exam General: The patient is alert and oriented x3 in no acute distress.  Dermatology:  Wound #1 noted to the to the left lateral foot measuring approximately 0.5 x 0.5 x 0.2 cm (LxWxD).   To the noted ulceration(s), there is no eschar. There is a moderate amount of slough, fibrin, and necrotic tissue noted. Granulation tissue and wound base is red. There is a minimal amount of  serosanguineous drainage noted. There is no exposed bone muscle-tendon ligament or joint. There is no malodor. Periwound integrity is intact. Skin is warm, dry and supple bilateral lower extremities.  Vascular: Palpable pedal pulses bilaterally. Mild edema noted. Capillary refill within normal limits. Varicosities noted bilateral lower extremities.   Neurological: Epicritic and protective threshold diminished bilaterally.   Musculoskeletal Exam: Range of motion within normal limits to all pedal and ankle joints bilateral. Muscle strength 5/5 in all groups bilateral.   Assessment: #1 ulceration of the left lateral foot secondary to venous insufficiency #2 varicosities bilateral lower extremities  Plan of Care:  #1 Patient was evaluated. #2 medically necessary excisional debridement including subcutaneous tissue was performed using a tissue nipper and a chisel blade. Excisional debridement of all the necrotic nonviable tissue down to healthy bleeding viable tissue was performed with post-debridement measurements same as pre-. #3 the wound was cleansed with normal saline and dry sterile dressing applied.  #4 Discontinue using Santyl.  #5 MediHoney provided to patient.  #6 Return to clinic in 4 weeks.     Edrick Kins, DPM [facility]  Dr. Edrick Kins, DPM    [address]  [place], [place] [address]                Office [phone]  Fax [phone]

## 2019-06-15 ENCOUNTER — Ambulatory Visit: Payer: Medicare Other | Attending: Internal Medicine

## 2019-06-15 DIAGNOSIS — Z23 Encounter for immunization: Secondary | ICD-10-CM | POA: Insufficient documentation

## 2019-06-15 NOTE — Progress Notes (Signed)
   Covid-19 Vaccination Clinic  Name:  DAQUISHA KOVALCIK    MRN: [id number] DOB: [birth year]  06/15/2019  Ms. Gerbitz was observed post Covid-19 immunization for 15 minutes without incidence. She was provided with Vaccine Information Sheet and instruction to access the V-Safe system.   Ms. Hollifield was instructed to call 911 with any severe reactions post vaccine: Marland Kitchen Difficulty breathing  . Swelling of your face and throat  . A fast heartbeat  . A bad rash all over your body  . Dizziness and weakness    Immunizations Administered    Name Date Dose VIS Date Route   Pfizer COVID-19 Vaccine 06/15/2019  9:44 AM 0.3 mL 04/21/2019 Intramuscular   Manufacturer: [employer]   Lot: [id number]   [place]: [id number]

## 2019-07-05 ENCOUNTER — Other Ambulatory Visit: Payer: Self-pay

## 2019-07-05 ENCOUNTER — Ambulatory Visit: Payer: Medicare Other | Admitting: Podiatry

## 2019-07-05 DIAGNOSIS — I83025 Varicose veins of left lower extremity with ulcer other part of foot: Secondary | ICD-10-CM

## 2019-07-05 DIAGNOSIS — L97529 Non-pressure chronic ulcer of other part of left foot with unspecified severity: Secondary | ICD-10-CM

## 2019-07-05 DIAGNOSIS — L97522 Non-pressure chronic ulcer of other part of left foot with fat layer exposed: Secondary | ICD-10-CM | POA: Diagnosis not present

## 2019-07-18 NOTE — Progress Notes (Signed)
Subjective:  86 y.o. female presenting today for follow up evaluation of an ulceration of the left lateral foot. She states the wound is relatively unchanged. She states her daughter cleans the wound, applies MediHoney and a sterile dressing daily. She reports continued soreness that is worsened with touching the area. Patient is here for further evaluation and treatment.    Past Medical History:  Diagnosis Date  . Anxiety   . Arthritis   . Breast cancer ([place])    Rt mastectomy  . CAD (coronary artery disease)   . Cerebral atherosclerosis    CAROTID DOPPLER, 06/18/2009 - RIGHT/LEFT BULB AND PROXIMAL ICAs-0-49% diameter reduction  . DVT (deep venous thrombosis) (HCC)    Recurrent, 1st episode after CABG 1997, 2nd episode after hip surgery 2001, placed on life-long coumadin ever since; LEXISCAN, 03/13/2008 - normal, no ECG changes, EKG negative for ischemia  . GERD (gastroesophageal reflux disease)   . Guaiac positive stools   . Hypercholesteremia   . Hyperlipidemia   . Hypertension   . Hypothyroidism   . Osteoporosis   . Ovarian cyst   . Pancreatitis   . 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 Severe aortic stenosis    2D ECHO, 04/22/2012 - EF 60-65%, aortic valve-heavily calcified with restricted leaflet motion, left atrium severly dilated  . Swelling of right lower extremity    LEA VENOUS DUPLEX SCAN, 08/18/2011 - no evidence of acute thrombus or thrombophlebitis  . Type II diabetes mellitus (HCC)    Diet controlled  . Vitamin D deficiency      Objective/Physical Exam General: The patient is alert and oriented x3 in no acute distress.  Dermatology:  Wound #1 noted to the to the left lateral foot measuring approximately 0.3 x 0.3 x 0.2 cm (LxWxD).   To the noted ulceration(s), there is no eschar. There is a moderate amount of slough, fibrin, and necrotic  tissue noted. Granulation tissue and wound base is red. There is a minimal amount of serosanguineous drainage noted. There is no exposed bone muscle-tendon ligament or joint. There is no malodor. Periwound integrity is intact. Skin is warm, dry and supple bilateral lower extremities.  Vascular: Palpable pedal pulses bilaterally. Mild edema noted. Capillary refill within normal limits. Varicosities noted bilateral lower extremities.   Neurological: Epicritic and protective threshold diminished bilaterally.   Musculoskeletal Exam: Range of motion within normal limits to all pedal and ankle joints bilateral. Muscle strength 5/5 in all groups bilateral.   Assessment: #1 ulceration of the left lateral foot secondary to venous insufficiency #2 varicosities bilateral lower extremities  Plan of Care:  #1 Patient was evaluated. #2 medically necessary excisional debridement including subcutaneous tissue was performed using a tissue nipper and a chisel blade. Excisional debridement of all the necrotic nonviable tissue down to healthy bleeding viable tissue was performed with post-debridement measurements same as pre-. #3 the wound was cleansed with normal saline and dry sterile dressing applied.  #4 Continue using MediHoney daily.  #5 Return to clinic in 3 weeks.      Edrick Kins, DPM [facility]  Dr. Edrick Kins, DPM    [address]  [place], [place] [address]                Office [phone]  Fax [phone]

## 2019-07-26 ENCOUNTER — Ambulatory Visit: Payer: Medicare Other | Admitting: Podiatry

## 2019-07-31 ENCOUNTER — Ambulatory Visit: Payer: Medicare Other | Admitting: Podiatry

## 2019-08-02 ENCOUNTER — Ambulatory Visit: Payer: Medicare Other | Admitting: Podiatry

## 2019-08-02 ENCOUNTER — Other Ambulatory Visit: Payer: Self-pay

## 2019-08-02 DIAGNOSIS — L97529 Non-pressure chronic ulcer of other part of left foot with unspecified severity: Secondary | ICD-10-CM

## 2019-08-02 DIAGNOSIS — L97522 Non-pressure chronic ulcer of other part of left foot with fat layer exposed: Secondary | ICD-10-CM

## 2019-08-02 DIAGNOSIS — R600 Localized edema: Secondary | ICD-10-CM | POA: Diagnosis not present

## 2019-08-02 DIAGNOSIS — I83025 Varicose veins of left lower extremity with ulcer other part of foot: Secondary | ICD-10-CM

## 2019-08-06 NOTE — Progress Notes (Signed)
Subjective:  86 y.o. female presenting today for follow up evaluation of an ulceration of the left lateral foot. She states she is doing well. She notes pain only when bearing weight and applying pressure to the foot. She has been using [place] on the wound and states it has been helping. Patient is here for further evaluation and treatment.   Past Medical History:  Diagnosis Date  . Anxiety   . Arthritis   . Breast cancer ([place])    Rt mastectomy  . CAD (coronary artery disease)   . Cerebral atherosclerosis    CAROTID DOPPLER, 06/18/2009 - RIGHT/LEFT BULB AND PROXIMAL ICAs-0-49% diameter reduction  . DVT (deep venous thrombosis) (HCC)    Recurrent, 1st episode after CABG 1997, 2nd episode after hip surgery 2001, placed on life-long coumadin ever since; LEXISCAN, 03/13/2008 - normal, no ECG changes, EKG negative for ischemia  . GERD (gastroesophageal reflux disease)   . Guaiac positive stools   . Hypercholesteremia   . Hyperlipidemia   . Hypertension   . Hypothyroidism   . Osteoporosis   . Ovarian cyst   . Pancreatitis   . 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 Severe aortic stenosis    2D ECHO, 04/22/2012 - EF 60-65%, aortic valve-heavily calcified with restricted leaflet motion, left atrium severly dilated  . Swelling of right lower extremity    LEA VENOUS DUPLEX SCAN, 08/18/2011 - no evidence of acute thrombus or thrombophlebitis  . Type II diabetes mellitus (HCC)    Diet controlled  . Vitamin D deficiency      Objective/Physical Exam General: The patient is alert and oriented x3 in no acute distress.  Dermatology:  Wound #1 noted to the to the left lateral foot measuring approximately 0.4 x 0.4 x 0.2 cm (LxWxD).   To the noted ulceration(s), there is no eschar. There is a moderate amount of slough, fibrin, and necrotic tissue noted. Granulation tissue  and wound base is red. There is a minimal amount of serosanguineous drainage noted. There is no exposed bone muscle-tendon ligament or joint. There is no malodor. Periwound integrity is intact. Skin is warm, dry and supple bilateral lower extremities.  Vascular: Palpable pedal pulses bilaterally. Edema noted to the left foot. Capillary refill within normal limits. Varicosities noted bilateral lower extremities.   Neurological: Epicritic and protective threshold diminished bilaterally.   Musculoskeletal Exam: Range of motion within normal limits to all pedal and ankle joints bilateral. Muscle strength 5/5 in all groups bilateral.   Assessment: #1 ulceration of the left lateral foot secondary to venous insufficiency #2 varicosities bilateral lower extremities #3 edema noted to the left foot   Plan of Care:  #1 Patient was evaluated. #2 medically necessary excisional debridement including subcutaneous tissue was performed using a tissue nipper and a chisel blade. Excisional debridement of all the necrotic nonviable tissue down to healthy bleeding viable tissue was performed with post-debridement measurements same as pre-. #3 the wound was cleansed with normal saline and dry sterile dressing applied.  #4 Unna boot applied. Keep clean, dry and intact for one week.  #5 Resume using MediHoney after one week.  #6 Return to clinic in 4 weeks.    Edrick Kins, DPM [facility]  Dr. Edrick Kins, DPM    [address]  [place], [place] [address]                Office [phone]  Fax [phone]

## 2019-08-23 ENCOUNTER — Emergency Department (HOSPITAL_COMMUNITY): Payer: Medicare Other

## 2019-08-23 ENCOUNTER — Emergency Department (HOSPITAL_COMMUNITY)
Admission: EM | Admit: 2019-08-23 | Discharge: 2019-09-09 | Disposition: E | Payer: Medicare Other | Attending: Emergency Medicine | Admitting: Emergency Medicine

## 2019-08-23 DIAGNOSIS — I469 Cardiac arrest, cause unspecified: Secondary | ICD-10-CM | POA: Diagnosis not present

## 2019-08-23 LAB — CBC
HCT: 36.4 % (ref 36.0–46.0)
Hemoglobin: 9.8 g/dL — ABNORMAL LOW (ref 12.0–15.0)
MCH: 26.6 pg (ref 26.0–34.0)
MCHC: 26.9 g/dL — ABNORMAL LOW (ref 30.0–36.0)
MCV: 98.9 fL (ref 80.0–100.0)
Platelets: 87 10*3/uL — ABNORMAL LOW (ref 150–400)
RBC: 3.68 MIL/uL — ABNORMAL LOW (ref 3.87–5.11)
RDW: 14.8 % (ref 11.5–15.5)
WBC: 18.4 10*3/uL — ABNORMAL HIGH (ref 4.0–10.5)
nRBC: 0.5 % — ABNORMAL HIGH (ref 0.0–0.2)

## 2019-08-23 LAB — I-STAT CHEM 8, ED
BUN: 55 mg/dL — ABNORMAL HIGH (ref 8–23)
Calcium, Ion: 1.15 mmol/L (ref 1.15–1.40)
Chloride: 118 mmol/L — ABNORMAL HIGH (ref 98–111)
Creatinine, Ser: 1.5 mg/dL — ABNORMAL HIGH (ref 0.44–1.00)
Glucose, Bld: 199 mg/dL — ABNORMAL HIGH (ref 70–99)
HCT: 28 % — ABNORMAL LOW (ref 36.0–46.0)
Hemoglobin: 9.5 g/dL — ABNORMAL LOW (ref 12.0–15.0)
Potassium: 4.7 mmol/L (ref 3.5–5.1)
Sodium: 145 mmol/L (ref 135–145)
TCO2: 10 mmol/L — ABNORMAL LOW (ref 22–32)

## 2019-08-23 LAB — TROPONIN I (HIGH SENSITIVITY): Troponin I (High Sensitivity): 146 ng/L (ref <18)

## 2019-08-23 MED ORDER — EPINEPHRINE HCL 5 MG/250ML IV SOLN IN NS: 0.5000 ug/min | INTRAVENOUS | Status: DC

## 2019-08-23 MED ORDER — MORPHINE SULFATE (PF) 4 MG/ML IV SOLN: 4.0000 mg | Freq: Once | INTRAVENOUS | Status: DC

## 2019-08-23 NOTE — ED Provider Notes (Addendum)
[facility] EMERGENCY DEPARTMENT Provider Note   CSN: [id number] Arrival date & time: 08/17/2019  2146     History Chief Complaint  Patient presents with   Cardiac Arrest    Becky Mcmahon is a 86 y.o. female.  HPI   Patient presented to the ED for evaluation after cardiac arrest.  Per EMS reports, patient was at home, she had been complaining of some leg pain.  She suddenly slumped over and became unresponsive.  Patient did not appear to be breathing.  EMS was called.  CPR was started at approximately 2038 hrs.  She had return of spontaneous circulation at 2045.  When paramedics arrived the patient was bradycardic and the began pacing.  She was changed from a King airway to an ET tube.  CPR restarted at 2054.  She had a PEA rhythm and then had return of spontaneous circulation at 2113.  Patient was placed on an epinephrine drip.  Patient remains unresponsive and is unable to provide any history  Past Medical History:  Diagnosis Date   Anxiety    Arthritis    Breast cancer ([place])    Rt mastectomy   CAD (coronary artery disease)    Cerebral atherosclerosis    CAROTID DOPPLER, 06/18/2009 - RIGHT/LEFT BULB AND PROXIMAL ICAs-0-49% diameter reduction   DVT (deep venous thrombosis) (HCC)    Recurrent, 1st episode after CABG 1997, 2nd episode after hip surgery 2001, placed on life-long coumadin ever since; LEXISCAN, 03/13/2008 - normal, no ECG changes, EKG negative for ischemia   GERD (gastroesophageal reflux disease)    Guaiac positive stools    Hypercholesteremia    Hyperlipidemia    Hypertension    Hypothyroidism    Osteoporosis    Ovarian cyst    Pancreatitis    S/P CABG x 5 10/25/1995   LIMA to LAD, sequential SVG to RI and OM, sequential SVG to RCA and PDA, open vein harvest from both thighs and legs - Dr Prescott Gum   Severe aortic stenosis    2D ECHO, 04/22/2012 - EF 60-65%, aortic valve-heavily calcified with restricted leaflet motion,  left atrium severly dilated   Swelling of right lower extremity    LEA VENOUS DUPLEX SCAN, 08/18/2011 - no evidence of acute thrombus or thrombophlebitis   Type II diabetes mellitus ([place])    Diet controlled   Vitamin D deficiency     Patient Active Problem List   Diagnosis Date Noted   Pleural effusion on right    Advanced care planning/counseling discussion    Acute bilateral thoracic back pain    Acute respiratory failure with hypoxia ([place]) 07/16/2018   Thoracic vertebral fracture ([place]) 07/15/2018   Severe sepsis with septic shock ([place]) 07/15/2018   Lactic acidosis 07/15/2018   AKI (acute kidney injury) ([place]) 07/15/2018   Elevated INR 07/15/2018   Recurrent right pleural effusion 07/15/2018   Palliative care by specialist    Hemorrhagic shocks ([place]) 07/08/2018   Pressure injury of skin 07/08/2018   Encephalopathy acute    S/P TAVR (transcatheter aortic valve replacement) 07/30/2015   Goals of care, counseling/discussion 02/17/2013   Breast cancer ([place]) 08/08/2012   GERD (gastroesophageal reflux disease) 08/08/2012   Hypothyroidism, unspecified 08/08/2012   Osteoporosis 08/08/2012   Aortic valve disorders 05/27/2012   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[place]) 05/27/2012   Arthritis 05/27/2012   HTN (hypertension) 05/27/2012   Hyperlipidemia, unspecified 05/27/2012   DVT (deep venous thrombosis) (HCC)    AS (aortic stenosis) 05/24/2012  CAD (coronary artery disease) 05/24/2012   S/P CABG x 5 10/25/1995    Past Surgical History:  Procedure Laterality Date   AORTIC VALVE REPLACEMENT     CARDIOLITE MYOCARDIAL PERFUSION STUDY  06/13/2004   Normal static and dynamic myocardial perfusion images with EF of 73%   CORONARY ARTERY BYPASS GRAFT  10/25/1995   x5 , Dr Prescott Gum   HIP ARTHROPLASTY     right   IR THORACENTESIS ASP PLEURAL SPACE W/IMG GUIDE  07/15/2018   LAPAROTOMY     LEFT AND RIGHT HEART CATHETERIZATION WITH CORONARY/GRAFT ANGIOGRAM N/A  05/23/2012   Procedure: LEFT AND RIGHT HEART CATHETERIZATION WITH Beatrix Fetters;  Surgeon: Sanda Klein, MD;  Location: [facility] CATH [facility];  Service: Cardiovascular;  Laterality: N/A;   right mastectomy  1979     OB History   No obstetric history on file.     Family History  Problem Relation Age of Onset   Pancreatitis Mother    Heart disease Father        family HX   Heart disease Sister    Heart disease Brother    Heart disease Brother 48   Heart disease Sister 76    Social History   Tobacco Use   Smoking status: Former Smoker    Packs/day: 0.50    Years: 19.00    Pack years: 9.50    Quit date: 05/24/1969    Years since quitting: 50.2   Smokeless tobacco: Never Used  Substance Use Topics   Alcohol use: No   Drug use: No    Home Medications Prior to Admission medications   Medication Sig Start Date End Date Taking? Authorizing Provider  Ascorbic Acid (VITAMIN C) 1000 MG tablet Take 1 tablet (1,000 mg total) by mouth daily. 07/19/18   Desiree Hane, MD  atorvastatin (LIPITOR) 20 MG tablet Take 1 tablet (20 mg total) by mouth every evening. 07/19/18   Desiree Hane, MD  cetirizine (ZYRTEC) 10 MG tablet Take 1 tablet (10 mg total) by mouth daily. 07/19/18   Oretha Milch D, MD  Cholecalciferol (VITAMIN D) 50 MCG (2000 UT) tablet Take 1 tablet (2,000 Units total) by mouth every evening. 07/19/18   Desiree Hane, MD  collagenase (SANTYL) ointment Apply 1 application topically daily. 05/22/19   Edrick Kins, DPM  donepezil (ARICEPT) 5 MG tablet Take 1 tablet (5 mg total) by mouth at bedtime. 07/19/18   Desiree Hane, MD  fluticasone (FLONASE) 50 MCG/ACT nasal spray Place 1 spray into both nostrils daily. 07/19/18   Desiree Hane, MD  levothyroxine (SYNTHROID, LEVOTHROID) 50 MCG tablet Take 1 tablet (50 mcg total) by mouth daily before breakfast. 07/19/18   Oretha Milch D, MD  lidocaine (LIDODERM) 5 % Place 2 patches onto the skin daily. Remove &  Discard patch within 12 hours or as directed by MD 07/20/18   Desiree Hane, MD  lisinopril (PRINIVIL,ZESTRIL) 10 MG tablet Take 1 tablet (10 mg total) by mouth daily. 07/19/18   Desiree Hane, MD  LORazepam (ATIVAN) 0.5 MG tablet Take 0.5 tablets (0.25 mg total) by mouth 2 (two) times daily as needed for anxiety. Takes at lunchtime and bedtime. 07/19/18   Desiree Hane, MD  pantoprazole (PROTONIX) 40 MG tablet Take 1 tablet (40 mg total) by mouth 2 (two) times daily before a meal. 07/19/18   Oretha Milch D, MD  tiZANidine (ZANAFLEX) 4 MG capsule Take 1 capsule (4 mg total) by mouth 3 (three)  times daily as needed for muscle spasms. 07/19/18   Oretha Milch D, MD  warfarin (COUMADIN) 5 MG tablet 5 mg Mon, Wed, Fri 2.5 mg tues, thurs, Sat, Sun 07/19/18   Oretha Milch D, MD    Allergies    Contrast media  [iodinated diagnostic agents], Keflex [cephalexin], Latex, and Neosporin [neomycin-bacitracin zn-polymyx]  Review of Systems   Review of Systems  Unable to perform ROS: Acuity of condition    Physical Exam Updated Vital Signs BP (!) 58/14    Resp (!) 22    SpO2 99%   Physical Exam Constitutional:      Appearance: She is ill-appearing.  HENT:     Head: Normocephalic and atraumatic.     Right Ear: External ear normal.     Left Ear: External ear normal.     Nose: Nose normal.     Mouth/Throat:     Comments: Emesis in oropharynx Eyes:     Pupils: Pupils are equal, round, and reactive to light.  Neck:     Comments: No mass  Cardiovascular:     Rate and Rhythm: Normal rate and regular rhythm.  Pulmonary:     Comments: Equal breath sounds with ET tube and ventilator Abdominal:     General: Abdomen is flat.     Tenderness: There is no abdominal tenderness.  Musculoskeletal:        General: No deformity.  Skin:    Coloration: Skin is pale.     Comments: cool  Neurological:     GCS: GCS eye subscore is 1. GCS verbal subscore is 1. GCS motor subscore is 1.     ED  Results / Procedures / Treatments   Labs (all labs ordered are listed, but only abnormal results are displayed) Labs Reviewed  I-STAT CHEM 8, ED - Abnormal; Notable for the following components:      Result Value   Chloride 118 (*)    BUN 55 (*)    Creatinine, Ser 1.50 (*)    Glucose, Bld 199 (*)    TCO2 10 (*)    Hemoglobin 9.5 (*)    HCT 28.0 (*)    All other components within normal limits  CULTURE, BLOOD (ROUTINE X 2)  CULTURE, BLOOD (ROUTINE X 2)  CBC  COMPREHENSIVE METABOLIC PANEL  LACTIC ACID, PLASMA  LACTIC ACID, PLASMA  URINALYSIS, ROUTINE W REFLEX MICROSCOPIC  I-STAT ARTERIAL BLOOD GAS, ED  TROPONIN I (HIGH SENSITIVITY)    EKG EKG Interpretation  Date/Time:  Wednesday August 23 2019 21:50:10 EDT Ventricular Rate:  111 PR Interval:    QRS Duration: 81 QT Interval:  318 QTC Calculation: 433 R Axis:   -64 Text Interpretation: Sinus tachycardia Abnormal R-wave progression, early transition Inferior infarct, old Repol abnrm suggests ischemia, diffuse leads, new since last tracing Confirmed by Dorie Rank [phone]) on 09/04/2019 10:22:47 PM   Radiology No results found.  Procedures .Critical Care Performed by: Dorie Rank, MD Authorized by: Dorie Rank, MD   Critical care provider statement:    Critical care time (minutes):  30   Critical care was time spent personally by me on the following activities:  Discussions with consultants, evaluation of patient's response to treatment, examination of patient, ordering and performing treatments and interventions, ordering and review of laboratory studies, ordering and review of radiographic studies, pulse oximetry, re-evaluation of patient's condition, obtaining history from patient or surrogate and review of old charts   (including critical care time)  Medications Ordered in ED Medications  EPINEPHrine (ADRENALIN) 4 mg in NS 250 mL (0.016 mg/mL) premix infusion (0 mcg/min Intravenous Hold 08/10/2019 2211)  morphine 4 MG/ML  injection 4 mg (has no administration in time range)    ED Course  I have reviewed the triage vital signs and the nursing notes.  Pertinent labs & imaging results that were available during my care of the patient were reviewed by me and considered in my medical decision making (see chart for details).  Clinical Course as of Aug 22 2221  Wed Aug 23, 2019  2219 Patient has become more bradycardic despite epinephrine drip.  She was profoundly hypotensive despite supplemental pressors.   [id number] I had a discussion with the patient's daughter.  Her prognosis is very poor.  We will transition to comfort care.   [JK]    Clinical Course User Index [JK] Dorie Rank, MD   MDM Rules/Calculators/A&P                      Patient presented to the ED after cardiac arrest.  Patient had appropriate ACLS care.  Despite interventions she had prolonged CPR.  In the ED the patient is unresponsive.  She has become more hypotensive and bradycardic.  Cardiology also consulted on arrival because of the initial postarrest EKG suggesting possible ST elevation MI.  Repeat EKG is less worrisome.  Patient has a extremely poor prognosis.  She is becoming more hypotensive and bradycardic despite efforts.  After speaking with the patient's daughter we will transition to comfort care.  Discontinue the epinephrine drip and the ventilator. Final Clinical Impression(s) / ED Diagnoses Final diagnoses:  Cardiac arrest [facility])      Dorie Rank, MD 08/31/2019 2223  Cc addendum    Dorie Rank, MD 09/02/19 1000

## 2019-08-23 NOTE — Progress Notes (Signed)
Chaplain responded to page to ED to attend family at bedside of Becky Mcmahon who had just been pronounced deceased.  Stayed with family until their pastor arrived.  Invited family to have Chaplain paged if further need arose.  De Burrs Chaplain Resident

## 2019-08-23 NOTE — ED Notes (Signed)
Pt asystolic on the monitor, Dr. Tomi Bamberger and family at bedside.

## 2019-08-23 NOTE — ED Triage Notes (Signed)
Pt BIB [facility] from home, witnessed cardiac arrest. Per family, pt complaining about bilateral leg pain. On fire arrival, pt pulseless, CPR started at 2038, ROSC at 2045. On EMS arrival, pt bradycardic, EMS began pacing, pt then went into PEA. CPR restarted at 2054, PEA rhythm, return of ROSC at 2113. Pulses present on arrival to ED, c-collar placed PTA.

## 2019-08-23 NOTE — Consult Note (Signed)
    I was present in the emergency room upon the arrival of Becky Mcmahon.  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 Just last year there is a palliative care consult to discuss goals of care. She was maintained as full code, despite her significant abilities.  Per EMS, this was a witnessed arrest. Patient had been complaining of just not feeling well for the last several days, and then today was complaining about bilateral leg pain. She stood up, and by report collapsed. There was no complaint of chest pain, pressure or dyspnea. Upon fire first response arrival, she is found to be pulseless in PEA arrest. After about 7 min of CPR [id number]) they were able to get ROSC. Upon medic arrival again began to bradycardia down despite percutaneous pacing. She is placed on epinephrine drip. King airway was changed for a ETT. However despite this she did go back into PEA and had CPR reinitiated (2054). They again restored ROSC at 2113. Upon arrival of EMS medic, she was placed on mechanical CPR device and was on epi drip. Initial EKG by EMS suggested possible inferior ST elevations with reciprocal changes in anterolateral leads. Code STEMI was called.  Upon arrival to ER she has somewhat labile pressures but palpable femoral pulses. She is intubated and has been sedated. Pupils are somewhat reactive. Follow-up EKG no longer shows ST elevations in inferior leads suggesting that the initial post ROSC EKG was related to diffuse ischemia.   She is a frail cachectic elderly woman with significant kyphoscoliosis. She is post TAVR and CABG and is now had 2 prolonged episodes of CPR. Based on her fragility and comorbidities, she would be a poor candidate for catheterization. However at this point it is unclear if she will have any chance of meaningful neurologic  recovery.  With no clear evidence of ST elevations on repeat EKG, we will cancel code STEMI. Recommend stabilization and to see if there is any sign of neurologic recovery. We will defer the decision of cooling protocol onto EDP and PCCM.  Until we are sure of her INR level, I see no clear indication for cardiac catheterization as her presentation did not sound to be cardiac in nature with no evidence of chest pain or pressure or dyspnea preceding her arrest.    Becky Mcmahon, M.D., M.S. Interventional Cardiologist

## 2019-08-24 LAB — COMPREHENSIVE METABOLIC PANEL
ALT: 216 U/L — ABNORMAL HIGH (ref 0–44)
AST: 312 U/L — ABNORMAL HIGH (ref 15–41)
Albumin: 2.5 g/dL — ABNORMAL LOW (ref 3.5–5.0)
Alkaline Phosphatase: 97 U/L (ref 38–126)
BUN: 50 mg/dL — ABNORMAL HIGH (ref 8–23)
CO2: <7 mmol/L — ABNORMAL LOW (ref 22–32)
Calcium: 8.8 mg/dL — ABNORMAL LOW (ref 8.9–10.3)
Chloride: 116 mmol/L — ABNORMAL HIGH (ref 98–111)
Creatinine, Ser: 1.84 mg/dL — ABNORMAL HIGH (ref 0.44–1.00)
GFR calc Af Amer: 28 mL/min — ABNORMAL LOW (ref >60)
GFR calc non Af Amer: 24 mL/min — ABNORMAL LOW (ref >60)
Glucose, Bld: 212 mg/dL — ABNORMAL HIGH (ref 70–99)
Potassium: 4.9 mmol/L (ref 3.5–5.1)
Sodium: 146 mmol/L — ABNORMAL HIGH (ref 135–145)
Total Bilirubin: 0.5 mg/dL (ref 0.3–1.2)
Total Protein: 5 g/dL — ABNORMAL LOW (ref 6.5–8.1)

## 2019-08-30 ENCOUNTER — Ambulatory Visit: Payer: Medicare Other | Admitting: Podiatry

## 2019-09-09 NOTE — Progress Notes (Signed)
Chaplain checked back in with family.  They reported being ready to leave the hospital.  Chaplain assured Mr. Niskanen the hospital would take loving care of her and she would be safe here.  Daughter Genel Kucera advised family would like staff to contact Richrd Humbles for funeral arrangements.  Chaplain taped note with Ms. Galan's phone number to monitor at [facility].  Chaplain escorted family out of ED.  De Burrs Chaplain Resident

## 2019-09-09 DEATH — deceased

## 2020-02-28 IMAGING — CR DG RIBS W/ CHEST 3+V BILAT
7 series · 7 of 7 positions shown · non-contrast
Comparison: Chest x-ray of April 07, 2016

CLINICAL DATA: Bilateral chest wall pain since a fall 2 weeks ago
after slipping at home.

EXAM:
BILATERAL RIBS AND CHEST - 4+ VIEW

[x chest ap (1 of 7)]
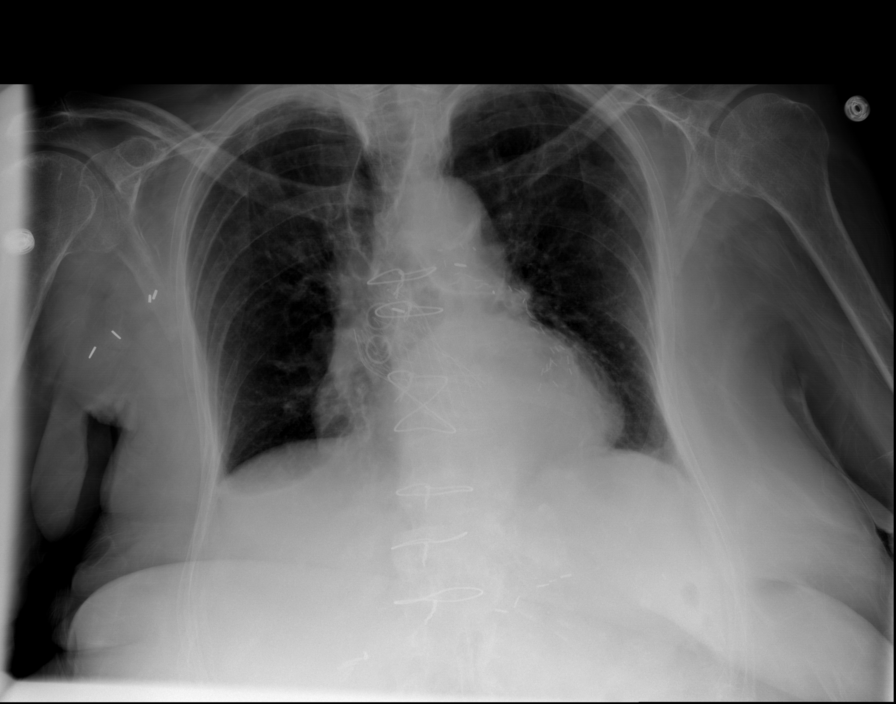

[x chest ap (2 of 7)]
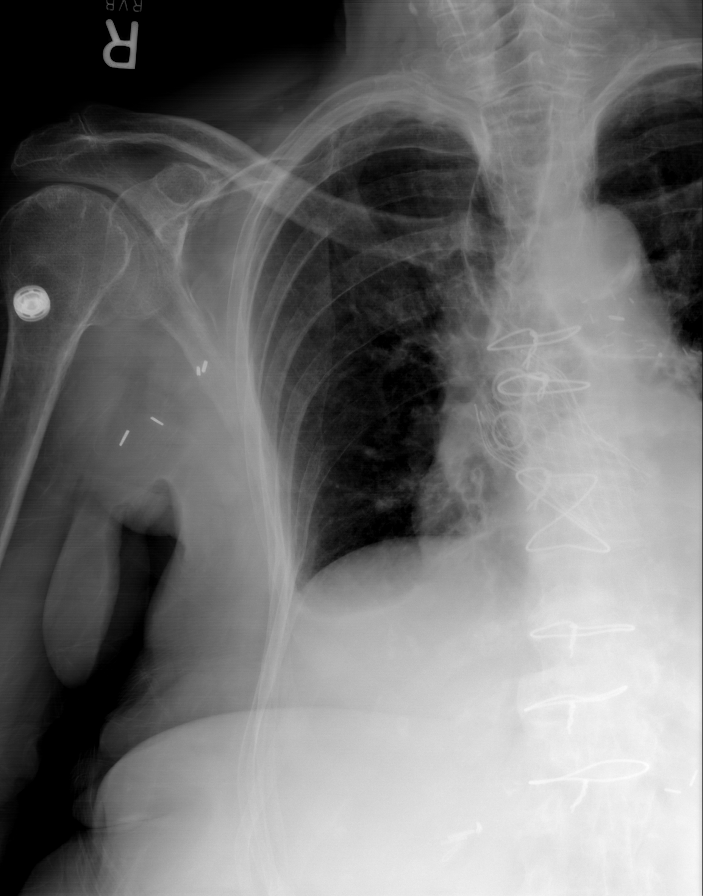

[x chest ap (3 of 7)]
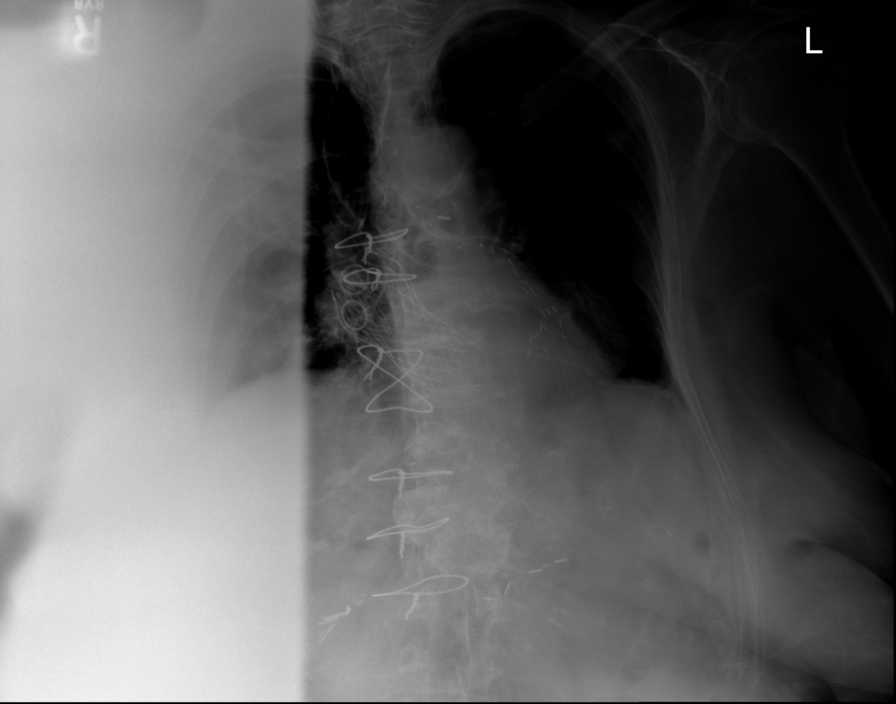

[x chest ap (4 of 7)]
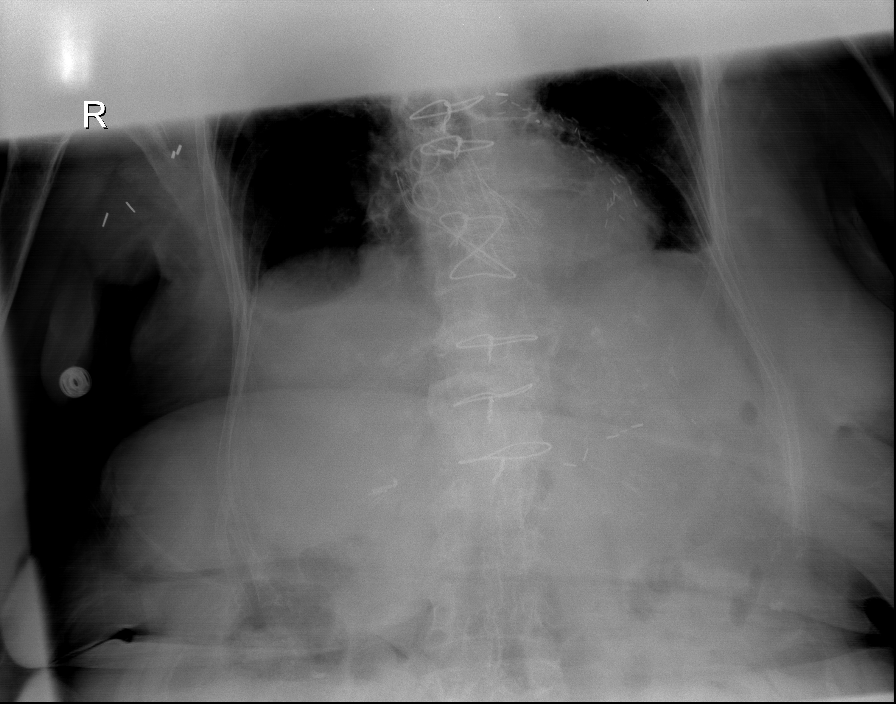

[x chest ap (5 of 7)]
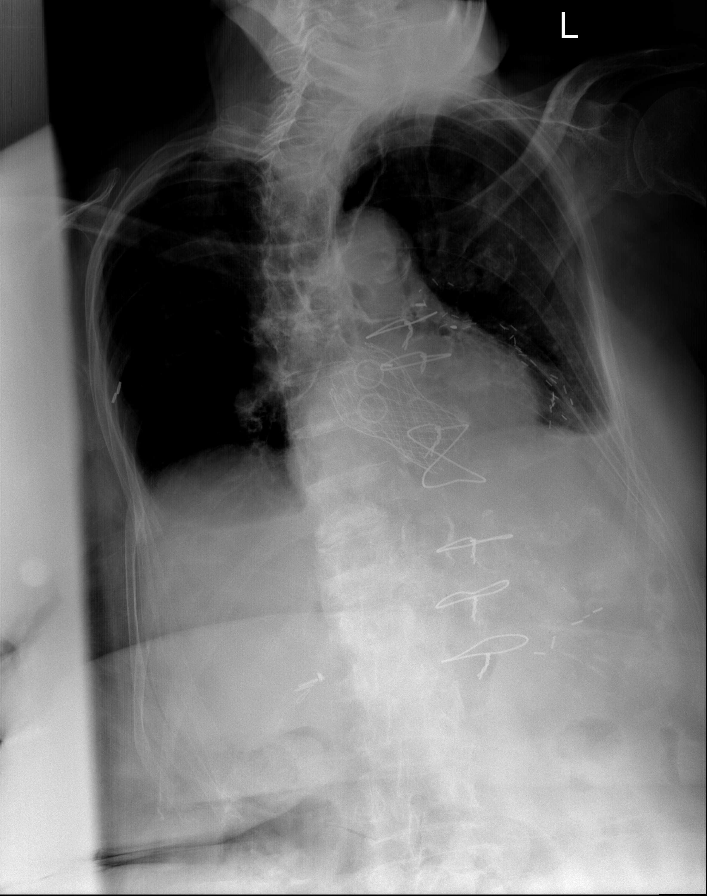

[x chest ap (6 of 7)]
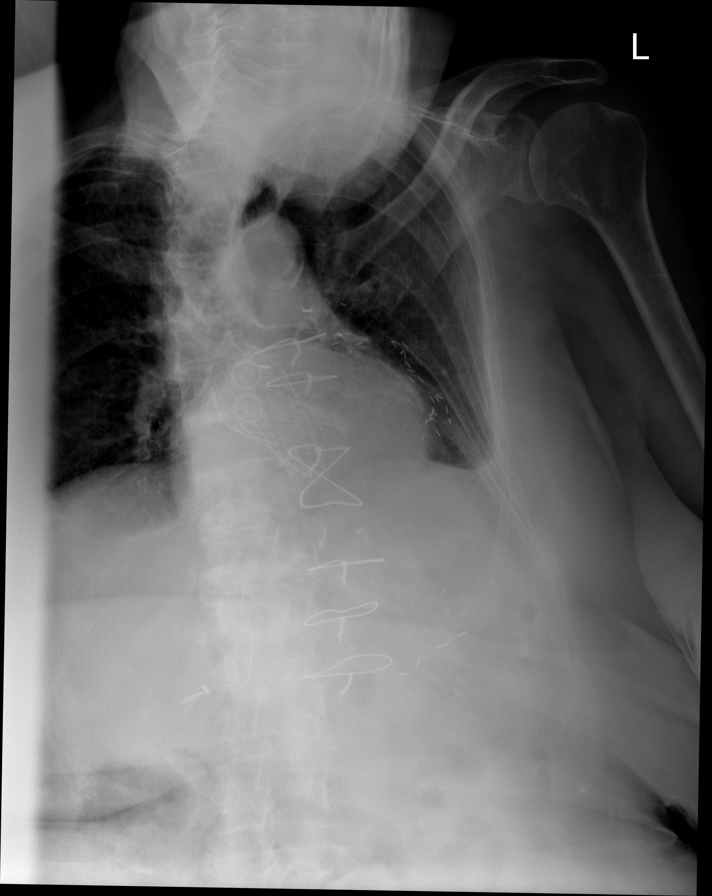

[x chest ap (7 of 7)]
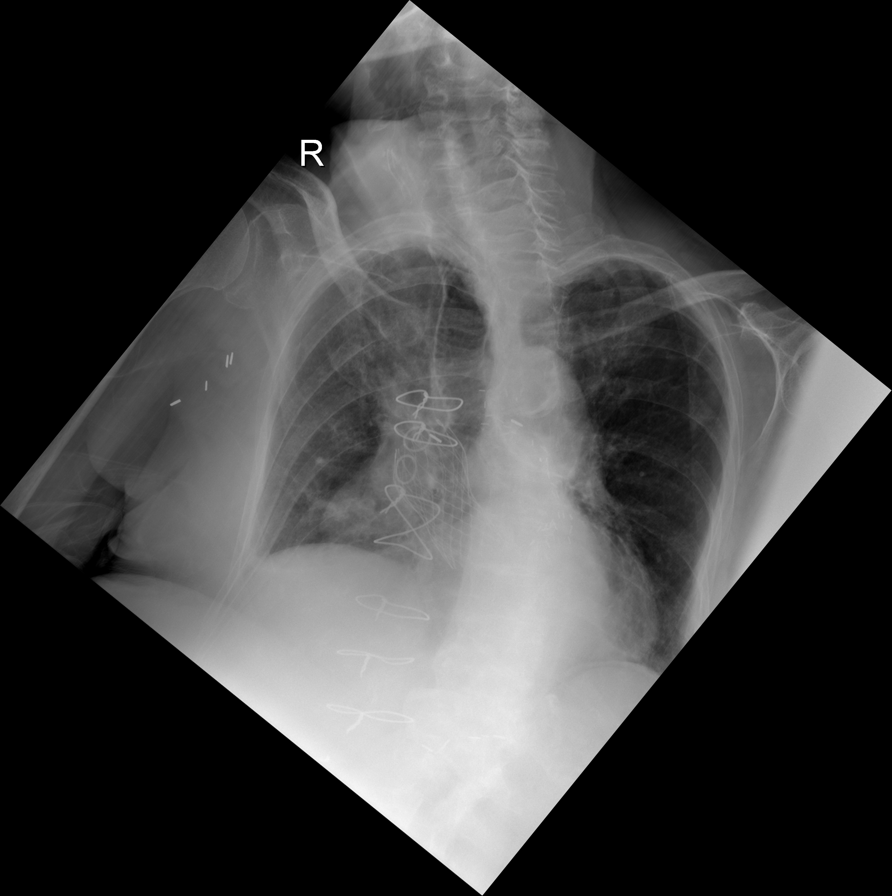

[7 of 7 positions shown; findings below may reference images not displayed]

FINDINGS: The lungs are adequately inflated. There is a small right pleural
effusion. There is no alveolar infiltrate. The patient has undergone
previous CABG and ascending aortic stent graft placement. There is
calcification in the wall of the aortic arch.

Rib detail images reveal no definite acute displaced fractures.
However, the images are limited due to the patient's clinical
condition and body habitus and due to osteopenia. Old deformity of
the lateral aspect of the right seventh rib is demonstrated.
IMPRESSION: No definite acute displaced rib fractures. There is old deformity of
the lateral aspect of the right seventh rib.

Chronic bronchitic changes, stable. Previous CABG and ascending
aortic stent graft placement. No overt CHF.
